# Patient Record
Sex: Female | Born: 1970 | Race: White | Hispanic: No | Marital: Single
Health system: Southern US, Community
[De-identification: ages and names within clinical notes are randomized; demographics above are authoritative.]

## PROBLEM LIST (undated history)

## (undated) ENCOUNTER — Emergency Department: Discharge status: Absent without leave | Payer: Medicare Other

## (undated) DIAGNOSIS — F32A Depression, unspecified: Secondary | ICD-10-CM

## (undated) DIAGNOSIS — Z789 Other specified health status: Secondary | ICD-10-CM

## (undated) DIAGNOSIS — I639 Cerebral infarction, unspecified: Secondary | ICD-10-CM

## (undated) DIAGNOSIS — J45909 Unspecified asthma, uncomplicated: Secondary | ICD-10-CM

## (undated) DIAGNOSIS — J449 Chronic obstructive pulmonary disease, unspecified: Secondary | ICD-10-CM

## (undated) DIAGNOSIS — F17200 Nicotine dependence, unspecified, uncomplicated: Secondary | ICD-10-CM

## (undated) DIAGNOSIS — K219 Gastro-esophageal reflux disease without esophagitis: Secondary | ICD-10-CM

## (undated) DIAGNOSIS — F419 Anxiety disorder, unspecified: Secondary | ICD-10-CM

## (undated) DIAGNOSIS — Z8619 Personal history of other infectious and parasitic diseases: Secondary | ICD-10-CM

## (undated) DIAGNOSIS — R569 Unspecified convulsions: Secondary | ICD-10-CM

## (undated) HISTORY — DX: Personal history of other infectious and parasitic diseases: Z86.19

## (undated) HISTORY — PX: SHOULDER SURGERY: SHX246

## (undated) HISTORY — PX: FOOT SURGERY: SHX648

## (undated) HISTORY — DX: Gastro-esophageal reflux disease without esophagitis: K21.9

## (undated) HISTORY — PX: CHOLECYSTECTOMY: SHX55

---

## 2008-08-18 ENCOUNTER — Emergency Department: Payer: Self-pay | Admitting: Emergency Medicine

## 2008-08-21 ENCOUNTER — Emergency Department: Payer: Self-pay | Admitting: Emergency Medicine

## 2012-03-09 DIAGNOSIS — I6381 Other cerebral infarction due to occlusion or stenosis of small artery: Secondary | ICD-10-CM | POA: Insufficient documentation

## 2012-03-09 DIAGNOSIS — M62469 Contracture of muscle, unspecified lower leg: Secondary | ICD-10-CM | POA: Insufficient documentation

## 2012-04-22 DIAGNOSIS — M216X9 Other acquired deformities of unspecified foot: Secondary | ICD-10-CM | POA: Insufficient documentation

## 2013-03-29 DIAGNOSIS — R9431 Abnormal electrocardiogram [ECG] [EKG]: Secondary | ICD-10-CM | POA: Insufficient documentation

## 2013-09-13 DIAGNOSIS — I7771 Dissection of carotid artery: Secondary | ICD-10-CM | POA: Insufficient documentation

## 2013-09-13 DIAGNOSIS — G40409 Other generalized epilepsy and epileptic syndromes, not intractable, without status epilepticus: Secondary | ICD-10-CM | POA: Insufficient documentation

## 2013-12-26 DIAGNOSIS — M8580 Other specified disorders of bone density and structure, unspecified site: Secondary | ICD-10-CM | POA: Insufficient documentation

## 2014-05-06 DIAGNOSIS — F3162 Bipolar disorder, current episode mixed, moderate: Secondary | ICD-10-CM | POA: Insufficient documentation

## 2014-06-17 DIAGNOSIS — T50902A Poisoning by unspecified drugs, medicaments and biological substances, intentional self-harm, initial encounter: Secondary | ICD-10-CM | POA: Insufficient documentation

## 2015-07-24 DIAGNOSIS — M21372 Foot drop, left foot: Secondary | ICD-10-CM | POA: Insufficient documentation

## 2015-08-19 DIAGNOSIS — F17200 Nicotine dependence, unspecified, uncomplicated: Secondary | ICD-10-CM | POA: Insufficient documentation

## 2015-12-13 DIAGNOSIS — F603 Borderline personality disorder: Secondary | ICD-10-CM | POA: Insufficient documentation

## 2015-12-21 DIAGNOSIS — D509 Iron deficiency anemia, unspecified: Secondary | ICD-10-CM | POA: Insufficient documentation

## 2016-01-14 DIAGNOSIS — I82621 Acute embolism and thrombosis of deep veins of right upper extremity: Secondary | ICD-10-CM | POA: Insufficient documentation

## 2016-01-14 DIAGNOSIS — R911 Solitary pulmonary nodule: Secondary | ICD-10-CM | POA: Insufficient documentation

## 2016-01-14 DIAGNOSIS — E041 Nontoxic single thyroid nodule: Secondary | ICD-10-CM | POA: Insufficient documentation

## 2016-04-12 DIAGNOSIS — I6930 Unspecified sequelae of cerebral infarction: Secondary | ICD-10-CM | POA: Insufficient documentation

## 2016-06-16 DIAGNOSIS — R7303 Prediabetes: Secondary | ICD-10-CM | POA: Insufficient documentation

## 2016-11-13 DIAGNOSIS — I2699 Other pulmonary embolism without acute cor pulmonale: Secondary | ICD-10-CM | POA: Insufficient documentation

## 2016-11-20 DIAGNOSIS — F1420 Cocaine dependence, uncomplicated: Secondary | ICD-10-CM | POA: Insufficient documentation

## 2019-04-21 ENCOUNTER — Other Ambulatory Visit: Payer: Self-pay

## 2019-04-21 ENCOUNTER — Emergency Department
Admission: EM | Admit: 2019-04-21 | Discharge: 2019-04-21 | Disposition: A | Discharge status: Home / Self Care | Payer: Medicare Other | Attending: Emergency Medicine | Admitting: Emergency Medicine

## 2019-04-21 ENCOUNTER — Encounter: Payer: Self-pay | Admitting: Emergency Medicine

## 2019-04-21 ENCOUNTER — Emergency Department: Payer: Medicare Other

## 2019-04-21 DIAGNOSIS — F1290 Cannabis use, unspecified, uncomplicated: Secondary | ICD-10-CM

## 2019-04-21 DIAGNOSIS — Z20828 Contact with and (suspected) exposure to other viral communicable diseases: Secondary | ICD-10-CM | POA: Insufficient documentation

## 2019-04-21 DIAGNOSIS — R569 Unspecified convulsions: Secondary | ICD-10-CM

## 2019-04-21 DIAGNOSIS — F1210 Cannabis abuse, uncomplicated: Secondary | ICD-10-CM | POA: Diagnosis not present

## 2019-04-21 HISTORY — DX: Unspecified convulsions: R56.9

## 2019-04-21 HISTORY — DX: Chronic obstructive pulmonary disease, unspecified: J44.9

## 2019-04-21 LAB — URINE DRUG SCREEN, QUALITATIVE (ARMC ONLY)
Amphetamines, Ur Screen: NOT DETECTED
Barbiturates, Ur Screen: NOT DETECTED
Benzodiazepine, Ur Scrn: NOT DETECTED
Cannabinoid 50 Ng, Ur ~~LOC~~: POSITIVE — AB
Cocaine Metabolite,Ur ~~LOC~~: NOT DETECTED
MDMA (Ecstasy)Ur Screen: NOT DETECTED
Methadone Scn, Ur: NOT DETECTED
Opiate, Ur Screen: NOT DETECTED
Phencyclidine (PCP) Ur S: NOT DETECTED
Tricyclic, Ur Screen: POSITIVE — AB

## 2019-04-21 LAB — CBC WITH DIFFERENTIAL/PLATELET
Abs Immature Granulocytes: 0.04 10*3/uL (ref 0.00–0.07)
Basophils Absolute: 0.1 10*3/uL (ref 0.0–0.1)
Basophils Relative: 1 %
Eosinophils Absolute: 0.2 10*3/uL (ref 0.0–0.5)
Eosinophils Relative: 3 %
HCT: 42 % (ref 36.0–46.0)
Hemoglobin: 14 g/dL (ref 12.0–15.0)
Immature Granulocytes: 0 %
Lymphocytes Relative: 42 %
Lymphs Abs: 3.9 10*3/uL (ref 0.7–4.0)
MCH: 31.3 pg (ref 26.0–34.0)
MCHC: 33.3 g/dL (ref 30.0–36.0)
MCV: 93.8 fL (ref 80.0–100.0)
Monocytes Absolute: 0.8 10*3/uL (ref 0.1–1.0)
Monocytes Relative: 8 %
Neutro Abs: 4.2 10*3/uL (ref 1.7–7.7)
Neutrophils Relative %: 46 %
Platelets: 310 10*3/uL (ref 150–400)
RBC: 4.48 MIL/uL (ref 3.87–5.11)
RDW: 15.7 % — ABNORMAL HIGH (ref 11.5–15.5)
WBC: 9.2 10*3/uL (ref 4.0–10.5)
nRBC: 0 % (ref 0.0–0.2)

## 2019-04-21 LAB — URINALYSIS, COMPLETE (UACMP) WITH MICROSCOPIC
Bacteria, UA: NONE SEEN
Bilirubin Urine: NEGATIVE
Glucose, UA: NEGATIVE mg/dL
Hgb urine dipstick: NEGATIVE
Ketones, ur: NEGATIVE mg/dL
Leukocytes,Ua: NEGATIVE
Nitrite: NEGATIVE
Protein, ur: 30 mg/dL — AB
Specific Gravity, Urine: 1.02 (ref 1.005–1.030)
pH: 6 (ref 5.0–8.0)

## 2019-04-21 LAB — COMPREHENSIVE METABOLIC PANEL
ALT: 10 U/L (ref 0–44)
AST: 22 U/L (ref 15–41)
Albumin: 3.4 g/dL — ABNORMAL LOW (ref 3.5–5.0)
Alkaline Phosphatase: 88 U/L (ref 38–126)
Anion gap: 8 (ref 5–15)
BUN: 12 mg/dL (ref 6–20)
CO2: 27 mmol/L (ref 22–32)
Calcium: 8.8 mg/dL — ABNORMAL LOW (ref 8.9–10.3)
Chloride: 105 mmol/L (ref 98–111)
Creatinine, Ser: 0.9 mg/dL (ref 0.44–1.00)
GFR calc Af Amer: >60 mL/min (ref >60)
GFR calc non Af Amer: >60 mL/min (ref >60)
Glucose, Bld: 117 mg/dL — ABNORMAL HIGH (ref 70–99)
Potassium: 4 mmol/L (ref 3.5–5.1)
Sodium: 140 mmol/L (ref 135–145)
Total Bilirubin: 0.4 mg/dL (ref 0.3–1.2)
Total Protein: 6.9 g/dL (ref 6.5–8.1)

## 2019-04-21 LAB — SARS CORONAVIRUS 2 (TAT 6-24 HRS): SARS Coronavirus 2: NEGATIVE

## 2019-04-21 LAB — SALICYLATE LEVEL: Salicylate Lvl: <7 mg/dL (ref 2.8–30.0)

## 2019-04-21 LAB — VALPROIC ACID LEVEL: Valproic Acid Lvl: <10 ug/mL — ABNORMAL LOW (ref 50.0–100.0)

## 2019-04-21 LAB — ACETAMINOPHEN LEVEL: Acetaminophen (Tylenol), Serum: <10 ug/mL — ABNORMAL LOW (ref 10–30)

## 2019-04-21 LAB — CK: Total CK: 75 U/L (ref 38–234)

## 2019-04-21 LAB — ETHANOL: Alcohol, Ethyl (B): <10 mg/dL (ref <10)

## 2019-04-21 MED ORDER — DIVALPROEX SODIUM 500 MG PO DR TAB: 1000.0000 mg | DELAYED_RELEASE_TABLET | Freq: Two times a day (BID) | ORAL | 0 refills | Status: DC

## 2019-04-21 MED ORDER — SODIUM CHLORIDE 0.9 % IV BOLUS
1000.0000 mL | Freq: Once | INTRAVENOUS | Status: AC
  Administered 2019-04-21: 1000 mL via INTRAVENOUS

## 2019-04-21 MED ORDER — DIVALPROEX SODIUM 500 MG PO DR TAB
1000.0000 mg | DELAYED_RELEASE_TABLET | Freq: Once | ORAL | Status: AC
  Administered 2019-04-21: 12:00:00 1000 mg via ORAL
  Filled 2019-04-21: qty 2

## 2019-04-21 MED ORDER — LORAZEPAM 2 MG/ML IJ SOLN
1.0000 mg | Freq: Once | INTRAMUSCULAR | Status: AC
  Administered 2019-04-21: 01:00:00 1 mg via INTRAVENOUS

## 2019-04-21 MED ORDER — VALPROATE SODIUM 500 MG/5ML IV SOLN
250.0000 mg | Freq: Once | INTRAVENOUS | Status: AC
  Administered 2019-04-21: 250 mg via INTRAVENOUS
  Filled 2019-04-21: qty 2.5

## 2019-04-21 NOTE — ED Notes (Signed)
Pt back from CT, still in a postictal state but does respond to pain.

## 2019-04-21 NOTE — ED Notes (Signed)
This RN and Gregor Hams helped pt to bathroom. Pt also dressed in own clothes. Pt states her mother will be here in an hour to get her. Pt IV left in place at this time while monitoring.

## 2019-04-21 NOTE — ED Notes (Signed)
Pt to CT

## 2019-04-21 NOTE — ED Notes (Signed)
Soon after arrival to the ED pt had tonic clonic seizure lasting a few seconds. PT placed on side with suction and oxygen applied. MD at bedside, meds given.

## 2019-04-21 NOTE — ED Provider Notes (Signed)
[facility] Emergency Department Provider Note   ____________________________________________   First MD Initiated Contact with Patient 04/21/19 0103     (approximate)  I have reviewed the triage vital signs and the nursing notes.   HISTORY  Chief Complaint Fall  Level V caveat: Limited by postictal state  HPI Molly Dennis is a 48 y.o. female brought to the ED via EMS from boardinghouse for possible seizure.  Patient does have seizure disorder and reportedly takes Depakote.  Roommates heard a noise and found patient on the floor.  Patient was awake but confused upon EMS arrival.  On ED arrival patient is more alert and answering questions slowly but appropriately.  Interview was interrupted by patient having tonic-clonic seizure.       Past Medical History Seizures  There are no active problems to display for this patient.    Prior to Admission medications   Medication Sig Start Date End Date Taking? Authorizing Provider  divalproex (DEPAKOTE) 500 MG DR tablet Take 2 tablets (1,000 mg total) by mouth 2 (two) times daily. 04/21/19   Carrie Mew, MD    Allergies Patient has no known allergies.  No family history on file.  Social History Social History   Tobacco Use  . Smoking status: Not on file  Substance Use Topics  . Alcohol use: Not on file  . Drug use: Not on file  Possible EtOH  Review of Systems  Constitutional: No fever/chills Eyes: No visual changes. ENT: No sore throat. Cardiovascular: Denies chest pain. Respiratory: Denies shortness of breath. Gastrointestinal: No abdominal pain.  No nausea, no vomiting.  No diarrhea.  No constipation. Genitourinary: Negative for dysuria. Musculoskeletal: Negative for back pain. Skin: Negative for rash. Neurological: Positive for seizure. Negative for headaches, focal weakness or numbness.   ____________________________________________   PHYSICAL EXAM:  VITAL SIGNS: ED  Triage Vitals  Enc Vitals Group     BP      Pulse      Resp      Temp      Temp src      SpO2      Weight      Height      Head Circumference      Peak Flow      Pain Score      Pain Loc      Pain Edu?      Excl. in [place]?     Constitutional: Alert.  Disheveled appearing and in mild acute distress. Eyes: Conjunctivae are normal. PERRL. EOMI. Head: Atraumatic. Nose: Atraumatic. Mouth/Throat: Mucous membranes are moist.  No dental malocclusion.  Did not bite tongue. Neck: No stridor.  No cervical spine step-offs or deformities. Cardiovascular: Normal rate, regular rhythm. Grossly normal heart sounds.  Good peripheral circulation. Respiratory: Normal respiratory effort.  No retractions. Lungs with scattered rhonchi. Gastrointestinal: Soft and nontender. No distention. No abdominal bruits. No CVA tenderness. Musculoskeletal: No lower extremity tenderness nor edema.  No joint effusions. Neurologic: Alert and oriented to person.  Slurred speech and language. No gross focal neurologic deficits are appreciated. MAEx4. Skin:  Skin is warm, dry and intact. No rash noted. Psychiatric: Mood and affect are normal. Speech and behavior are normal.  ____________________________________________   LABS (all labs ordered are listed, but only abnormal results are displayed)  Labs Reviewed  CBC WITH DIFFERENTIAL/PLATELET - Abnormal; Notable for the following components:      Result Value   RDW 15.7 (*)    All other components  within normal limits  COMPREHENSIVE METABOLIC PANEL - Abnormal; Notable for the following components:   Glucose, Bld 117 (*)    Calcium 8.8 (*)    Albumin 3.4 (*)    All other components within normal limits  ACETAMINOPHEN LEVEL - Abnormal; Notable for the following components:   Acetaminophen (Tylenol), Serum <10 (*)    All other components within normal limits  URINALYSIS, COMPLETE (UACMP) WITH MICROSCOPIC - Abnormal; Notable for the following components:   Color,  Urine YELLOW (*)    APPearance CLEAR (*)    Protein, ur 30 (*)    All other components within normal limits  URINE DRUG SCREEN, QUALITATIVE ([facility] ONLY) - Abnormal; Notable for the following components:   Tricyclic, Ur Screen POSITIVE (*)    Cannabinoid 50 Ng, Ur [place] POSITIVE (*)    All other components within normal limits  VALPROIC ACID LEVEL - Abnormal; Notable for the following components:   Valproic Acid Lvl <10 (*)    All other components within normal limits  SARS CORONAVIRUS 2 (TAT 6-24 HRS)  ETHANOL  SALICYLATE LEVEL  CK   ____________________________________________  EKG  ED ECG REPORT I, SUNG,JADE J, the attending physician, personally viewed and interpreted this ECG.   Date: 04/21/2019  EKG Time: 0117  Rate: 106  Rhythm: sinus tachycardia  Axis: Normal  Intervals: QTC 509  ST&T Change: Nonspecific  ____________________________________________  RADIOLOGY  ED MD interpretation: No ICH or cervical spine injury; no acute cardiopulmonary process  Official radiology report(s): No results found.  ____________________________________________   PROCEDURES  Procedure(s) performed (including Critical Care):  Procedures  CRITICAL CARE Performed by: Irean HongSUNG,JADE J   Total critical care time: 30 minutes  Critical care time was exclusive of separately billable procedures and treating other patients.  Critical care was necessary to treat or prevent imminent or life-threatening deterioration.  Critical care was time spent personally by me on the following activities: development of treatment plan with patient and/or surrogate as well as nursing, discussions with consultants, evaluation of patient's response to treatment, examination of patient, obtaining history from patient or surrogate, ordering and performing treatments and interventions, ordering and review of laboratory studies, ordering and review of radiographic studies, pulse oximetry and re-evaluation of  patient's condition. ____________________________________________   INITIAL IMPRESSION / ASSESSMENT AND PLAN / ED COURSE  As part of my medical decision making, I reviewed the following data within the electronic MEDICAL RECORD NUMBER Nursing notes reviewed and incorporated, Labs reviewed, EKG interpreted, Old chart reviewed, Radiograph reviewed, Notes from prior ED visits and [place] Controlled Substance Database     Rosezetta Schlatterina [name] was evaluated in Emergency Department on 04/22/2019 for the symptoms described in the history of present illness. She was evaluated in the context of the global COVID-19 pandemic, which necessitated consideration that the patient might be at risk for infection with the SARS-CoV-2 virus that causes COVID-19. Institutional protocols and algorithms that pertain to the evaluation of patients at risk for COVID-19 are in a state of rapid change based on information released by regulatory bodies including the CDC and federal and state organizations. These policies and algorithms were followed during the patient's care in the ED.    48 year old female brought from a boardinghouse for seizure versus fall.  Appears postictal on arrival to the emergency department.  Reportedly possible EtOH/substance on board.  Differential diagnosis includes but is not limited to seizure, subtherapeutic Depakote level, ICH, substance-induced, etc.  Will obtain basic toxicological lab work and urine, CT head, chest  x-ray and reassess.  Seizure pads placed.   Clinical Course as of Apr 21 502  Fri Apr 21, 2019  0105 Witnessed tonic-clonic seizure at bedside lasting less than 1 minute.  1 mg IV Ativan administered.   [JS]  0151 Patient asleep, going to CT scan.   [JS]  [id number] Patient resting in no acute distress.  Vital signs stable.   [JS]  [id number] Patient sleeping in no acute distress.  Awakens to voice but falls back asleep.  Anticipate discharge home once patient is awake.   [JS]    Clinical Course  User Index [JS] Irean Hong, MD     ____________________________________________   FINAL CLINICAL IMPRESSION(S) / ED DIAGNOSES  Final diagnoses:  Seizure (HCC)  Marijuana use     ED Discharge Orders         Ordered    divalproex (DEPAKOTE) 500 MG DR tablet  2 times daily     04/21/19 1257           Note:  This document was prepared using Dragon voice recognition software and may include unintentional dictation errors.   Irean Hong, MD 04/22/19 [phone]

## 2019-04-21 NOTE — ED Notes (Signed)
Pt noted to have a tonic clonic seizure immediately after arrival. Lasted less then a min, EDP at bedside, meds given per MD order.

## 2019-04-21 NOTE — ED Provider Notes (Signed)
Procedures  Clinical Course as of Apr 20 1256  Fri Apr 21, 2019  0105 Witnessed tonic-clonic seizure at bedside lasting less than 1 minute.  1 mg IV Ativan administered.   [JS]  2751 Patient asleep, going to CT scan.   [JS]  [id number] Patient resting in no acute distress.  Vital signs stable.   [JS]  [id number] Patient sleeping in no acute distress.  Awakens to voice but falls back asleep.  Anticipate discharge home once patient is awake.   [JS]    Clinical Course User Index [JS] Paulette Blanch, MD    ----------------------------------------- 12:57 PM on 04/21/2019 -----------------------------------------  Patient is awake, oriented, interactive and answering questions appropriately.  She confirms that she had run out of her Depakote quite a while ago.  She normally takes 1000 mg 2 times a day.  Denies any acute symptoms currently, comfortable with discharge home.  I will give her an additional oral dose of her Depakote for now and write a new prescription.  Referral information for primary care.  Medically stable at this time   Carrie Mew, MD 04/21/19 1258

## 2019-04-21 NOTE — ED Notes (Signed)
Report given to Dee RN.

## 2019-04-21 NOTE — ED Notes (Signed)
This RN and Gregor Hams introduced self to pt. Pt given phone to call family member. Pt denies any needs at this time. Will continue to monitor.

## 2019-04-21 NOTE — ED Notes (Signed)
Pt given phone for personal use at this time

## 2019-04-21 NOTE — Discharge Instructions (Signed)
Take your medicine daily as directed by your doctor.  Return to the ER for worsening symptoms, persistent vomiting, difficulty breathing or other concerns.

## 2019-04-21 NOTE — ED Triage Notes (Addendum)
PT brought in by EMS from boarding house. Roomates heard a noise and found her on the floor. PT was awake but confused. She denies any pain or recollection of incident. Per EMS pt was confused and slow to answer questions upon their arrival and is more alert upon arrival to ED. 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 Unable to due full neuro assessment due to slurred speech and sluggish responses. Unsure of baseline, pt does have hx of seizures that she takes depakote for.

## 2019-06-12 ENCOUNTER — Emergency Department: Payer: Medicare Other

## 2019-06-12 ENCOUNTER — Encounter: Payer: Self-pay | Admitting: Emergency Medicine

## 2019-06-12 ENCOUNTER — Other Ambulatory Visit: Payer: Self-pay

## 2019-06-12 ENCOUNTER — Emergency Department
Admission: EM | Admit: 2019-06-12 | Discharge: 2019-06-13 | Disposition: A | Discharge status: Home / Self Care | Payer: Medicare Other | Attending: Student in an Organized Health Care Education/Training Program | Admitting: Student in an Organized Health Care Education/Training Program

## 2019-06-12 DIAGNOSIS — F10129 Alcohol abuse with intoxication, unspecified: Secondary | ICD-10-CM | POA: Diagnosis not present

## 2019-06-12 DIAGNOSIS — J449 Chronic obstructive pulmonary disease, unspecified: Secondary | ICD-10-CM | POA: Insufficient documentation

## 2019-06-12 DIAGNOSIS — Z79899 Other long term (current) drug therapy: Secondary | ICD-10-CM | POA: Insufficient documentation

## 2019-06-12 DIAGNOSIS — S0990XA Unspecified injury of head, initial encounter: Secondary | ICD-10-CM | POA: Diagnosis not present

## 2019-06-12 DIAGNOSIS — E876 Hypokalemia: Secondary | ICD-10-CM | POA: Insufficient documentation

## 2019-06-12 DIAGNOSIS — F329 Major depressive disorder, single episode, unspecified: Secondary | ICD-10-CM | POA: Diagnosis not present

## 2019-06-12 DIAGNOSIS — F1010 Alcohol abuse, uncomplicated: Secondary | ICD-10-CM

## 2019-06-12 DIAGNOSIS — Y929 Unspecified place or not applicable: Secondary | ICD-10-CM | POA: Diagnosis not present

## 2019-06-12 DIAGNOSIS — R45851 Suicidal ideations: Secondary | ICD-10-CM | POA: Diagnosis not present

## 2019-06-12 DIAGNOSIS — Y939 Activity, unspecified: Secondary | ICD-10-CM | POA: Insufficient documentation

## 2019-06-12 DIAGNOSIS — Y999 Unspecified external cause status: Secondary | ICD-10-CM | POA: Insufficient documentation

## 2019-06-12 DIAGNOSIS — W010XXA Fall on same level from slipping, tripping and stumbling without subsequent striking against object, initial encounter: Secondary | ICD-10-CM | POA: Insufficient documentation

## 2019-06-12 LAB — CBC
HCT: 49.3 % — ABNORMAL HIGH (ref 36.0–46.0)
Hemoglobin: 16.7 g/dL — ABNORMAL HIGH (ref 12.0–15.0)
MCH: 30.9 pg (ref 26.0–34.0)
MCHC: 33.9 g/dL (ref 30.0–36.0)
MCV: 91.1 fL (ref 80.0–100.0)
Platelets: 589 10*3/uL — ABNORMAL HIGH (ref 150–400)
RBC: 5.41 MIL/uL — ABNORMAL HIGH (ref 3.87–5.11)
RDW: 12.8 % (ref 11.5–15.5)
WBC: 10.2 10*3/uL (ref 4.0–10.5)
nRBC: 0 % (ref 0.0–0.2)

## 2019-06-12 LAB — COMPREHENSIVE METABOLIC PANEL
ALT: 12 U/L (ref 0–44)
AST: 17 U/L (ref 15–41)
Albumin: 3.5 g/dL (ref 3.5–5.0)
Alkaline Phosphatase: 91 U/L (ref 38–126)
Anion gap: 16 — ABNORMAL HIGH (ref 5–15)
BUN: 9 mg/dL (ref 6–20)
CO2: 26 mmol/L (ref 22–32)
Calcium: 8.7 mg/dL — ABNORMAL LOW (ref 8.9–10.3)
Chloride: 94 mmol/L — ABNORMAL LOW (ref 98–111)
Creatinine, Ser: 1.01 mg/dL — ABNORMAL HIGH (ref 0.44–1.00)
GFR calc Af Amer: >60 mL/min (ref >60)
GFR calc non Af Amer: >60 mL/min (ref >60)
Glucose, Bld: 89 mg/dL (ref 70–99)
Potassium: 2.6 mmol/L — CL (ref 3.5–5.1)
Sodium: 136 mmol/L (ref 135–145)
Total Bilirubin: 0.5 mg/dL (ref 0.3–1.2)
Total Protein: 7.9 g/dL (ref 6.5–8.1)

## 2019-06-12 LAB — ETHANOL: Alcohol, Ethyl (B): 147 mg/dL — ABNORMAL HIGH (ref <10)

## 2019-06-12 MED ORDER — VITAMIN B-1 100 MG PO TABS
100.0000 mg | ORAL_TABLET | Freq: Every day | ORAL | Status: DC
  Administered 2019-06-13: 100 mg via ORAL

## 2019-06-12 MED ORDER — MAGNESIUM SULFATE 2 GM/50ML IV SOLN
2.0000 g | Freq: Once | INTRAVENOUS | Status: AC
  Administered 2019-06-12: 2 g via INTRAVENOUS
  Filled 2019-06-12: qty 50

## 2019-06-12 MED ORDER — LORAZEPAM 2 MG/ML IJ SOLN
0.0000 mg | Freq: Four times a day (QID) | INTRAMUSCULAR | Status: DC
  Administered 2019-06-13: 1 mg via INTRAVENOUS

## 2019-06-12 MED ORDER — LORAZEPAM 2 MG/ML IJ SOLN: 0.0000 mg | Freq: Two times a day (BID) | INTRAMUSCULAR | Status: DC

## 2019-06-12 MED ORDER — THIAMINE HCL 100 MG/ML IJ SOLN: 100.0000 mg | Freq: Every day | INTRAMUSCULAR | Status: DC

## 2019-06-12 MED ORDER — SODIUM CHLORIDE 0.9 % IV SOLN
INTRAVENOUS | Status: DC | PRN
  Administered 2019-06-12: 1000 mL via INTRAVENOUS

## 2019-06-12 MED ORDER — POTASSIUM CHLORIDE 10 MEQ/100ML IV SOLN
10.0000 meq | Freq: Once | INTRAVENOUS | Status: AC
  Administered 2019-06-13: 10 meq via INTRAVENOUS
  Filled 2019-06-12: qty 100

## 2019-06-12 MED ORDER — LORAZEPAM 2 MG PO TABS
0.0000 mg | ORAL_TABLET | Freq: Four times a day (QID) | ORAL | Status: DC
  Administered 2019-06-12: 4 mg via ORAL
  Filled 2019-06-12: qty 2

## 2019-06-12 MED ORDER — POTASSIUM CHLORIDE CRYS ER 20 MEQ PO TBCR
40.0000 meq | EXTENDED_RELEASE_TABLET | Freq: Once | ORAL | Status: AC
  Administered 2019-06-12: 40 meq via ORAL
  Filled 2019-06-12: qty 2

## 2019-06-12 MED ORDER — ONDANSETRON 4 MG PO TBDP
4.0000 mg | ORAL_TABLET | Freq: Once | ORAL | Status: AC
  Administered 2019-06-12: 4 mg via ORAL
  Filled 2019-06-12: qty 1

## 2019-06-12 MED ORDER — POTASSIUM CHLORIDE CRYS ER 20 MEQ PO TBCR
40.0000 meq | EXTENDED_RELEASE_TABLET | Freq: Two times a day (BID) | ORAL | Status: DC
  Administered 2019-06-12 – 2019-06-13 (×2): 40 meq via ORAL
  Filled 2019-06-12: qty 2

## 2019-06-12 MED ORDER — LORAZEPAM 2 MG PO TABS: 0.0000 mg | ORAL_TABLET | Freq: Two times a day (BID) | ORAL | Status: DC

## 2019-06-12 NOTE — ED Triage Notes (Signed)
History of alcohol and crack abuse. States has been having suicidal ideation, states would overdose on pills.

## 2019-06-12 NOTE — ED Notes (Signed)
;  pt to batrhroom to vomit for 3rd time

## 2019-06-12 NOTE — ED Notes (Signed)
To bathroom to vomit

## 2019-06-12 NOTE — ED Provider Notes (Signed)
[facility] Provider Note    First MD Initiated Contact with Patient 06/12/19 1722     (approximate)  I have reviewed the triage vital signs and the nursing notes.   HISTORY  Chief Complaint Suicidal    HPI Molly Dennis is a 48 y.o. female below listed past medical history presents to the ER with concern for suicidal ideation.  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  States that she did have a fall several days ago resulting in head injury.  Is not been evaluated for that.  States she plans to overdose of pills.  States she no longer wants to leave.    Past Medical History:  Diagnosis Date  . COPD (chronic obstructive pulmonary disease) ([place])   . Seizures ([place])    No family history on file. History reviewed. No pertinent surgical history. Patient Active Problem List   Diagnosis Date Noted  . ETOH abuse 06/12/2019      Prior to Admission medications   Medication Sig Start Date End Date Taking? Authorizing Provider  albuterol (VENTOLIN HFA) 108 (90 Base) MCG/ACT inhaler Inhale 2 puffs into the lungs every 6 (six) hours as needed for wheezing. 02/09/19  Yes [provider]  divalproex (DEPAKOTE ER) 250 MG 24 hr tablet Take 1,250 mg by mouth 2 (two) times daily. 04/21/19  Yes [provider]  fluticasone furoate-vilanterol (BREO ELLIPTA) 100-25 MCG/INH AEPB Inhale 1 puff into the lungs daily. 02/09/19  Yes [provider]  gabapentin (NEURONTIN) 300 MG capsule Take 600 mg by mouth 2 (two) times daily. 02/09/19 02/09/20 Yes [provider]  hydrOXYzine (VISTARIL) 25 MG capsule Take 25 mg by mouth 2 (two) times daily. 06/06/19  Yes [provider]  naloxone (NARCAN) 4 MG/0.1ML LIQD nasal spray kit Place 1 spray into the nose once. 11/16/17 11/16/19 Yes [provider]  omeprazole (PRILOSEC) 20 MG capsule Take 20 mg by mouth 2 (two) times daily  before a meal.  05/22/19  Yes [provider]  QUEtiapine (SEROQUEL) 300 MG tablet Take 600 mg by mouth at bedtime. 02/21/19  Yes [provider]    Allergies Patient has no known allergies.    Social History Social History   Tobacco Use  . Smoking status: Not on file  Substance Use Topics  . Alcohol use: Not on file  . Drug use: Not on file    Review of Systems Patient denies headaches, rhinorrhea, blurry vision, numbness, shortness of breath, chest pain, edema, cough, abdominal pain, nausea, vomiting, diarrhea, dysuria, fevers, rashes or hallucinations unless otherwise stated above in HPI. ____________________________________________   PHYSICAL EXAM:  VITAL SIGNS: Vitals:   06/12/19 1713 06/12/19 2059  BP: (!) 114/95 (!) 123/97  Pulse: (!) 105 92  Resp: 20 (!) 22  Temp: 98.2 F (36.8 C)   SpO2: 96% 96%    Constitutional: Alert, disheveled and intoxicated appearings.  Eyes: Conjunctivae are normal.  EOMI Head: left periorbital contusion and ecchymosis, no proptosis Nose: No congestion/rhinnorhea. Mouth/Throat: Mucous membranes are moist.   Neck: No stridor. Painless ROM.  Cardiovascular: Normal rate, regular rhythm. Grossly normal heart sounds.  Good peripheral circulation. Respiratory: Normal respiratory effort.  No retractions. Lungs CTAB. Gastrointestinal: Soft and nontender. No distention. No abdominal bruits. No CVA tenderness. Genitourinary: deferred Musculoskeletal: No lower extremity tenderness nor edema.  No joint effusions. Neurologic:  Normal speech and language.  Chronic LUE weakness. No facial  droop Skin:  Skin is warm, dry and intact. No rash noted. Psychiatric:calm and cooperative, + SI____________________________________________   LABS (all labs ordered are listed, but only abnormal results are displayed)  Results for orders placed or performed during the hospital encounter of 06/12/19 (from the past 24 hour(s))  Comprehensive  metabolic panel     Status: Abnormal   Collection Time: 06/12/19  5:18 PM  Result Value Ref Range   Sodium 136 135 - 145 mmol/L   Potassium 2.6 (LL) 3.5 - 5.1 mmol/L   Chloride 94 (L) 98 - 111 mmol/L   CO2 26 22 - 32 mmol/L   Glucose, Bld 89 70 - 99 mg/dL   BUN 9 6 - 20 mg/dL   Creatinine, Ser 1.01 (H) 0.44 - 1.00 mg/dL   Calcium 8.7 (L) 8.9 - 10.3 mg/dL   Total Protein 7.9 6.5 - 8.1 g/dL   Albumin 3.5 3.5 - 5.0 g/dL   AST 17 15 - 41 U/L   ALT 12 0 - 44 U/L   Alkaline Phosphatase 91 38 - 126 U/L   Total Bilirubin 0.5 0.3 - 1.2 mg/dL   GFR calc non Af Amer >60 >60 mL/min   GFR calc Af Amer >60 >60 mL/min   Anion gap 16 (H) 5 - 15  Ethanol     Status: Abnormal   Collection Time: 06/12/19  5:18 PM  Result Value Ref Range   Alcohol, Ethyl (B) 147 (H) <10 mg/dL  cbc     Status: Abnormal   Collection Time: 06/12/19  5:18 PM  Result Value Ref Range   WBC 10.2 4.0 - 10.5 K/uL   RBC 5.41 (H) 3.87 - 5.11 MIL/uL   Hemoglobin 16.7 (H) 12.0 - 15.0 g/dL   HCT 49.3 (H) 36.0 - 46.0 %   MCV 91.1 80.0 - 100.0 fL   MCH 30.9 26.0 - 34.0 pg   MCHC 33.9 30.0 - 36.0 g/dL   RDW 12.8 11.5 - 15.5 %   Platelets 589 (H) 150 - 400 K/uL   nRBC 0.0 0.0 - 0.2 %   ____________________________________________ ____________________________________________  RADIOLOGY  I personally reviewed all radiographic images ordered to evaluate for the above acute complaints and reviewed radiology reports and findings.  These findings were personally discussed with the patient.  Please see medical record for radiology report.  ____________________________________________   PROCEDURES  Procedure(s) performed:  Procedures    Critical Care performed: no ____________________________________________   INITIAL IMPRESSION / ASSESSMENT AND PLAN / ED COURSE  Pertinent labs & imaging results that were available during my care of the patient were reviewed by me and considered in my medical decision making (see  chart for details).   DDX: sdh, edh, sah, fracture, Psychosis, delirium, medication effect, noncompliance, polysubstance abuse, Si, Hi, depression   Molly Dennis is a 48 y.o. who presents to the ED with for evaluation of SI.  Patient has psych history of .  Laboratory testing was ordered to evaluation for underlying electrolyte derangement or signs of underlying organic pathology to explain today's presentation.  Based on history and physical and laboratory evaluation, it appears that the patient's presentation is 2/2 underlying psychiatric disorder and will require further evaluation and management by inpatient psychiatry.  Patient was  made an IVC due to etoh abuse and SI.  Will replete K  Will place on CIWA.  Disposition pending psychiatric evaluation.       The patient was evaluated in Emergency Department today for the symptoms described in  the history of present illness. He/she was evaluated in the context of the global COVID-19 pandemic, which necessitated consideration that the patient might be at risk for infection with the SARS-CoV-2 virus that causes COVID-19. Institutional protocols and algorithms that pertain to the evaluation of patients at risk for COVID-19 are in a state of rapid change based on information released by regulatory bodies including the CDC and federal and state organizations. These policies and algorithms were followed during the patient's care in the ED.  As part of my medical decision making, I reviewed the following data within the [place] notes reviewed and incorporated, Labs reviewed, notes from prior ED visits and [place] Controlled Substance Database   ____________________________________________   FINAL CLINICAL IMPRESSION(S) / ED DIAGNOSES  Final diagnoses:  Suicidal ideation  Alcohol abuse  Low serum potassium      NEW MEDICATIONS STARTED DURING THIS VISIT:  New Prescriptions   No medications on file     Note:  This  document was prepared using Dragon voice recognition software and may include unintentional dictation errors.    Merlyn Lot, MD 06/12/19 2237

## 2019-06-12 NOTE — Consult Note (Signed)
[facility] Consult   Reason for Consult: Patient having suicidal ideations Referring Physician: Dr. Quentin Cornwall  Patient Identification: Molly Dennis MRN:  [id number] Principal Diagnosis: <principal problem not specified> Diagnosis:  Active Problems:   ETOH abuse   Total Time spent with patient: 45 minutes  Subjective:   Molly Dennis is a 48 y.o. female patient admitted with thought of suicide.  HPI:  During evaluation Molly Dennis is alert/oriented x 4; calm/cooperative; and mood is congruent with affect.  He/She does not appear to be responding to internal/external stimuli or delusional thoughts.  Patient denies suicidal/self-harm/homicidal ideation, psychosis, and paranoia.  Patient answered question appropriately.    The patient is lying in bed upon approach.  Appearance is disheveled and speech is pressured.  Patient mood is depressed and affect is congruent to mood.  Patient becomes tearful when asked what brings her in tonight.  Although patient is engaging in interview process, pt is obviously inebriated. Patient stated that she came into the hospital because her "drinking is too much".  She states that she has lost everything as a result of her drinking.  She states that in addition to drinking a pint of brown liquor a day she also engages is drug use, crack once a week when she gets paid.  The type of payment she receives is undetermined by Probation officer at this time.     Patient stated that she sees a psychiatrist and a therapist regularly.  She states that she is med compliant.  She takes seroquel and depakote but  are unsure of the doses at this time.    She currently lives in a boarding house, which she states is the reason she has reverted back to using drugs and drinking alcohol.  She admits to past SA.  She says she had several through out the years but the last one was 2 years ago by was of overdose.  She denies suicidal and homicidal ideation at this time.  She just really  wants to stop drinking "drinking has ruined my life, I had everything before, a two bedroom apartment, a good job, everything".    Patient is followed by an act team and says they are helpful for the most part.  Patient has a court date in two weeks due to an altercation with a woman that resides at the boarding house.     Past Psychiatric History: Patient has a dx of Bipolar disorder  Risk to Self:   Risk to Others:   Prior Inpatient Therapy:   Prior Outpatient Therapy:    Past Medical History:  Past Medical History:  Diagnosis Date  . COPD (chronic obstructive pulmonary disease) ([place])   . Seizures ([place])    History reviewed. No pertinent surgical history. Family History: No family history on file. Family Psychiatric  History: yes. Patient states that her sister has mental illness but unsure of which one. Social History:  Social History   Substance and Sexual Activity  Alcohol Use None     Social History   Substance and Sexual Activity  Drug Use Not on file    Social History   Socioeconomic History  . Marital status: Single    Spouse name: Not on file  . Number of children: Not on file  . Years of education: Not on file  . Highest education level: Not on file  Occupational History  . Not on file  Social Needs  . Financial resource strain: Not on file  . Food insecurity  Worry: Not on file    Inability: Not on file  . Transportation needs    Medical: Not on file    Non-medical: Not on file  Tobacco Use  . Smoking status: Not on file  Substance and Sexual Activity  . Alcohol use: Not on file  . Drug use: Not on file  . Sexual activity: Not on file  Lifestyle  . Physical activity    Days per week: Not on file    Minutes per session: Not on file  . Stress: Not on file  Relationships  . Social Herbalist on phone: Not on file    Gets together: Not on file    Attends religious service: Not on file    Active member of club or organization: Not on  file    Attends meetings of clubs or organizations: Not on file    Relationship status: Not on file  Other Topics Concern  . Not on file  Social History Narrative  . Not on file   Additional Social History:    Allergies:  No Known Allergies  Labs:  Results for orders placed or performed during the hospital encounter of 06/12/19 (from the past 48 hour(s))  Comprehensive metabolic panel     Status: Abnormal   Collection Time: 06/12/19  5:18 PM  Result Value Ref Range   Sodium 136 135 - 145 mmol/L   Potassium 2.6 (LL) 3.5 - 5.1 mmol/L    Comment: CRITICAL RESULT CALLED TO, READ BACK BY AND VERIFIED WITH AMY BOISVERT AT 1827 06/12/2019  TFK    Chloride 94 (L) 98 - 111 mmol/L   CO2 26 22 - 32 mmol/L   Glucose, Bld 89 70 - 99 mg/dL   BUN 9 6 - 20 mg/dL   Creatinine, Ser 1.01 (H) 0.44 - 1.00 mg/dL   Calcium 8.7 (L) 8.9 - 10.3 mg/dL   Total Protein 7.9 6.5 - 8.1 g/dL   Albumin 3.5 3.5 - 5.0 g/dL   AST 17 15 - 41 U/L   ALT 12 0 - 44 U/L   Alkaline Phosphatase 91 38 - 126 U/L   Total Bilirubin 0.5 0.3 - 1.2 mg/dL   GFR calc non Af Amer >60 >60 mL/min   GFR calc Af Amer >60 >60 mL/min   Anion gap 16 (H) 5 - 15    Comment: Performed at [facility], [address] [place], [place] [address]  Ethanol     Status: Abnormal   Collection Time: 06/12/19  5:18 PM  Result Value Ref Range   Alcohol, Ethyl (B) 147 (H) <10 mg/dL    Comment: (NOTE) Lowest detectable limit for serum alcohol is 10 mg/dL. For medical purposes only. Performed at [facility], [address] [place], [place] [address]   cbc     Status: Abnormal   Collection Time: 06/12/19  5:18 PM  Result Value Ref Range   WBC 10.2 4.0 - 10.5 K/uL   RBC 5.41 (H) 3.87 - 5.11 MIL/uL   Hemoglobin 16.7 (H) 12.0 - 15.0 g/dL   HCT 49.3 (H) 36.0 - 46.0 %   MCV 91.1 80.0 - 100.0 fL   MCH 30.9 26.0 - 34.0 pg   MCHC 33.9 30.0 - 36.0 g/dL   RDW 12.8 11.5 - 15.5 %   Platelets 589 (H) 150 - 400 K/uL   nRBC  0.0 0.0 - 0.2 %    Comment: Performed at [facility], [address] [place], [place] [address]    Current Facility-Administered Medications  Medication Dose Route Frequency Provider Last Rate Last Dose  . LORazepam (ATIVAN) injection 0-4 mg  0-4 mg Intravenous Q6H Merlyn Lot, MD       Or  . LORazepam (ATIVAN) tablet 0-4 mg  0-4 mg Oral Q6H Merlyn Lot, MD   4 mg at 06/12/19 2143  . [START ON 06/15/2019] LORazepam (ATIVAN) injection 0-4 mg  0-4 mg Intravenous Q12H Merlyn Lot, MD       Or  . Derrill Memo ON 06/15/2019] LORazepam (ATIVAN) tablet 0-4 mg  0-4 mg Oral Q12H Merlyn Lot, MD      . potassium chloride SA (KLOR-CON) CR tablet 40 mEq  40 mEq Oral BID Merlyn Lot, MD   40 mEq at 06/12/19 2143  . thiamine (VITAMIN B-1) tablet 100 mg  100 mg Oral Daily Merlyn Lot, MD       Or  . thiamine (B-1) injection 100 mg  100 mg Intravenous Daily Merlyn Lot, MD       Current Outpatient Medications  Medication Sig Dispense Refill  . albuterol (VENTOLIN HFA) 108 (90 Base) MCG/ACT inhaler Inhale 2 puffs into the lungs every 6 (six) hours as needed for wheezing.    . divalproex (DEPAKOTE ER) 250 MG 24 hr tablet Take 1,250 mg by mouth 2 (two) times daily.    . fluticasone furoate-vilanterol (BREO ELLIPTA) 100-25 MCG/INH AEPB Inhale 1 puff into the lungs daily.    Marland Kitchen gabapentin (NEURONTIN) 300 MG capsule Take 600 mg by mouth 2 (two) times daily.    . hydrOXYzine (VISTARIL) 25 MG capsule Take 25 mg by mouth 2 (two) times daily.    . naloxone (NARCAN) 4 MG/0.1ML LIQD nasal spray kit Place 1 spray into the nose once.    Marland Kitchen omeprazole (PRILOSEC) 20 MG capsule Take 20 mg by mouth 2 (two) times daily before a meal.     . QUEtiapine (SEROQUEL) 300 MG tablet Take 600 mg by mouth at bedtime.      Musculoskeletal: Strength & Muscle Tone: within normal limits Gait & Station: unknown, patient was laying down during the assessment. Patient leans:  N/A  Psychiatric Specialty Exam: Physical Exam  Nursing note and vitals reviewed. Constitutional: She is oriented to person, place, and time. She appears well-developed.  HENT:  Head: Normocephalic.  Eyes: Pupils are equal, round, and reactive to light.  Neck: Normal range of motion.  Respiratory: Effort normal.  Musculoskeletal: Normal range of motion.  Neurological: She is alert and oriented to person, place, and time.  Skin: Skin is warm and dry.  Psychiatric: Thought content normal. Her mood appears anxious. Her speech is rapid and/or pressured. She is hyperactive. Cognition and memory are normal. She expresses impulsivity and inappropriate judgment. She exhibits a depressed mood.    Review of Systems  Psychiatric/Behavioral: Positive for depression and substance abuse. Negative for hallucinations. The patient is nervous/anxious and has insomnia.   All other systems reviewed and are negative.   Blood pressure (!) 123/97, pulse 92, temperature 98.2 F (36.8 C), temperature source Oral, resp. rate (!) 22, height 5' 5.5" (1.664 m), weight 77.1 kg, SpO2 96 %.Body mass index is 27.86 kg/m.  General Appearance: Disheveled  Eye Contact:  Good  Speech:  Pressured  Volume:  Normal  Mood:  Anxious, Depressed and Hopeless  Affect:  Congruent  Thought Process:  Coherent and Descriptions of Associations: Intact  Orientation:  Full (Time, Place, and Person)  Thought Content:  Logical  Suicidal Thoughts:  No  Homicidal Thoughts:  No  Memory:  Immediate;   Good  Judgement:  Fair  Insight:  Fair  Psychomotor Activity:  Normal  Concentration:  Concentration: Fair  Recall:  Good  Fund of Knowledge:  Good  Language:  Good  Akathisia:  NA  Handed:  Right  AIMS (if indicated):     Assets:  Communication Skills Desire for Improvement  ADL's:  Intact  Cognition:  WNL  Sleep: Patient is not sleeping. Sleeps less than 3 hours a day.       Treatment Plan Summary: Daily contact with  patient to assess and evaluate symptoms and progress in treatment, Medication management and Plan Patient needs to be reassesd in the morning, due to intoxication  Disposition: No evidence of imminent risk to self or others at present.    Deloria Lair, NP 06/12/2019 10:14 PM

## 2019-06-12 NOTE — ED Notes (Signed)
Pt ambulated to bathroom.  Gave ua sample

## 2019-06-12 NOTE — ED Notes (Signed)
Pink pants,gray shoes,white socks,white t-shirt,and hair band.

## 2019-06-12 NOTE — ED Notes (Signed)
[facility] PENDING  CONSULT ?

## 2019-06-12 NOTE — ED Notes (Signed)
Pt requesting pain meds for stomach pain which is chronic. Pt states frequent vomiting whether ingesting ETOH or not.

## 2019-06-13 DIAGNOSIS — F10129 Alcohol abuse with intoxication, unspecified: Secondary | ICD-10-CM | POA: Diagnosis not present

## 2019-06-13 DIAGNOSIS — F1010 Alcohol abuse, uncomplicated: Secondary | ICD-10-CM

## 2019-06-13 LAB — URINE DRUG SCREEN, QUALITATIVE (ARMC ONLY)
Amphetamines, Ur Screen: NOT DETECTED
Barbiturates, Ur Screen: NOT DETECTED
Benzodiazepine, Ur Scrn: NOT DETECTED
Cannabinoid 50 Ng, Ur ~~LOC~~: POSITIVE — AB
Cocaine Metabolite,Ur ~~LOC~~: POSITIVE — AB
MDMA (Ecstasy)Ur Screen: NOT DETECTED
Methadone Scn, Ur: NOT DETECTED
Opiate, Ur Screen: NOT DETECTED
Phencyclidine (PCP) Ur S: NOT DETECTED
Tricyclic, Ur Screen: NOT DETECTED

## 2019-06-13 LAB — BASIC METABOLIC PANEL
Anion gap: 12 (ref 5–15)
BUN: 10 mg/dL (ref 6–20)
CO2: 30 mmol/L (ref 22–32)
Calcium: 8.3 mg/dL — ABNORMAL LOW (ref 8.9–10.3)
Chloride: 98 mmol/L (ref 98–111)
Creatinine, Ser: 1.06 mg/dL — ABNORMAL HIGH (ref 0.44–1.00)
GFR calc Af Amer: >60 mL/min (ref >60)
GFR calc non Af Amer: >60 mL/min (ref >60)
Glucose, Bld: 91 mg/dL (ref 70–99)
Potassium: 3.3 mmol/L — ABNORMAL LOW (ref 3.5–5.1)
Sodium: 140 mmol/L (ref 135–145)

## 2019-06-13 LAB — PREGNANCY, URINE: Preg Test, Ur: NEGATIVE

## 2019-06-13 NOTE — ED Provider Notes (Signed)
-----------------------------------------   9:01 AM on 06/13/2019 -----------------------------------------  Blood pressure (!) 123/97, pulse 92, temperature 98.2 F (36.8 C), temperature source Oral, resp. rate (!) 22, height 5' 5.5" (1.664 m), weight 77.1 kg, SpO2 96 %.  The patient is calm and cooperative at this time.  There have been no acute events since the last update.  Awaiting disposition plan from Behavioral Medicine team.  Patient's repeat BMP shows significant improvement in her hypokalemia.  She is now medically cleared and pending reevaluation by psychiatry.  Patient reevaluated by psychiatry and deemed appropriate for outpatient mission.  Now that she is clinically sober, she no longer represents a threat to herself or others.  Patient reevaluated and denies any SI or HI.  Counseled to return to the ED for new or worsening symptoms, patient agrees with plan.   Blake Divine, MD 06/13/19 [phone]

## 2019-06-13 NOTE — ED Notes (Signed)
Hourly rounding reveals patient sleeping in room. No complaints, stable, in no acute distress. Q15 minute rounds and monitoring via Security to continue. 

## 2019-06-13 NOTE — ED Notes (Signed)
Patient discharged home, patient received discharge papers. Patient received belongings and verbalized he has received all of his belongings. Patient appropriate and cooperative, Denies SI/HI AVH. Vital signs taken. NAD noted. 

## 2019-06-13 NOTE — ED Provider Notes (Signed)
-----------------------------------------   12:06 AM on 06/13/2019 -----------------------------------------   Blood pressure (!) 123/97, pulse 92, temperature 98.2 F (36.8 C), temperature source Oral, resp. rate (!) 22, height 5' 5.5" (1.664 m), weight 77.1 kg, SpO2 96 %.  The patient is calm and cooperative at this time.  There have been no acute events since the last update.  Awaiting disposition plan from Behavioral Medicine and/or Social Work team(s).    Nena Polio, MD 06/13/19 [phone]

## 2019-06-13 NOTE — Consult Note (Signed)
[facility] Consult   Reason for Consult: Patient having suicidal ideations Referring Physician: Dr. Quentin Cornwall  Patient Identification: Nohealani Medinger MRN:  [id number] Principal Diagnosis: <principal problem not specified> Diagnosis:  Active Problems:   Alcohol abuse        Patient seen overnight by NP.  Original note as below". Patient reevaluated on 06/13/2019 at approximately 11:30 AM.  At this time patient denies any suicidal homicidal or manic symptoms.  Patient denies any psychosis.  Patient states that she was drunk last night and no longer means the things that she said.  Patient is willing to follow-up with psychiatric care on an outpatient basis.  Patient is followed by the act team with whom she met with last week.     At this point patient does not meet criteria for inpatient hospitalization.  IVC rescinded.  Patient will be discharged to community.      " Total Time spent with patient: 45 minutes  Subjective:   Jovon Streetman is a 48 y.o. female patient admitted with thought of suicide.  HPI:  During evaluation Claudell Rhody is alert/oriented x 4; calm/cooperative; and mood is congruent with affect.  He/She does not appear to be responding to internal/external stimuli or delusional thoughts.  Patient denies suicidal/self-harm/homicidal ideation, psychosis, and paranoia.  Patient answered question appropriately.    The patient is lying in bed upon approach.  Appearance is disheveled and speech is pressured.  Patient mood is depressed and affect is congruent to mood.  Patient becomes tearful when asked what brings her in tonight.  Although patient is engaging in interview process, pt is obviously inebriated. Patient stated that she came into the hospital because her "drinking is too much".  She states that she has lost everything as a result of her drinking.  She states that in addition to drinking a pint of brown liquor a day she also engages is drug use, crack once a  week when she gets paid.  The type of payment she receives is undetermined by Probation officer at this time.     Patient stated that she sees a psychiatrist and a therapist regularly.  She states that she is med compliant.  She takes seroquel and depakote but  are unsure of the doses at this time.    She currently lives in a boarding house, which she states is the reason she has reverted back to using drugs and drinking alcohol.  She admits to past SA.  She says she had several through out the years but the last one was 2 years ago by was of overdose.  She denies suicidal and homicidal ideation at this time.  She just really wants to stop drinking "drinking has ruined my life, I had everything before, a two bedroom apartment, a good job, everything".    Patient is followed by an act team and says they are helpful for the most part.  Patient has a court date in two weeks due to an altercation with a woman that resides at the boarding house.     Past Psychiatric History: Patient has a dx of Bipolar disorder  Risk to Self:   Risk to Others:   Prior Inpatient Therapy:   Prior Outpatient Therapy:    Past Medical History:  Past Medical History:  Diagnosis Date  . COPD (chronic obstructive pulmonary disease) ([place])   . Seizures ([place])    History reviewed. No pertinent surgical history. Family History: No family history on file. Family Psychiatric  History:  yes. Patient states that her sister has mental illness but unsure of which one. Social History:  Social History   Substance and Sexual Activity  Alcohol Use None     Social History   Substance and Sexual Activity  Drug Use Not on file    Social History   Socioeconomic History  . Marital status: Single    Spouse name: Not on file  . Number of children: Not on file  . Years of education: Not on file  . Highest education level: Not on file  Occupational History  . Not on file  Social Needs  . Financial resource strain: Not on file  . Food  insecurity    Worry: Not on file    Inability: Not on file  . Transportation needs    Medical: Not on file    Non-medical: Not on file  Tobacco Use  . Smoking status: Not on file  Substance and Sexual Activity  . Alcohol use: Not on file  . Drug use: Not on file  . Sexual activity: Not on file  Lifestyle  . Physical activity    Days per week: Not on file    Minutes per session: Not on file  . Stress: Not on file  Relationships  . Social Herbalist on phone: Not on file    Gets together: Not on file    Attends religious service: Not on file    Active member of club or organization: Not on file    Attends meetings of clubs or organizations: Not on file    Relationship status: Not on file  Other Topics Concern  . Not on file  Social History Narrative  . Not on file   Additional Social History:    Allergies:  No Known Allergies  Labs:  Results for orders placed or performed during the hospital encounter of 06/12/19 (from the past 48 hour(s))  Comprehensive metabolic panel     Status: Abnormal   Collection Time: 06/12/19  5:18 PM  Result Value Ref Range   Sodium 136 135 - 145 mmol/L   Potassium 2.6 (LL) 3.5 - 5.1 mmol/L    Comment: CRITICAL RESULT CALLED TO, READ BACK BY AND VERIFIED WITH AMY BOISVERT AT 1827 06/12/2019  TFK    Chloride 94 (L) 98 - 111 mmol/L   CO2 26 22 - 32 mmol/L   Glucose, Bld 89 70 - 99 mg/dL   BUN 9 6 - 20 mg/dL   Creatinine, Ser 1.01 (H) 0.44 - 1.00 mg/dL   Calcium 8.7 (L) 8.9 - 10.3 mg/dL   Total Protein 7.9 6.5 - 8.1 g/dL   Albumin 3.5 3.5 - 5.0 g/dL   AST 17 15 - 41 U/L   ALT 12 0 - 44 U/L   Alkaline Phosphatase 91 38 - 126 U/L   Total Bilirubin 0.5 0.3 - 1.2 mg/dL   GFR calc non Af Amer >60 >60 mL/min   GFR calc Af Amer >60 >60 mL/min   Anion gap 16 (H) 5 - 15    Comment: Performed at [facility], [address] [place], [place] [address]  Ethanol     Status: Abnormal   Collection Time: 06/12/19  5:18 PM   Result Value Ref Range   Alcohol, Ethyl (B) 147 (H) <10 mg/dL    Comment: (NOTE) Lowest detectable limit for serum alcohol is 10 mg/dL. For medical purposes only. Performed at [facility], [place], [place]  [address]   cbc     Status: Abnormal   Collection Time: 06/12/19  5:18 PM  Result Value Ref Range   WBC 10.2 4.0 - 10.5 K/uL   RBC 5.41 (H) 3.87 - 5.11 MIL/uL   Hemoglobin 16.7 (H) 12.0 - 15.0 g/dL   HCT 49.3 (H) 36.0 - 46.0 %   MCV 91.1 80.0 - 100.0 fL   MCH 30.9 26.0 - 34.0 pg   MCHC 33.9 30.0 - 36.0 g/dL   RDW 12.8 11.5 - 15.5 %   Platelets 589 (H) 150 - 400 K/uL   nRBC 0.0 0.0 - 0.2 %    Comment: Performed at [facility], [address], [place], [place] [address]  Urine Drug Screen, Qualitative     Status: Abnormal   Collection Time: 06/12/19  5:18 PM  Result Value Ref Range   Tricyclic, Ur Screen NONE DETECTED NONE DETECTED   Amphetamines, Ur Screen NONE DETECTED NONE DETECTED   MDMA (Ecstasy)Ur Screen NONE DETECTED NONE DETECTED   Cocaine Metabolite,Ur [place] POSITIVE (A) NONE DETECTED   Opiate, Ur Screen NONE DETECTED NONE DETECTED   Phencyclidine (PCP) Ur S NONE DETECTED NONE DETECTED   Cannabinoid 50 Ng, Ur [place] POSITIVE (A) NONE DETECTED   Barbiturates, Ur Screen NONE DETECTED NONE DETECTED   Benzodiazepine, Ur Scrn NONE DETECTED NONE DETECTED   Methadone Scn, Ur NONE DETECTED NONE DETECTED    Comment: (NOTE) Tricyclics + metabolites, urine    Cutoff 1000 ng/mL Amphetamines + metabolites, urine  Cutoff 1000 ng/mL MDMA (Ecstasy), urine              Cutoff 500 ng/mL Cocaine Metabolite, urine          Cutoff 300 ng/mL Opiate + metabolites, urine        Cutoff 300 ng/mL Phencyclidine (PCP), urine         Cutoff 25 ng/mL Cannabinoid, urine                 Cutoff 50 ng/mL Barbiturates + metabolites, urine  Cutoff 200 ng/mL Benzodiazepine, urine              Cutoff 200 ng/mL Methadone, urine                   Cutoff 300 ng/mL The  urine drug screen provides only a preliminary, unconfirmed analytical test result and should not be used for non-medical purposes. Clinical consideration and professional judgment should be applied to any positive drug screen result due to possible interfering substances. A more specific alternate chemical method must be used in order to obtain a confirmed analytical result. Gas chromatography / mass spectrometry (GC/MS) is the preferred confirmat ory method. Performed at [facility], [address] [place], [place] [address]   Pregnancy, urine     Status: None   Collection Time: 06/12/19  5:18 PM  Result Value Ref Range   Preg Test, Ur NEGATIVE NEGATIVE    Comment: Performed at [facility], [address] [place], [place] [address]  Basic metabolic panel     Status: Abnormal   Collection Time: 06/13/19  8:03 AM  Result Value Ref Range   Sodium 140 135 - 145 mmol/L   Potassium 3.3 (L) 3.5 - 5.1 mmol/L   Chloride 98 98 - 111 mmol/L   CO2 30 22 - 32 mmol/L   Glucose, Bld 91 70 - 99 mg/dL   BUN 10 6 - 20 mg/dL   Creatinine, Ser 1.06 (  H) 0.44 - 1.00 mg/dL   Calcium 8.3 (L) 8.9 - 10.3 mg/dL   GFR calc non Af Amer >60 >60 mL/min   GFR calc Af Amer >60 >60 mL/min   Anion gap 12 5 - 15    Comment: Performed at [facility], [address] [place], [place] [address]    Current Facility-Administered Medications  Medication Dose Route Frequency Provider Last Rate Last Dose  . 0.9 %  sodium chloride infusion   Intravenous PRN Merlyn Lot, MD   Stopped at 06/13/19 0104  . LORazepam (ATIVAN) injection 0-4 mg  0-4 mg Intravenous Q6H Merlyn Lot, MD   Stopped at 06/13/19 0305   Or  . LORazepam (ATIVAN) tablet 0-4 mg  0-4 mg Oral Q6H Merlyn Lot, MD   4 mg at 06/12/19 2143  . [START ON 06/15/2019] LORazepam (ATIVAN) injection 0-4 mg  0-4 mg Intravenous Q12H Merlyn Lot, MD       Or  . Derrill Memo ON 06/15/2019] LORazepam (ATIVAN) tablet  0-4 mg  0-4 mg Oral Q12H Merlyn Lot, MD      . potassium chloride SA (KLOR-CON) CR tablet 40 mEq  40 mEq Oral BID Merlyn Lot, MD   40 mEq at 06/12/19 2143  . thiamine (VITAMIN B-1) tablet 100 mg  100 mg Oral Daily Merlyn Lot, MD       Or  . thiamine (B-1) injection 100 mg  100 mg Intravenous Daily Merlyn Lot, MD       Current Outpatient Medications  Medication Sig Dispense Refill  . albuterol (VENTOLIN HFA) 108 (90 Base) MCG/ACT inhaler Inhale 2 puffs into the lungs every 6 (six) hours as needed for wheezing.    . divalproex (DEPAKOTE ER) 250 MG 24 hr tablet Take 1,250 mg by mouth 2 (two) times daily.    . fluticasone furoate-vilanterol (BREO ELLIPTA) 100-25 MCG/INH AEPB Inhale 1 puff into the lungs daily.    Marland Kitchen gabapentin (NEURONTIN) 300 MG capsule Take 600 mg by mouth 2 (two) times daily.    . hydrOXYzine (VISTARIL) 25 MG capsule Take 25 mg by mouth 2 (two) times daily.    . naloxone (NARCAN) 4 MG/0.1ML LIQD nasal spray kit Place 1 spray into the nose once.    Marland Kitchen omeprazole (PRILOSEC) 20 MG capsule Take 20 mg by mouth 2 (two) times daily before a meal.     . QUEtiapine (SEROQUEL) 300 MG tablet Take 600 mg by mouth at bedtime.      Musculoskeletal: Strength & Muscle Tone: within normal limits Gait & Station: unknown, patient was laying down during the assessment. Patient leans: N/A  Psychiatric Specialty Exam: Physical Exam  Nursing note and vitals reviewed. Constitutional: She is oriented to person, place, and time. She appears well-developed.  HENT:  Head: Normocephalic.  Eyes: Pupils are equal, round, and reactive to light.  Neck: Normal range of motion.  Respiratory: Effort normal.  Musculoskeletal: Normal range of motion.  Neurological: She is alert and oriented to person, place, and time.  Skin: Skin is warm and dry.  Psychiatric: Thought content normal. Her mood appears anxious. Her speech is rapid and/or pressured. She is hyperactive. Cognition  and memory are normal. She expresses impulsivity and inappropriate judgment. She exhibits a depressed mood.    Review of Systems  Psychiatric/Behavioral: Positive for depression and substance abuse. Negative for hallucinations. The patient is nervous/anxious and has insomnia.   All other systems reviewed and are negative.   Blood pressure (!) 123/97, pulse 92, temperature 98.2  F (36.8 C), temperature source Oral, resp. rate (!) 22, height 5' 5.5" (1.664 m), weight 77.1 kg, SpO2 96 %.Body mass index is 27.86 kg/m.  General Appearance: Disheveled  Eye Contact:  Good  Speech:  Pressured  Volume:  Normal  Mood:  Anxious, Depressed and Hopeless  Affect:  Congruent  Thought Process:  Coherent and Descriptions of Associations: Intact  Orientation:  Full (Time, Place, and Person)  Thought Content:  Logical  Suicidal Thoughts:  No  Homicidal Thoughts:  No  Memory:  Immediate;   Good  Judgement:  Fair  Insight:  Fair  Psychomotor Activity:  Normal  Concentration:  Concentration: Fair  Recall:  Good  Fund of Knowledge:  Good  Language:  Good  Akathisia:  NA  Handed:  Right  AIMS (if indicated):     Assets:  Communication Skills Desire for Improvement  ADL's:  Intact  Cognition:  WNL  Sleep: Patient is not sleeping. Sleeps less than 3 hours a day.       Treatment Plan Summary: Daily contact with patient to assess and evaluate symptoms and progress in treatment, Medication management and Plan Patient needs to be reassesd in the morning, due to intoxication  Disposition: No evidence of imminent risk to self or others at present.  "  Dixie Dials, MD 06/13/2019 12:10 PM

## 2019-10-04 ENCOUNTER — Emergency Department
Admission: EM | Admit: 2019-10-04 | Discharge: 2019-10-04 | Disposition: A | Discharge status: Home / Self Care | Payer: Medicare Other | Attending: Emergency Medicine | Admitting: Emergency Medicine

## 2019-10-04 ENCOUNTER — Emergency Department: Payer: Medicare Other

## 2019-10-04 ENCOUNTER — Other Ambulatory Visit: Payer: Self-pay

## 2019-10-04 DIAGNOSIS — R531 Weakness: Secondary | ICD-10-CM | POA: Insufficient documentation

## 2019-10-04 DIAGNOSIS — J449 Chronic obstructive pulmonary disease, unspecified: Secondary | ICD-10-CM | POA: Insufficient documentation

## 2019-10-04 DIAGNOSIS — F419 Anxiety disorder, unspecified: Secondary | ICD-10-CM | POA: Insufficient documentation

## 2019-10-04 DIAGNOSIS — R4781 Slurred speech: Secondary | ICD-10-CM | POA: Diagnosis not present

## 2019-10-04 DIAGNOSIS — Z9114 Patient's other noncompliance with medication regimen: Secondary | ICD-10-CM | POA: Insufficient documentation

## 2019-10-04 DIAGNOSIS — R29810 Facial weakness: Secondary | ICD-10-CM | POA: Insufficient documentation

## 2019-10-04 DIAGNOSIS — R251 Tremor, unspecified: Secondary | ICD-10-CM | POA: Diagnosis not present

## 2019-10-04 DIAGNOSIS — K117 Disturbances of salivary secretion: Secondary | ICD-10-CM | POA: Diagnosis not present

## 2019-10-04 DIAGNOSIS — Z79899 Other long term (current) drug therapy: Secondary | ICD-10-CM | POA: Diagnosis not present

## 2019-10-04 LAB — URINE DRUG SCREEN, QUALITATIVE (ARMC ONLY)
Amphetamines, Ur Screen: NOT DETECTED
Barbiturates, Ur Screen: NOT DETECTED
Benzodiazepine, Ur Scrn: NOT DETECTED
Cannabinoid 50 Ng, Ur ~~LOC~~: NOT DETECTED
Cocaine Metabolite,Ur ~~LOC~~: POSITIVE — AB
MDMA (Ecstasy)Ur Screen: NOT DETECTED
Methadone Scn, Ur: NOT DETECTED
Opiate, Ur Screen: NOT DETECTED
Phencyclidine (PCP) Ur S: NOT DETECTED
Tricyclic, Ur Screen: NOT DETECTED

## 2019-10-04 LAB — URINALYSIS, ROUTINE W REFLEX MICROSCOPIC
Bacteria, UA: NONE SEEN
Bilirubin Urine: NEGATIVE
Glucose, UA: NEGATIVE mg/dL
Ketones, ur: 20 mg/dL — AB
Nitrite: NEGATIVE
Protein, ur: NEGATIVE mg/dL
Specific Gravity, Urine: 1.024 (ref 1.005–1.030)
pH: 5 (ref 5.0–8.0)

## 2019-10-04 LAB — ETHANOL: Alcohol, Ethyl (B): <10 mg/dL (ref <10)

## 2019-10-04 LAB — COMPREHENSIVE METABOLIC PANEL
ALT: 9 U/L (ref 0–44)
AST: 11 U/L — ABNORMAL LOW (ref 15–41)
Albumin: 2.9 g/dL — ABNORMAL LOW (ref 3.5–5.0)
Alkaline Phosphatase: 45 U/L (ref 38–126)
Anion gap: 5 (ref 5–15)
BUN: 14 mg/dL (ref 6–20)
CO2: 22 mmol/L (ref 22–32)
Calcium: 7.2 mg/dL — ABNORMAL LOW (ref 8.9–10.3)
Chloride: 114 mmol/L — ABNORMAL HIGH (ref 98–111)
Creatinine, Ser: 0.65 mg/dL (ref 0.44–1.00)
GFR calc Af Amer: >60 mL/min (ref >60)
GFR calc non Af Amer: >60 mL/min (ref >60)
Glucose, Bld: 67 mg/dL — ABNORMAL LOW (ref 70–99)
Potassium: 2.7 mmol/L — CL (ref 3.5–5.1)
Sodium: 141 mmol/L (ref 135–145)
Total Bilirubin: 0.7 mg/dL (ref 0.3–1.2)
Total Protein: 5.7 g/dL — ABNORMAL LOW (ref 6.5–8.1)

## 2019-10-04 LAB — CBC WITH DIFFERENTIAL/PLATELET
Abs Immature Granulocytes: 0.05 10*3/uL (ref 0.00–0.07)
Basophils Absolute: 0.1 10*3/uL (ref 0.0–0.1)
Basophils Relative: 1 %
Eosinophils Absolute: 0.1 10*3/uL (ref 0.0–0.5)
Eosinophils Relative: 1 %
HCT: 39.1 % (ref 36.0–46.0)
Hemoglobin: 13.6 g/dL (ref 12.0–15.0)
Immature Granulocytes: 1 %
Lymphocytes Relative: 33 %
Lymphs Abs: 3.5 10*3/uL (ref 0.7–4.0)
MCH: 32.9 pg (ref 26.0–34.0)
MCHC: 34.8 g/dL (ref 30.0–36.0)
MCV: 94.7 fL (ref 80.0–100.0)
Monocytes Absolute: 0.7 10*3/uL (ref 0.1–1.0)
Monocytes Relative: 6 %
Neutro Abs: 6.4 10*3/uL (ref 1.7–7.7)
Neutrophils Relative %: 58 %
Platelets: 469 10*3/uL — ABNORMAL HIGH (ref 150–400)
RBC: 4.13 MIL/uL (ref 3.87–5.11)
RDW: 12.8 % (ref 11.5–15.5)
WBC: 10.8 10*3/uL — ABNORMAL HIGH (ref 4.0–10.5)
nRBC: 0 % (ref 0.0–0.2)

## 2019-10-04 LAB — VALPROIC ACID LEVEL: Valproic Acid Lvl: <10 ug/mL — ABNORMAL LOW (ref 50.0–100.0)

## 2019-10-04 LAB — POCT PREGNANCY, URINE: Preg Test, Ur: NEGATIVE

## 2019-10-04 MED ORDER — POTASSIUM CHLORIDE CRYS ER 20 MEQ PO TBCR
40.0000 meq | EXTENDED_RELEASE_TABLET | Freq: Once | ORAL | Status: AC
  Administered 2019-10-04: 17:00:00 40 meq via ORAL
  Filled 2019-10-04: qty 2

## 2019-10-04 MED ORDER — SODIUM CHLORIDE 0.9% FLUSH
10.0000 mL | Freq: Two times a day (BID) | INTRAVENOUS | Status: DC
  Administered 2019-10-04: 16:00:00 10 mL

## 2019-10-04 MED ORDER — SODIUM CHLORIDE 0.9% FLUSH: 10.0000 mL | INTRAVENOUS | Status: DC | PRN

## 2019-10-04 MED ORDER — CALCIUM CARBONATE ANTACID 500 MG PO CHEW: 1.0000 | CHEWABLE_TABLET | Freq: Two times a day (BID) | ORAL | 0 refills | Status: AC

## 2019-10-04 MED ORDER — POTASSIUM CHLORIDE CRYS ER 20 MEQ PO TBCR: 20.0000 meq | EXTENDED_RELEASE_TABLET | Freq: Two times a day (BID) | ORAL | 0 refills | Status: DC

## 2019-10-04 MED ORDER — QUETIAPINE FUMARATE 300 MG PO TABS: 300.0000 mg | ORAL_TABLET | Freq: Every day | ORAL | 0 refills | Status: DC

## 2019-10-04 NOTE — ED Triage Notes (Signed)
Pt arrives via ems from home. Hx of stroke with left sided deficits. Pt reports increase in drooling, talk and speech are pt baseline per friend. Pt a&o x 4, NAD noted at this time. Pt denies any trouble swallowing and reports eating normal diet. No drooling noted at this time  vitals WNL 139/91 HR 85 T 97.4  98% room air  CBG 117

## 2019-10-04 NOTE — ED Notes (Signed)
Pt departed for CT scan at this time.

## 2019-10-04 NOTE — ED Notes (Addendum)
Currently waiting for IV to be placed to obtain labs. IV team currently at bedside

## 2019-10-04 NOTE — ED Provider Notes (Signed)
-----------------------------------------   4:58 PM on 10/04/2019 -----------------------------------------  Patient is requesting discharge home at this time.  Patient's work-up has been largely nonrevealing.  Patient potassium is low, states it is always low.  Has been low in the past but given her deficit and tremor we will replete with oral potassium twice daily for the next 14 days.  Patient states she is confirm that her prescriptions for Depakote and gabapentin are currently at her mother's house and she will start those tonight.  Patient states she is out of her Seroquel which she has been using for anxiety, taken each night.  We will refill a 30-day prescription of the patient's home Seroquel.  Patient will follow up with her neurologist.  Patient is currently eating, no distress.   Minna Antis, MD 10/04/19 1701

## 2019-10-04 NOTE — ED Notes (Signed)
Pt cleared by MD to have something to eat. Provided the patient with a sandwich tray, crackers, and water. Pt eating at this moment.

## 2019-10-04 NOTE — ED Notes (Signed)
This RN attempted to obtain IV access to complete labs, unable to obtain access at this time. Pt reports feeling anxious and shifting frequently in bed

## 2019-10-04 NOTE — ED Provider Notes (Signed)
[facility] Emergency Department Provider Note  ____________________________________________   First MD Initiated Contact with Patient 10/04/19 1339     (approximate)  I have reviewed the triage vital signs and the nursing notes.   HISTORY  Chief Complaint Tremor  HPI Molly Dennis is a 49 y.o. female with COPD and seizures who comes in right arm tremor.  Patient states for the past few weeks she has had some tremoring sensation in her right arm.  Is been constant, worse with movement, better at rest.  Denies having this previously.  She states that her last seizure was over a year ago.  She has not been compliant with her medications.  She has been off of her Depakote for about 1 week..  Patient states that she also has had worsening saliva come out of her mouth.  She had a prior stroke with baseline left-sided weakness and some facial droop.  She denies her speech being any different than prior.  She states that all of her symptoms been going on for at least 3-4 weeks and the only reason that she is here is because her mother made her come          Past Medical History:  Diagnosis Date  . COPD (chronic obstructive pulmonary disease) ([place])   . Seizures [facility])     Patient Active Problem List   Diagnosis Date Noted  . Alcohol abuse 06/12/2019    No past surgical history on file.  Prior to Admission medications   Medication Sig Start Date End Date Taking? Authorizing Provider  albuterol (VENTOLIN HFA) 108 (90 Base) MCG/ACT inhaler Inhale 2 puffs into the lungs every 6 (six) hours as needed for wheezing. 02/09/19   [provider]  divalproex (DEPAKOTE ER) 250 MG 24 hr tablet Take 1,250 mg by mouth 2 (two) times daily. 04/21/19   [provider]  fluticasone furoate-vilanterol (BREO ELLIPTA) 100-25 MCG/INH AEPB Inhale 1 puff into the lungs daily. 02/09/19   [provider]  gabapentin (NEURONTIN) 300 MG capsule Take 600 mg by mouth 2  (two) times daily. 02/09/19 02/09/20  [provider]  hydrOXYzine (VISTARIL) 25 MG capsule Take 25 mg by mouth 2 (two) times daily. 06/06/19   [provider]  naloxone (NARCAN) 4 MG/0.1ML LIQD nasal spray kit Place 1 spray into the nose once. 11/16/17 11/16/19  [provider]  omeprazole (PRILOSEC) 20 MG capsule Take 20 mg by mouth 2 (two) times daily before a meal.  05/22/19   [provider]  QUEtiapine (SEROQUEL) 300 MG tablet Take 600 mg by mouth at bedtime. 02/21/19   [provider]    Allergies Patient has no known allergies.  No family history on file.  Social History Social History   Tobacco Use  . Smoking status: Not on file  Substance Use Topics  . Alcohol use: Not on file  . Drug use: Not on file      Review of Systems Constitutional: No fever/chills Eyes: No visual changes. ENT: No sore throat. Cardiovascular: Denies chest pain. Respiratory: Denies shortness of breath. Gastrointestinal: No abdominal pain.  No nausea, no vomiting.  No diarrhea.  No constipation. Genitourinary: Negative for dysuria. Musculoskeletal: Negative for back pain. Skin: Negative for rash. Neurological: Positive right arm tremor and concern for increased drooling All other ROS negative ____________________________________________   PHYSICAL EXAM:  VITAL SIGNS: ED Triage Vitals  Enc Vitals Group     BP 10/04/19 1332 (!) 149/96  Pulse Rate 10/04/19 1332 88     Resp 10/04/19 1332 16     Temp 10/04/19 1332 98.6 F (37 C)     Temp Source 10/04/19 1332 Oral     SpO2 --      Weight --      Height --      Head Circumference --      Peak Flow --      Pain Score 10/04/19 1333 10     Pain Loc --      Pain Edu? --      Excl. in [place]? --     Constitutional: Alert and oriented. Well appearing and in no acute distress. Eyes: Conjunctivae are normal. EOMI. Head: Atraumatic. Nose: No congestion/rhinnorhea. Mouth/Throat: Mucous membranes are  moist.   Neck: No stridor. Trachea Midline. FROM Cardiovascular: Normal rate, regular rhythm. Grossly normal heart sounds.  Good peripheral circulation. Respiratory: Normal respiratory effort.  No retractions. Lungs CTAB. Gastrointestinal: Soft and nontender. No distention. No abdominal bruits.  Musculoskeletal: No lower extremity tenderness nor edema.  No joint effusions. Neurologic: Facial droop on the left and weakness on the left arm and leg.  According to patient this is baseline for her.  Somewhat slurred speech but patient states that this is also baseline.  Patient while resting does not seem to have any tremor noted but will move her arm on the right side will have a slight fine tremor.  No obvious drooling.  Patient handling secretions at this time. Skin:  Skin is warm, dry and intact. No rash noted. Psychiatric: Mood and affect are normal. Speech and behavior are normal. GU: Deferred   ____________________________________________   LABS (all labs ordered are listed, but only abnormal results are displayed)  Labs Reviewed  CBC WITH DIFFERENTIAL/PLATELET - Abnormal; Notable for the following components:      Result Value   WBC 10.8 (*)    Platelets 469 (*)    All other components within normal limits  COMPREHENSIVE METABOLIC PANEL - Abnormal; Notable for the following components:   Potassium 2.7 (*)    Chloride 114 (*)    Glucose, Bld 67 (*)    Calcium 7.2 (*)    Total Protein 5.7 (*)    Albumin 2.9 (*)    AST 11 (*)    All other components within normal limits  URINALYSIS, ROUTINE W REFLEX MICROSCOPIC - Abnormal; Notable for the following components:   Color, Urine YELLOW (*)    APPearance CLEAR (*)    Hgb urine dipstick MODERATE (*)    Ketones, ur 20 (*)    Leukocytes,Ua TRACE (*)    All other components within normal limits  URINE DRUG SCREEN, QUALITATIVE ([facility] ONLY) - Abnormal; Notable for the following components:   Cocaine Metabolite,Ur [place] POSITIVE (*)    All  other components within normal limits  VALPROIC ACID LEVEL - Abnormal; Notable for the following components:   Valproic Acid Lvl <10 (*)    All other components within normal limits  ETHANOL  POC URINE PREG, ED  POCT PREGNANCY, URINE   ____________________________________________   ED ECG REPORT I, Vanessa Swisher, the attending physician, personally viewed and interpreted this ECG.  EKG is normal sinus rhythm 92, no ST elevation, no T wave inversions, left anterior fascicular block ____________________________________________  RADIOLOGY   Official radiology report(s): CT Head Wo Contrast  Result Date: 10/04/2019 CLINICAL DATA:  Headache EXAM: CT HEAD WITHOUT CONTRAST TECHNIQUE: Contiguous axial images were obtained from the base  of the skull through the vertex without intravenous contrast. COMPARISON:  06/12/2019 FINDINGS: Brain: No evidence of acute infarction, hemorrhage, hydrocephalus, extra-axial collection or mass lesion/mass effect. Redemonstrated encephalomalacia of the right insula and adjacent anterior right frontal lobe. Vascular: No hyperdense vessel or unexpected calcification. Skull: Normal. Negative for fracture or focal lesion. Sinuses/Orbits: No acute finding. Other: None. IMPRESSION: 1. No acute intracranial pathology. 2. Redemonstrated encephalomalacia of the right insula and adjacent anterior right frontal lobe, in keeping with prior infarction. Electronically Signed   By: Eddie Candle M.D.   On: 10/04/2019 14:42    ____________________________________________   PROCEDURES  Procedure(s) performed (including Critical Care):  Procedures   ____________________________________________   INITIAL IMPRESSION / ASSESSMENT AND PLAN / ED COURSE  Molly Dennis was evaluated in Emergency Department on 10/04/2019 for the symptoms described in the history of present illness. She was evaluated in the context of the global COVID-19 pandemic, which necessitated consideration that  the patient might be at risk for infection with the SARS-CoV-2 virus that causes COVID-19. Institutional protocols and algorithms that pertain to the evaluation of patients at risk for COVID-19 are in a state of rapid change based on information released by regulatory bodies including the CDC and federal and state organizations. These policies and algorithms were followed during the patient's care in the ED.     Patient is a 49 year old with significant history of a stroke with left-sided deficits, slurred speech and facial droop who comes in with a slight right arm tremor as well as increased drooling.  Patient states that she would not of been here because her mom had told her to come.  She states from a neuro perspective she feels at her baseline other than the slight tremor that seems to only come on when she tries to move her arm.  However patient is been noncompliant with her Depakote and gabapentin for her over a week which could be contributing to her symptoms.  Will get repeat CT head to evaluate for worsening progression of stroke, intracranial hemorrhage, mass.  Will get labs to evaluate for electrolyte abnormalities, AKI but could also be contributing to shaking.  Will get LFTs evaluate for liver dysfunction that could be causing this.  CT head was stable from prior with known prior old stroke.  Given patient states that from a neuro perspective other than the slight arm tremor on the right arm she is at her baseline do not think MRI will be necessary at this time.  Patient handed off to oncoming team pending her labs and reevaluation.      ____________________________________________   FINAL CLINICAL IMPRESSION(S) / ED DIAGNOSES   Final diagnoses:  Tremor      MEDICATIONS GIVEN DURING THIS VISIT:  Medications  potassium chloride SA (KLOR-CON) CR tablet 40 mEq (40 mEq Oral Given 10/04/19 1633)     ED Discharge Orders         Ordered    potassium chloride SA (KLOR-CON) 20 MEQ  tablet  2 times daily     10/04/19 1704    calcium carbonate (TUMS) 500 MG chewable tablet  2 times daily     10/04/19 1704    QUEtiapine (SEROQUEL) 300 MG tablet  Daily at bedtime     10/04/19 1704           Note:  This document was prepared using Dragon voice recognition software and may include unintentional dictation errors.   Vanessa [place], MD 10/05/19 [phone]

## 2019-10-06 ENCOUNTER — Encounter: Payer: Self-pay | Admitting: Emergency Medicine

## 2019-10-06 ENCOUNTER — Other Ambulatory Visit: Payer: Self-pay

## 2019-10-06 ENCOUNTER — Emergency Department
Admission: EM | Admit: 2019-10-06 | Discharge: 2019-10-06 | Disposition: A | Discharge status: Home / Self Care | Payer: Medicare Other | Attending: Student in an Organized Health Care Education/Training Program | Admitting: Student in an Organized Health Care Education/Training Program

## 2019-10-06 DIAGNOSIS — F1010 Alcohol abuse, uncomplicated: Secondary | ICD-10-CM | POA: Diagnosis not present

## 2019-10-06 DIAGNOSIS — Z79899 Other long term (current) drug therapy: Secondary | ICD-10-CM | POA: Diagnosis not present

## 2019-10-06 DIAGNOSIS — F17210 Nicotine dependence, cigarettes, uncomplicated: Secondary | ICD-10-CM | POA: Insufficient documentation

## 2019-10-06 DIAGNOSIS — J449 Chronic obstructive pulmonary disease, unspecified: Secondary | ICD-10-CM | POA: Insufficient documentation

## 2019-10-06 DIAGNOSIS — R531 Weakness: Secondary | ICD-10-CM | POA: Diagnosis present

## 2019-10-06 MED ORDER — SODIUM CHLORIDE 0.9 % IV BOLUS: 500.0000 mL | Freq: Once | INTRAVENOUS | Status: DC

## 2019-10-06 MED ORDER — SODIUM CHLORIDE 0.9% FLUSH: 3.0000 mL | Freq: Once | INTRAVENOUS | Status: DC

## 2019-10-06 MED ORDER — THIAMINE HCL 100 MG PO TABS: 100.0000 mg | ORAL_TABLET | Freq: Once | ORAL | Status: DC

## 2019-10-06 NOTE — ED Triage Notes (Signed)
PT WANTING TO LEAVE. STILL WALKING AROUND LOBBY.  DISCUSSED WITH DR Cyril Loosen, WILL PUT PT IN HALLWAY BED TO BE SEEN BY PROVIDER

## 2019-10-06 NOTE — ED Triage Notes (Signed)
pt repeatedly getting up walking in lobby. Slightly unsteady of feet.  Pt has 3 blankets.  Charge RN aware of pt.

## 2019-10-06 NOTE — ED Notes (Signed)
Pt requesting phone. Pt given phone. Pt stating she was leaving and had called a taxi. Helmut Muster, RN at bedside giving pt graham crackers and water. MD aware.

## 2019-10-06 NOTE — ED Triage Notes (Signed)
FIRST NURSE NOTE- seen couple days ago and dx with tremors.  Has not taken meds given per EMS.  Pt is a drinker, last drink this morning.  CBG 102.  HR 118 with EMS.

## 2019-10-06 NOTE — ED Notes (Signed)
See triage note. When asked why she is here, pt states, "I don't know." Asked pt what we can help her with today, pt states, "everything." Pt repeatedly asking staff to adjust her blankets for her. [place] encouraged w/r/t ADLs to best of pt's ability.

## 2019-10-06 NOTE — ED Notes (Addendum)
Pt stating she wants to leave and has already called herself a cab. MD Derrill Kay notified, per EDP no indication to place pt under IVC at this time and she may leave. Pt given graham crackers and water, pt able to walk from lobby outside to driveway. Pt directed to wheelchair in lobby to wait for cab to arrive, blanket given per request. First nurse Vikki Ports aware. Pt refused VS recheck or e-signature prior to departure.

## 2019-10-06 NOTE — ED Triage Notes (Addendum)
Pt presents to ED via [facility]. When asked why patient was here pt states "same shit different day". Pt arrives with noted slurred speech, pt endorses ETOH use PTA, when asked how much pt states "about that much". Pt states "I'm not in a good mood today".  Pt states "I cannot get no blood drawn, it has to be a midline, no matter you do you're not sticking me, it has to be a midline". Pt is alert, oriented to time, person, disoriented to place, and unable to assess orientation to situation.   Pt noted to be lethargic in triage, falling asleep. When awake pt noted to be demanding her legs to be up. This RN repeatedly explained to patient needing to finish triage process. Pt states "I'm drooling", pt given tissues, noted to drool when falling asleep in triage. UTA symptoms, onset of symptoms or anything further due to patient refusing to answer questions or giving the answer "same shit different day" when asked. Pt also states "I'm not trying to be shitty with y'all". This RN spoke with patient regarding appropriate behavior with staff.

## 2019-10-06 NOTE — ED Provider Notes (Signed)
[facility] Emergency Department Provider Note    First MD Initiated Contact with Patient 10/06/19 1402     (approximate)  I have reviewed the triage vital signs and the nursing notes.   HISTORY  Chief Complaint Weakness and Altered Mental Status    HPI Molly Dennis is a 49 y.o. female close past medical history presents to ER stating that she feels "weak all over and sick ".  Does admit to drinking "several "drinks of alcohol.  States her last drink of alcohol was earlier this morning.  Does appear clinically intoxicated.  Denies any focal pain or weakness.  Does not feel like she is suicidal.  Denies any SI or HI.    Past Medical History:  Diagnosis Date  . COPD (chronic obstructive pulmonary disease) ([place])   . Seizures ([place])    No family history on file. History reviewed. No pertinent surgical history. Patient Active Problem List   Diagnosis Date Noted  . Alcohol abuse 06/12/2019      Prior to Admission medications   Medication Sig Start Date End Date Taking? Authorizing Provider  albuterol (VENTOLIN HFA) 108 (90 Base) MCG/ACT inhaler Inhale 2 puffs into the lungs every 6 (six) hours as needed for wheezing. 02/09/19   [provider]  calcium carbonate (TUMS) 500 MG chewable tablet Chew 1 tablet (200 mg of elemental calcium total) by mouth 2 (two) times daily. 10/04/19 10/03/20  Harvest Dark, MD  divalproex (DEPAKOTE ER) 250 MG 24 hr tablet Take 1,250 mg by mouth 2 (two) times daily. 04/21/19   [provider]  fluticasone furoate-vilanterol (BREO ELLIPTA) 100-25 MCG/INH AEPB Inhale 1 puff into the lungs daily. 02/09/19   [provider]  gabapentin (NEURONTIN) 300 MG capsule Take 600 mg by mouth 2 (two) times daily. 02/09/19 02/09/20  [provider]  hydrOXYzine (VISTARIL) 25 MG capsule Take 25 mg by mouth 2 (two) times daily. 06/06/19   [provider]  naloxone (NARCAN) 4 MG/0.1ML LIQD nasal spray kit Place 1  spray into the nose once. 11/16/17 11/16/19  [provider]  omeprazole (PRILOSEC) 20 MG capsule Take 20 mg by mouth 2 (two) times daily before a meal.  05/22/19   [provider]  potassium chloride SA (KLOR-CON) 20 MEQ tablet Take 1 tablet (20 mEq total) by mouth 2 (two) times daily. 10/04/19   Harvest Dark, MD  QUEtiapine (SEROQUEL) 300 MG tablet Take 1 tablet (300 mg total) by mouth at bedtime. 10/04/19   Harvest Dark, MD    Allergies Patient has no known allergies.    Social History Social History   Tobacco Use  . Smoking status: Current Every Day Smoker  . Smokeless tobacco: Never Used  Substance Use Topics  . Alcohol use: Yes  . Drug use: Not on file    Review of Systems Patient denies headaches, rhinorrhea, blurry vision, numbness, shortness of breath, chest pain, edema, cough, abdominal pain, nausea, vomiting, diarrhea, dysuria, fevers, rashes or hallucinations unless otherwise stated above in HPI. ____________________________________________   PHYSICAL EXAM:  VITAL SIGNS: Vitals:   10/06/19 1306  BP: 111/85  Pulse: (!) 117  Resp: 20  Temp: 98.1 F (36.7 C)  SpO2: 94%    Constitutional: Alert, intoxicated and disheveled Eyes: Conjunctivae are normal.  Head: Atraumatic. Nose: No congestion/rhinnorhea. Mouth/Throat: Mucous membranes are moist.   Neck: No stridor. Painless ROM.  Cardiovascular: mildly tachycardic, regular rhythm. Grossly normal heart sounds.  Good peripheral circulation. Respiratory: Normal respiratory effort.  No retractions. Lungs CTAB. Gastrointestinal: Soft and nontender. No distention. No abdominal bruits. No CVA tenderness. Genitourinary:  Musculoskeletal: No lower extremity tenderness nor edema.  No joint effusions. Neurologic:  No gross focal neurologic deficits are appreciated. No facial droop Skin:  Skin is warm, dry and intact. No rash noted. Psychiatric: calm and cooperative,  intoxicated  ____________________________________________   LABS (all labs ordered are listed, but only abnormal results are displayed)  No results found for this or any previous visit (from the past 24 hour(s)). ____________________________________________  EKG My review and personal interpretation at Time: 13:04   Indication: intoxication  Rate: 120  Rhythm: sinus Axis: left Other: normal intervals, no stemi ____________________________________________  RADIOLOGY   ____________________________________________   PROCEDURES  Procedure(s) performed:  Procedures    Critical Care performed: no ____________________________________________   INITIAL IMPRESSION / ASSESSMENT AND PLAN / ED COURSE  Pertinent labs & imaging results that were available during my care of the patient were reviewed by me and considered in my medical decision making (see chart for details).   DDX: intoxication, substance abuse, withdrawal  Molly Dennis is a 49 y.o. who presents to the ED with symptoms as described above.  Patient admits to drinking alcohol.  Does arrive appearing acutely intoxicated.  She denied suicidal.  Refusing to have anyone with ultrasound team obtain blood work or give IV fluids.  Place consult.  Presentation does not seem consistent with stroke.  Suspect this is more metabolic in the setting of substance abuse.  Will give IV fluids.  Patient will be signed out to oncoming physician pending reassessment.     The patient was evaluated in Emergency Department today for the symptoms described in the history of present illness. He/she was evaluated in the context of the global COVID-19 pandemic, which necessitated consideration that the patient might be at risk for infection with the SARS-CoV-2 virus that causes COVID-19. Institutional protocols and algorithms that pertain to the evaluation of patients at risk for COVID-19 are in a state of rapid change based on information released by  regulatory bodies including the CDC and federal and state organizations. These policies and algorithms were followed during the patient's care in the ED.  As part of my medical decision making, I reviewed the following data within the [facility] notes reviewed and incorporated, Labs reviewed, notes from prior ED visits and [place] Controlled Substance Database   ____________________________________________   FINAL CLINICAL IMPRESSION(S) / ED DIAGNOSES  Final diagnoses:  Alcohol abuse      NEW MEDICATIONS STARTED DURING THIS VISIT:  New Prescriptions   No medications on file     Note:  This document was prepared using Dragon voice recognition software and may include unintentional dictation errors.    Merlyn Lot, MD 10/06/19 1520

## 2019-11-27 ENCOUNTER — Other Ambulatory Visit: Payer: Self-pay

## 2019-11-27 ENCOUNTER — Emergency Department: Payer: Medicare Other

## 2019-11-27 ENCOUNTER — Emergency Department
Admission: EM | Admit: 2019-11-27 | Discharge: 2019-11-27 | Disposition: A | Discharge status: Home / Self Care | Payer: Medicare Other | Attending: Emergency Medicine | Admitting: Emergency Medicine

## 2019-11-27 ENCOUNTER — Encounter: Payer: Self-pay | Admitting: Emergency Medicine

## 2019-11-27 DIAGNOSIS — Z79899 Other long term (current) drug therapy: Secondary | ICD-10-CM | POA: Insufficient documentation

## 2019-11-27 DIAGNOSIS — J449 Chronic obstructive pulmonary disease, unspecified: Secondary | ICD-10-CM | POA: Insufficient documentation

## 2019-11-27 DIAGNOSIS — F1210 Cannabis abuse, uncomplicated: Secondary | ICD-10-CM | POA: Diagnosis not present

## 2019-11-27 DIAGNOSIS — R404 Transient alteration of awareness: Secondary | ICD-10-CM | POA: Diagnosis not present

## 2019-11-27 DIAGNOSIS — F1410 Cocaine abuse, uncomplicated: Secondary | ICD-10-CM | POA: Insufficient documentation

## 2019-11-27 DIAGNOSIS — F17210 Nicotine dependence, cigarettes, uncomplicated: Secondary | ICD-10-CM | POA: Diagnosis not present

## 2019-11-27 DIAGNOSIS — R4182 Altered mental status, unspecified: Secondary | ICD-10-CM | POA: Diagnosis present

## 2019-11-27 LAB — MAGNESIUM: Magnesium: 1.8 mg/dL (ref 1.7–2.4)

## 2019-11-27 LAB — COMPREHENSIVE METABOLIC PANEL
ALT: 11 U/L (ref 0–44)
AST: 20 U/L (ref 15–41)
Albumin: 3.6 g/dL (ref 3.5–5.0)
Alkaline Phosphatase: 58 U/L (ref 38–126)
Anion gap: 8 (ref 5–15)
BUN: 19 mg/dL (ref 6–20)
CO2: 25 mmol/L (ref 22–32)
Calcium: 9.2 mg/dL (ref 8.9–10.3)
Chloride: 105 mmol/L (ref 98–111)
Creatinine, Ser: 1.11 mg/dL — ABNORMAL HIGH (ref 0.44–1.00)
GFR calc Af Amer: >60 mL/min (ref >60)
GFR calc non Af Amer: 58 mL/min — ABNORMAL LOW (ref >60)
Glucose, Bld: 132 mg/dL — ABNORMAL HIGH (ref 70–99)
Potassium: 3.9 mmol/L (ref 3.5–5.1)
Sodium: 138 mmol/L (ref 135–145)
Total Bilirubin: 0.5 mg/dL (ref 0.3–1.2)
Total Protein: 7.5 g/dL (ref 6.5–8.1)

## 2019-11-27 LAB — CBC WITH DIFFERENTIAL/PLATELET
Abs Immature Granulocytes: 0.06 10*3/uL (ref 0.00–0.07)
Basophils Absolute: 0.1 10*3/uL (ref 0.0–0.1)
Basophils Relative: 1 %
Eosinophils Absolute: 0.2 10*3/uL (ref 0.0–0.5)
Eosinophils Relative: 3 %
HCT: 38.9 % (ref 36.0–46.0)
Hemoglobin: 13.4 g/dL (ref 12.0–15.0)
Immature Granulocytes: 1 %
Lymphocytes Relative: 44 %
Lymphs Abs: 3.6 10*3/uL (ref 0.7–4.0)
MCH: 32.4 pg (ref 26.0–34.0)
MCHC: 34.4 g/dL (ref 30.0–36.0)
MCV: 94 fL (ref 80.0–100.0)
Monocytes Absolute: 0.4 10*3/uL (ref 0.1–1.0)
Monocytes Relative: 5 %
Neutro Abs: 3.9 10*3/uL (ref 1.7–7.7)
Neutrophils Relative %: 46 %
Platelets: 289 10*3/uL (ref 150–400)
RBC: 4.14 MIL/uL (ref 3.87–5.11)
RDW: 11.8 % (ref 11.5–15.5)
WBC: 8.3 10*3/uL (ref 4.0–10.5)
nRBC: 0 % (ref 0.0–0.2)

## 2019-11-27 LAB — URINALYSIS, ROUTINE W REFLEX MICROSCOPIC
Bilirubin Urine: NEGATIVE
Glucose, UA: NEGATIVE mg/dL
Hgb urine dipstick: NEGATIVE
Ketones, ur: NEGATIVE mg/dL
Nitrite: NEGATIVE
Protein, ur: NEGATIVE mg/dL
Specific Gravity, Urine: 1.02 (ref 1.005–1.030)
pH: 7 (ref 5.0–8.0)

## 2019-11-27 LAB — URINE DRUG SCREEN, QUALITATIVE (ARMC ONLY)
Amphetamines, Ur Screen: NOT DETECTED
Barbiturates, Ur Screen: NOT DETECTED
Benzodiazepine, Ur Scrn: NOT DETECTED
Cannabinoid 50 Ng, Ur ~~LOC~~: NOT DETECTED
Cocaine Metabolite,Ur ~~LOC~~: POSITIVE — AB
MDMA (Ecstasy)Ur Screen: NOT DETECTED
Methadone Scn, Ur: NOT DETECTED
Opiate, Ur Screen: NOT DETECTED
Phencyclidine (PCP) Ur S: NOT DETECTED
Tricyclic, Ur Screen: POSITIVE — AB

## 2019-11-27 LAB — URINALYSIS, MICROSCOPIC (REFLEX): Bacteria, UA: NONE SEEN

## 2019-11-27 LAB — LACTIC ACID, PLASMA: Lactic Acid, Venous: 1.5 mmol/L (ref 0.5–1.9)

## 2019-11-27 LAB — ETHANOL: Alcohol, Ethyl (B): <10 mg/dL (ref <10)

## 2019-11-27 MED ORDER — LEVETIRACETAM IN NACL 1000 MG/100ML IV SOLN
1000.0000 mg | INTRAVENOUS | Status: AC
  Administered 2019-11-27: 07:00:00 1000 mg via INTRAVENOUS
  Filled 2019-11-27: qty 100

## 2019-11-27 MED ORDER — LEVETIRACETAM 500 MG PO TABS: 500.0000 mg | ORAL_TABLET | Freq: Two times a day (BID) | ORAL | 1 refills | Status: DC

## 2019-11-27 NOTE — ED Notes (Signed)
Attempt iv insertion x1 without difficulty.

## 2019-11-27 NOTE — ED Notes (Signed)
E-signature not working at this time. Pt verbalized understanding of D/C instructions, prescriptions and follow up care with no further questions at this time. Pt in NAD and wheeled to lobby at time of D/C. Pt mother called and will be coming to pick pt up. Pt advocate in lobby aware of plan.

## 2019-11-27 NOTE — ED Triage Notes (Signed)
Patient brought in by ems from a boarding house. Patient's friend reported finding her in the floor unresponsive. For ems patient was alert but confused and uncooperative. Patient alert and confused at this time. Patient with some redness to left cheek.

## 2019-11-27 NOTE — ED Notes (Signed)
Patient returned from CT

## 2019-11-27 NOTE — Discharge Instructions (Addendum)
As we discussed, your work-up was generally reassuring today.  It is possible that you had a seizure since you have stopped taking your seizure medication over the last week, but your lab work is generally reassuring and your head CT was normal.  As we discussed, I started you on a new seizure medicine called Keppra.  Please follow-up with your doctor at the next available opportunity, avoid drugs and alcohol, and if you need help with substance abuse, please contact one of the resources listed in this paperwork.  Insert return statement.

## 2019-11-27 NOTE — ED Provider Notes (Signed)
[facility] Emergency Department Provider Note  ____________________________________________   First MD Initiated Contact with Patient 11/27/19 [phone]     (approximate)  I have reviewed the triage vital signs and the nursing notes.   HISTORY  Chief Complaint Altered Mental Status    HPI Molly Dennis is a 49 y.o. female with a history of alcohol abuse, substance abuse in general including cocaine, and seizures for which she used to take Depakote but reports that she stopped taking it within the last week because she is afraid it will make her hair fall out.  She says she has not had any alcohol for weeks.  She presents by EMS for altered mental status.  Reportedly she was in a boardinghouse and a friend found her unresponsive in the floor.  EMS reports that she was awake and alert but confused and initially uncooperative.  Upon arrival to the ED she was confused but by the time I saw her she seems to be back to her baseline except for not knowing exactly what happened.  She said that nothing hurts.  She has no headache, visual changes, neck pain, chest pain, shortness of breath, nausea, vomiting, or abdominal pain.  She told me information listed above about stopping her Depakote and why.  The onset of the episode was reportedly acute and severe and nothing in particular made the symptoms better or worse but gradually improved over time.  She has had no recent fever, sore throat, cough, or shortness of breath.         Past Medical History:  Diagnosis Date  . COPD (chronic obstructive pulmonary disease) (HCC)   . Seizures [facility])     Patient Active Problem List   Diagnosis Date Noted  . Alcohol abuse 06/12/2019    Past Surgical History:  Procedure Laterality Date  . SHOULDER SURGERY      Prior to Admission medications   Medication Sig Start Date End Date Taking? Authorizing Provider  albuterol (VENTOLIN HFA) 108 (90 Base) MCG/ACT inhaler Inhale 2 puffs into  the lungs every 6 (six) hours as needed for wheezing. 02/09/19   [provider]  calcium carbonate (TUMS) 500 MG chewable tablet Chew 1 tablet (200 mg of elemental calcium total) by mouth 2 (two) times daily. 10/04/19 10/03/20  Minna Antis, MD  divalproex (DEPAKOTE ER) 250 MG 24 hr tablet Take 1,250 mg by mouth 2 (two) times daily. 04/21/19   [provider]  fluticasone furoate-vilanterol (BREO ELLIPTA) 100-25 MCG/INH AEPB Inhale 1 puff into the lungs daily. 02/09/19   [provider]  gabapentin (NEURONTIN) 300 MG capsule Take 600 mg by mouth 2 (two) times daily. 02/09/19 02/09/20  [provider]  hydrOXYzine (VISTARIL) 25 MG capsule Take 25 mg by mouth 2 (two) times daily. 06/06/19   [provider]  levETIRAcetam (KEPPRA) 500 MG tablet Take 1 tablet (500 mg total) by mouth 2 (two) times daily. 11/27/19   [name], MD  omeprazole (PRILOSEC) 20 MG capsule Take 20 mg by mouth 2 (two) times daily before a meal.  05/22/19   [provider]  potassium chloride SA (KLOR-CON) 20 MEQ tablet Take 1 tablet (20 mEq total) by mouth 2 (two) times daily. 10/04/19   Minna Antis, MD  QUEtiapine (SEROQUEL) 300 MG tablet Take 1 tablet (300 mg total) by mouth at bedtime. 10/04/19   Minna Antis, MD    Allergies Patient has no known allergies.  No family history on file.  Social History  Social History   Tobacco Use  . Smoking status: Current Every Day Smoker  . Smokeless tobacco: Never Used  Substance Use Topics  . Alcohol use: Yes  . Drug use: Not Currently    Types: Marijuana    Review of Systems Constitutional: No fever/chills Eyes: No visual changes. ENT: No sore throat. Cardiovascular: Denies chest pain. Respiratory: Denies shortness of breath. Gastrointestinal: No abdominal pain.  No nausea, no vomiting.  No diarrhea.  No constipation. Genitourinary: Negative for dysuria. Musculoskeletal: Negative for neck pain.  Negative for  back pain. Integumentary: Negative for rash. Neurological: Transient altered mental status, now improved.  Negative for headaches, focal weakness or numbness.   ____________________________________________   PHYSICAL EXAM:  VITAL SIGNS: ED Triage Vitals  Enc Vitals Group     BP 11/27/19 0423 131/90     Pulse Rate 11/27/19 0423 97     Resp 11/27/19 0423 18     Temp 11/27/19 0423 97.9 F (36.6 C)     Temp Source 11/27/19 0423 Oral     SpO2 11/27/19 0423 98 %     Weight 11/27/19 0420 68 kg (150 lb)     Height 11/27/19 0420 1.702 m (5\' 7" )     Head Circumference --      Peak Flow --      Pain Score 11/27/19 0420 0     Pain Loc --      Pain Edu? --      Excl. in GC? --     Constitutional: Alert and oriented.  Appears older than chronological age but in no distress. Eyes: Conjunctivae are normal.  Pupils are equal and reactive, extraocular movement is intact. Head: Patient seems to have a mild contusion on the left side of her face but is only minimally tender to palpation and she has no headache. Nose: No congestion/rhinnorhea. Mouth/Throat: Patient is wearing a mask. Neck: No stridor.  No meningeal signs.   Cardiovascular: Normal rate, regular rhythm. Good peripheral circulation. Grossly normal heart sounds. Respiratory: Normal respiratory effort.  No retractions. Gastrointestinal: Soft and nontender. No distention.  Musculoskeletal: No cervical spine tenderness to palpation including no pain with flexion, extension, and rotation of her head and neck side to side.  No lower extremity tenderness nor edema. No gross deformities of extremities. Neurologic:  Normal speech and language. No gross focal neurologic deficits are appreciated.  Skin:  Skin is warm, dry and intact. Psychiatric: Mood and affect are normal. Speech and behavior are normal.  ____________________________________________   LABS (all labs ordered are listed, but only abnormal results are displayed)  Labs  Reviewed  COMPREHENSIVE METABOLIC PANEL - Abnormal; Notable for the following components:      Result Value   Glucose, Bld 132 (*)    Creatinine, Ser 1.11 (*)    GFR calc non Af Amer 58 (*)    All other components within normal limits  URINALYSIS, ROUTINE W REFLEX MICROSCOPIC - Abnormal; Notable for the following components:   Leukocytes,Ua TRACE (*)    All other components within normal limits  URINE DRUG SCREEN, QUALITATIVE ([facility] ONLY) - Abnormal; Notable for the following components:   Tricyclic, Ur Screen POSITIVE (*)    Cocaine Metabolite,Ur [place] POSITIVE (*)    All other components within normal limits  CBC WITH DIFFERENTIAL/PLATELET  ETHANOL  MAGNESIUM  LACTIC ACID, PLASMA  URINALYSIS, MICROSCOPIC (REFLEX)   ____________________________________________  EKG  No indication for EKG ____________________________________________  RADIOLOGY 11/29/19, personally viewed and evaluated these  images (plain radiographs) as part of my medical decision making, as well as reviewing the written report by the radiologist.  ED MD interpretation: Nonacute head CT.  Official radiology report(s): CT Head Wo Contrast  Result Date: 11/27/2019 CLINICAL DATA:  Altered mental status EXAM: CT HEAD WITHOUT CONTRAST TECHNIQUE: Contiguous axial images were obtained from the base of the skull through the vertex without intravenous contrast. COMPARISON:  October 04, 2019 FINDINGS: Brain: No evidence of acute territorial infarction, hemorrhage, hydrocephalus,extra-axial collection or mass lesion/mass effect. Again noted is area of encephalomalacia involving the right frontotemporal lobe. There is dilatation the ventricles and sulci consistent with age-related atrophy. Low-attenuation changes in the deep white matter consistent with small vessel ischemia. Vascular: No hyperdense vessel or unexpected calcification. Skull: The skull is intact. No fracture or focal lesion identified. Sinuses/Orbits: The  visualized paranasal sinuses and mastoid air cells are clear. The orbits and globes intact. Other: None IMPRESSION: No acute intracranial abnormality. Area of encephalomalacia involving the right frontotemporal lobe. Findings consistent with mild age related atrophy and chronic small vessel ischemia Electronically Signed   By: Prudencio Pair M.D.   On: 11/27/2019 06:09    ____________________________________________   PROCEDURES   Procedure(s) performed (including Critical Care):  Procedures   ____________________________________________   INITIAL IMPRESSION / MDM / [place] / ED COURSE  As part of my medical decision making, I reviewed the following data within the [place] notes reviewed and incorporated, Labs reviewed , Old chart reviewed, Notes from prior ED visits and [place] Controlled Substance Database   Differential diagnosis includes, but is not limited to, drug abuse, alcohol abuse, seizure with postictal state, acute intracranial bleeding, electrolyte or metabolic abnormality, medication side effect.  Acute infection is also possible but the patient has no signs or symptoms of acute infection.  Vital signs are stable and within normal limits and she is afebrile.  Urine drug screen is pending.  Urinalysis is reassuring with no sign of infection, lactic acid is within normal limits, ethanol is negative.  Comprehensive metabolic panel is also within normal limits.  The patient says she does not want to go back on the Depakote because she thinks it will make her hair fall out.  She agreed to a dose of Keppra 1000 mg IV for the possibility that she could have had a seizure but I do not think that this is an alcohol withdrawal seizure because she has never had complicated withdrawal and because she has not been drinking for weeks if not months.  She is comfortable with the plan for discharge and outpatient follow-up with her doctor with a prescription  for Keppra once she is done with the 1 g IV bolus.  She has been observed in the emergency department for nearly 4 hours and has had no other concerning signs or symptoms.  I think she is appropriate for discharge and follow-up and I gave my usual and customary return precautions.       Clinical Course as of Nov 27 799  Mon Nov 27, 2019  0533 Cocaine Metabolite,Ur [place](!): POSITIVE [CF]    Clinical Course User Index [CF] Hinda Kehr, MD     ____________________________________________  FINAL CLINICAL IMPRESSION(S) / ED DIAGNOSES  Final diagnoses:  Transient alteration of awareness  Cocaine abuse [facility])     MEDICATIONS GIVEN DURING THIS VISIT:  Medications  levETIRAcetam (KEPPRA) IVPB 1000 mg/100 mL premix (1,000 mg Intravenous New Bag/Given 11/27/19 0729)     ED  Discharge Orders         Ordered    levETIRAcetam (KEPPRA) 500 MG tablet  2 times daily     11/27/19 0754          *Please note:  Chrishawna Farina was evaluated in Emergency Department on 11/27/2019 for the symptoms described in the history of present illness. She was evaluated in the context of the global COVID-19 pandemic, which necessitated consideration that the patient might be at risk for infection with the SARS-CoV-2 virus that causes COVID-19. Institutional protocols and algorithms that pertain to the evaluation of patients at risk for COVID-19 are in a state of rapid change based on information released by regulatory bodies including the CDC and federal and state organizations. These policies and algorithms were followed during the patient's care in the ED.  Some ED evaluations and interventions may be delayed as a result of limited staffing during the pandemic.*  Note:  This document was prepared using Dragon voice recognition software and may include unintentional dictation errors.   [name], MD 11/27/19 [phone]

## 2019-12-27 ENCOUNTER — Other Ambulatory Visit: Payer: Self-pay

## 2019-12-27 ENCOUNTER — Encounter: Payer: Self-pay | Admitting: Emergency Medicine

## 2019-12-27 DIAGNOSIS — K0889 Other specified disorders of teeth and supporting structures: Secondary | ICD-10-CM | POA: Diagnosis not present

## 2019-12-27 DIAGNOSIS — Z5321 Procedure and treatment not carried out due to patient leaving prior to being seen by health care provider: Secondary | ICD-10-CM | POA: Diagnosis not present

## 2019-12-27 DIAGNOSIS — R569 Unspecified convulsions: Secondary | ICD-10-CM | POA: Insufficient documentation

## 2019-12-27 LAB — GLUCOSE, CAPILLARY: Glucose-Capillary: 122 mg/dL — ABNORMAL HIGH (ref 70–99)

## 2019-12-27 NOTE — ED Triage Notes (Signed)
Pt presents to ED via EMS from urgent care with possible seizure like activity. Pt sates she went to be seen for an abscessed tooth and her right hand started to shake and she was drooling. Urgent care staff called EMS to have pt evaluated. Pt states she typically takes her prescribed medication for her seizures but did miss a dose today. Pt has slurred speech and is very difficult to understand during triage.

## 2019-12-27 NOTE — ED Notes (Signed)
Pt states she refuses to have her blood drawn until a doctor can use the ultrasound machine. Pt states she is a difficult stick. One failed attempt by ED tech due to pt moving arm repeatedly.

## 2019-12-28 ENCOUNTER — Emergency Department
Admission: EM | Admit: 2019-12-28 | Discharge: 2019-12-28 | Disposition: A | Discharge status: Home / Self Care | Payer: Medicare Other | Attending: Emergency Medicine | Admitting: Emergency Medicine

## 2019-12-28 HISTORY — DX: Unspecified asthma, uncomplicated: J45.909

## 2019-12-28 NOTE — ED Notes (Signed)
No answer when called several times from lobby 

## 2019-12-28 NOTE — ED Notes (Signed)
Pt noted leaving ED lobby 

## 2019-12-28 NOTE — ED Notes (Signed)
Pt has not returned

## 2020-08-04 ENCOUNTER — Emergency Department
Admission: EM | Admit: 2020-08-04 | Discharge: 2020-08-04 | Discharge status: Against Medical Advice | Payer: Medicare Other

## 2020-08-04 ENCOUNTER — Other Ambulatory Visit: Payer: Self-pay

## 2020-08-04 NOTE — ED Notes (Signed)
Pt left to get outpatient COVID testing.

## 2020-09-18 IMAGING — CT CT HEAD W/O CM
5 of 10 series · 16 of 47 positions shown, 17 images · non-contrast
Comparison: None.

CLINICAL DATA: Recent fall, recent seizure activity

EXAM:
CT HEAD WITHOUT CONTRAST
CT CERVICAL SPINE WITHOUT CONTRAST
TECHNIQUE: Multidetector CT imaging of the head and cervical spine was
performed following the standard protocol without intravenous
contrast. Multiplanar CT image reconstructions of the cervical spine
were also generated.

[Series 2: head bone · axial · 0.40mm/px · z∈[-133,-61]mm · 3 of 72 slices shown]
[im 18/72  bone]
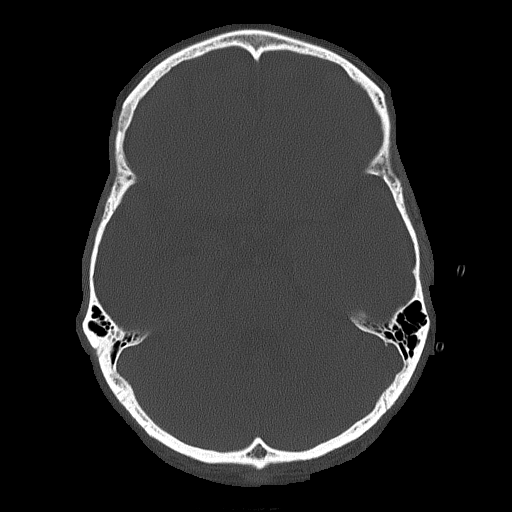
[im 36/72  bone]
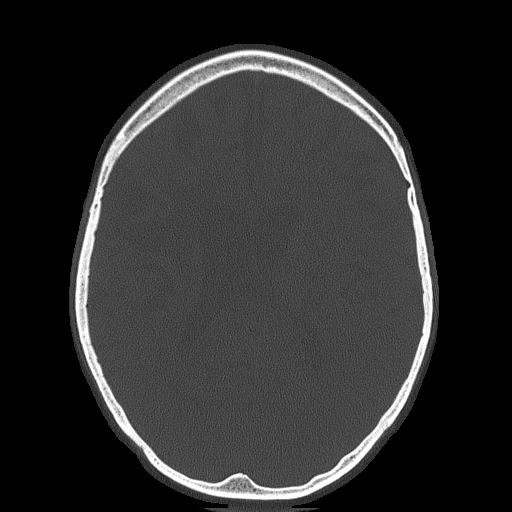
[im 54/72  bone]
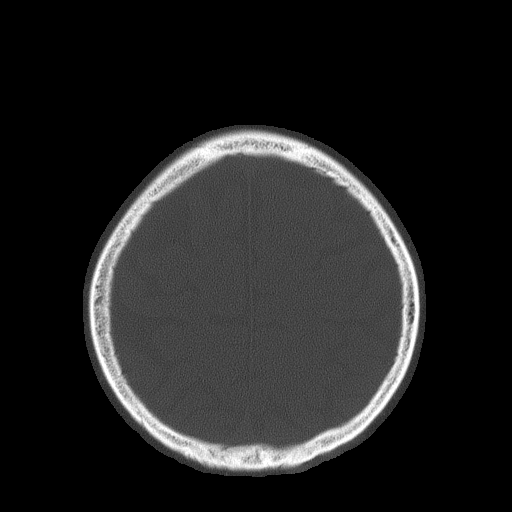

[Series 3: head wo · axial · 0.40mm/px · z∈[-167,-27]mm · 3 of 29 slices shown, 4 images]
[im 1/29  brain]
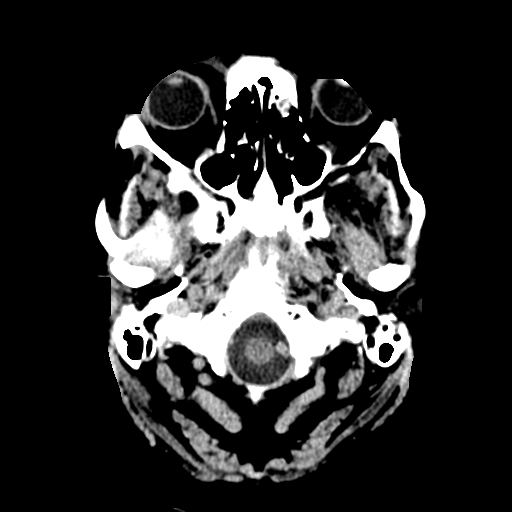
[im 1/29  bone]
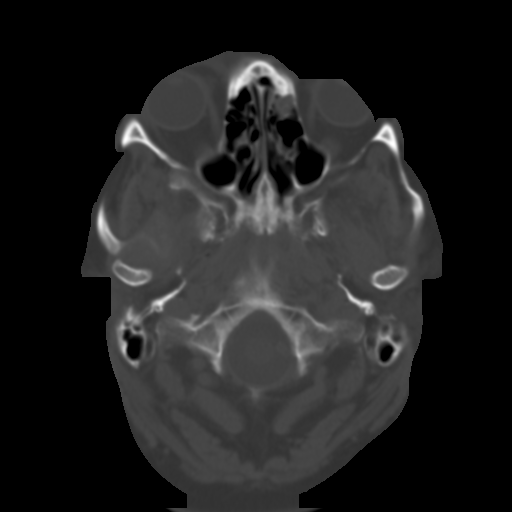
[im 15/29  brain]
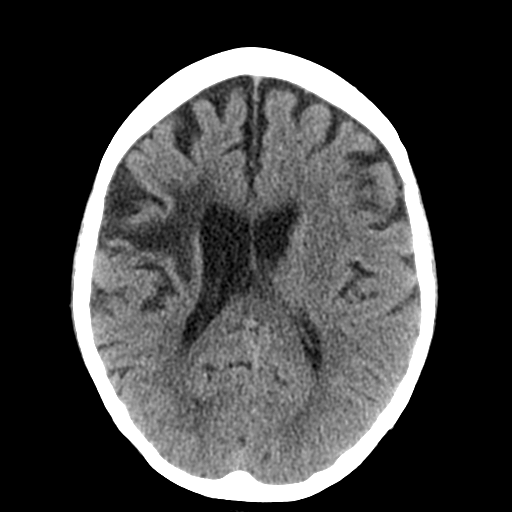
[im 29/29  brain]
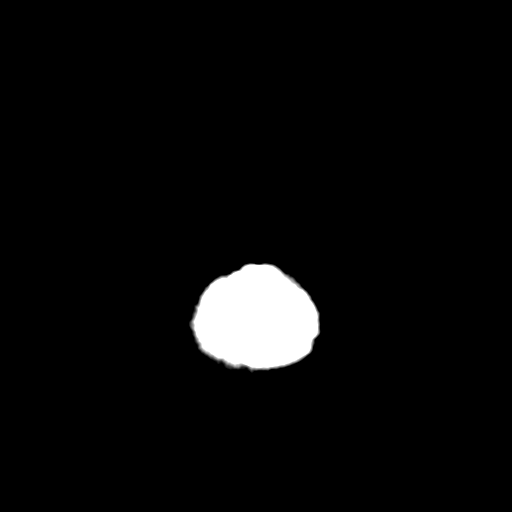

[Series 6: coronal soft tissue · coronal · 0.31mm/px · 3 of 62 slices shown]
[im 18/62  brain]
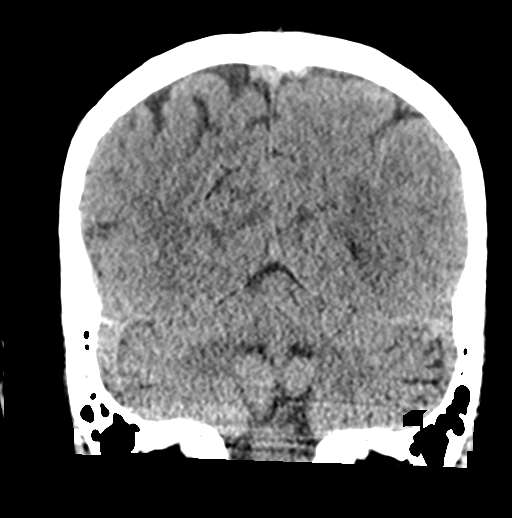
[im 27/62  brain]
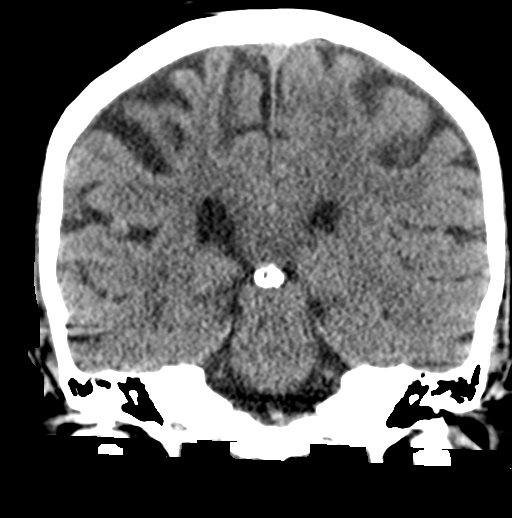
[im 35/62  brain]
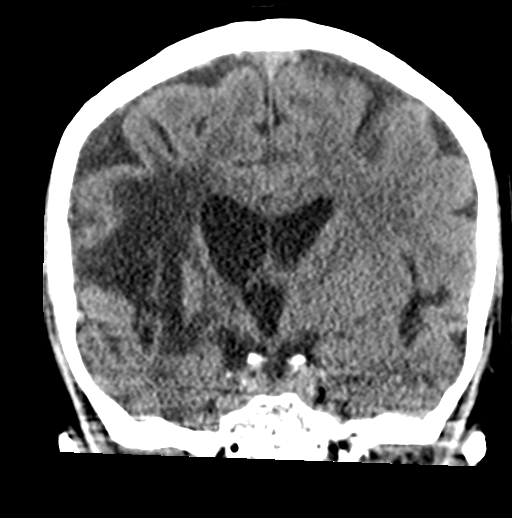

[Series 7: sagittal soft tissue · sagittal · 0.31mm/px · 1 of 53 slices shown]
[im 27/53  brain]
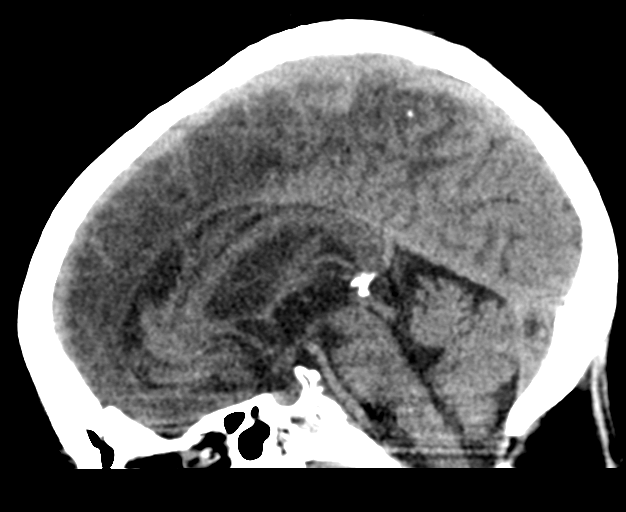

[Series 17: orthogonal bone · axial · 0.20mm/px · z∈[-312,-214]mm · 6 of 85 slices shown]
[im 13/85  bone]
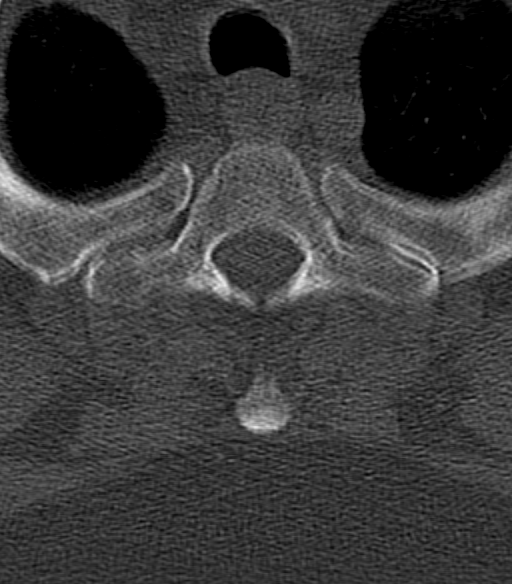
[im 25/85  bone]
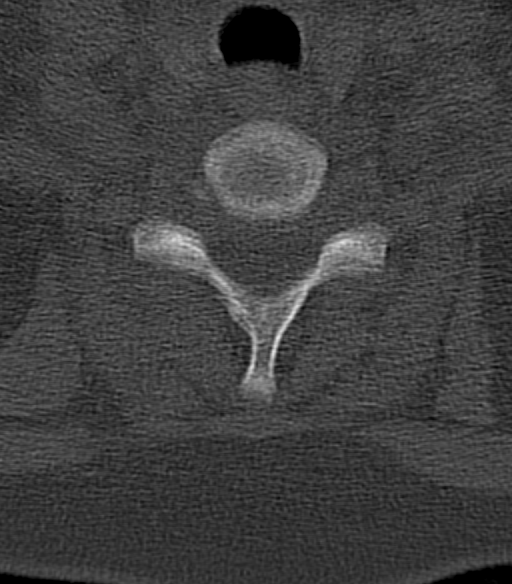
[im 37/85  bone]
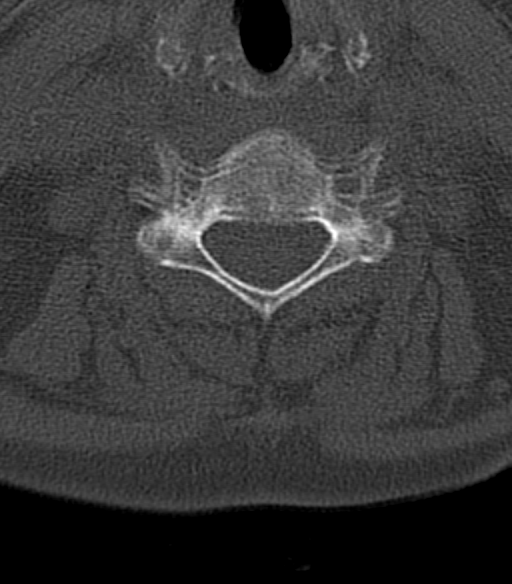
[im 49/85  bone]
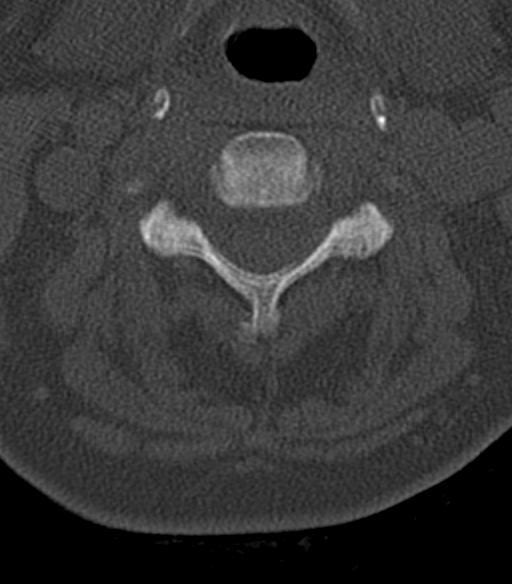
[im 61/85  bone]
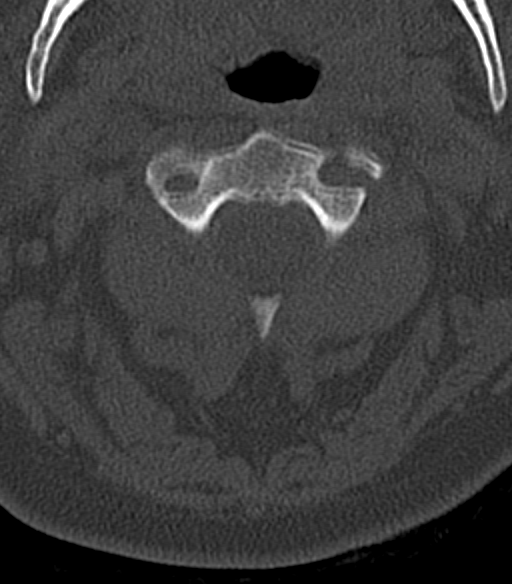
[im 73/85  bone]
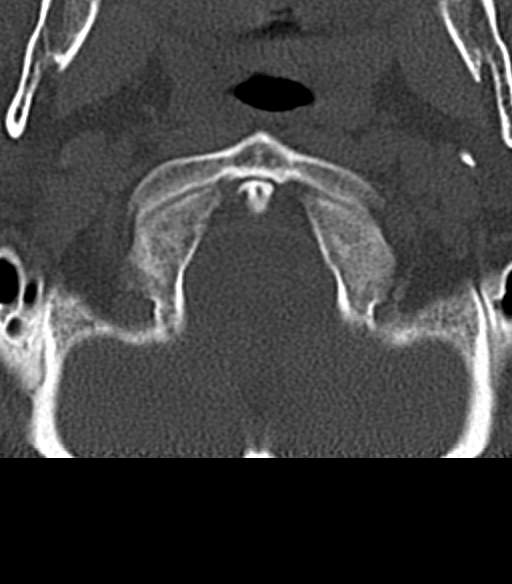

[16 of 47 positions shown; findings below may reference images not displayed]

FINDINGS: CT HEAD FINDINGS

Brain: Mild atrophic changes are noted. Findings consistent with
prior right MCA infarct are noted with encephalomalacia change. No
findings to suggest acute hemorrhage, acute infarction or
space-occupying mass lesion are noted.

Vascular: No hyperdense vessel or unexpected calcification.

Skull: Normal. Negative for fracture or focal lesion.

Sinuses/Orbits: No acute finding.

Other: None.

CT CERVICAL SPINE FINDINGS

Alignment: Within normal limits.

Skull base and vertebrae: Exam is somewhat limited by patient motion
artifact. No acute fracture or acute facet abnormality is noted.
Seven cervical segments are well visualized. The odontoid is within
normal limits.

Soft tissues and spinal canal: Surrounding soft tissue structures
are within normal limits

Upper chest: Visualized lung apices are within normal limits.

Other: None
IMPRESSION: CT of the head: Mild atrophic changes and changes consistent with
prior right MCA infarct. No acute abnormality is noted.

CT of the cervical spine: Mild motion artifact. No acute abnormality
is noted.

## 2020-09-18 IMAGING — DX DG CHEST 1V PORT
1 series · 1 of 1 positions shown · non-contrast
Comparison: None.

CLINICAL DATA: 47-year-old female status post seizure.

EXAM:
PORTABLE CHEST 1 VIEW

[chest ap]
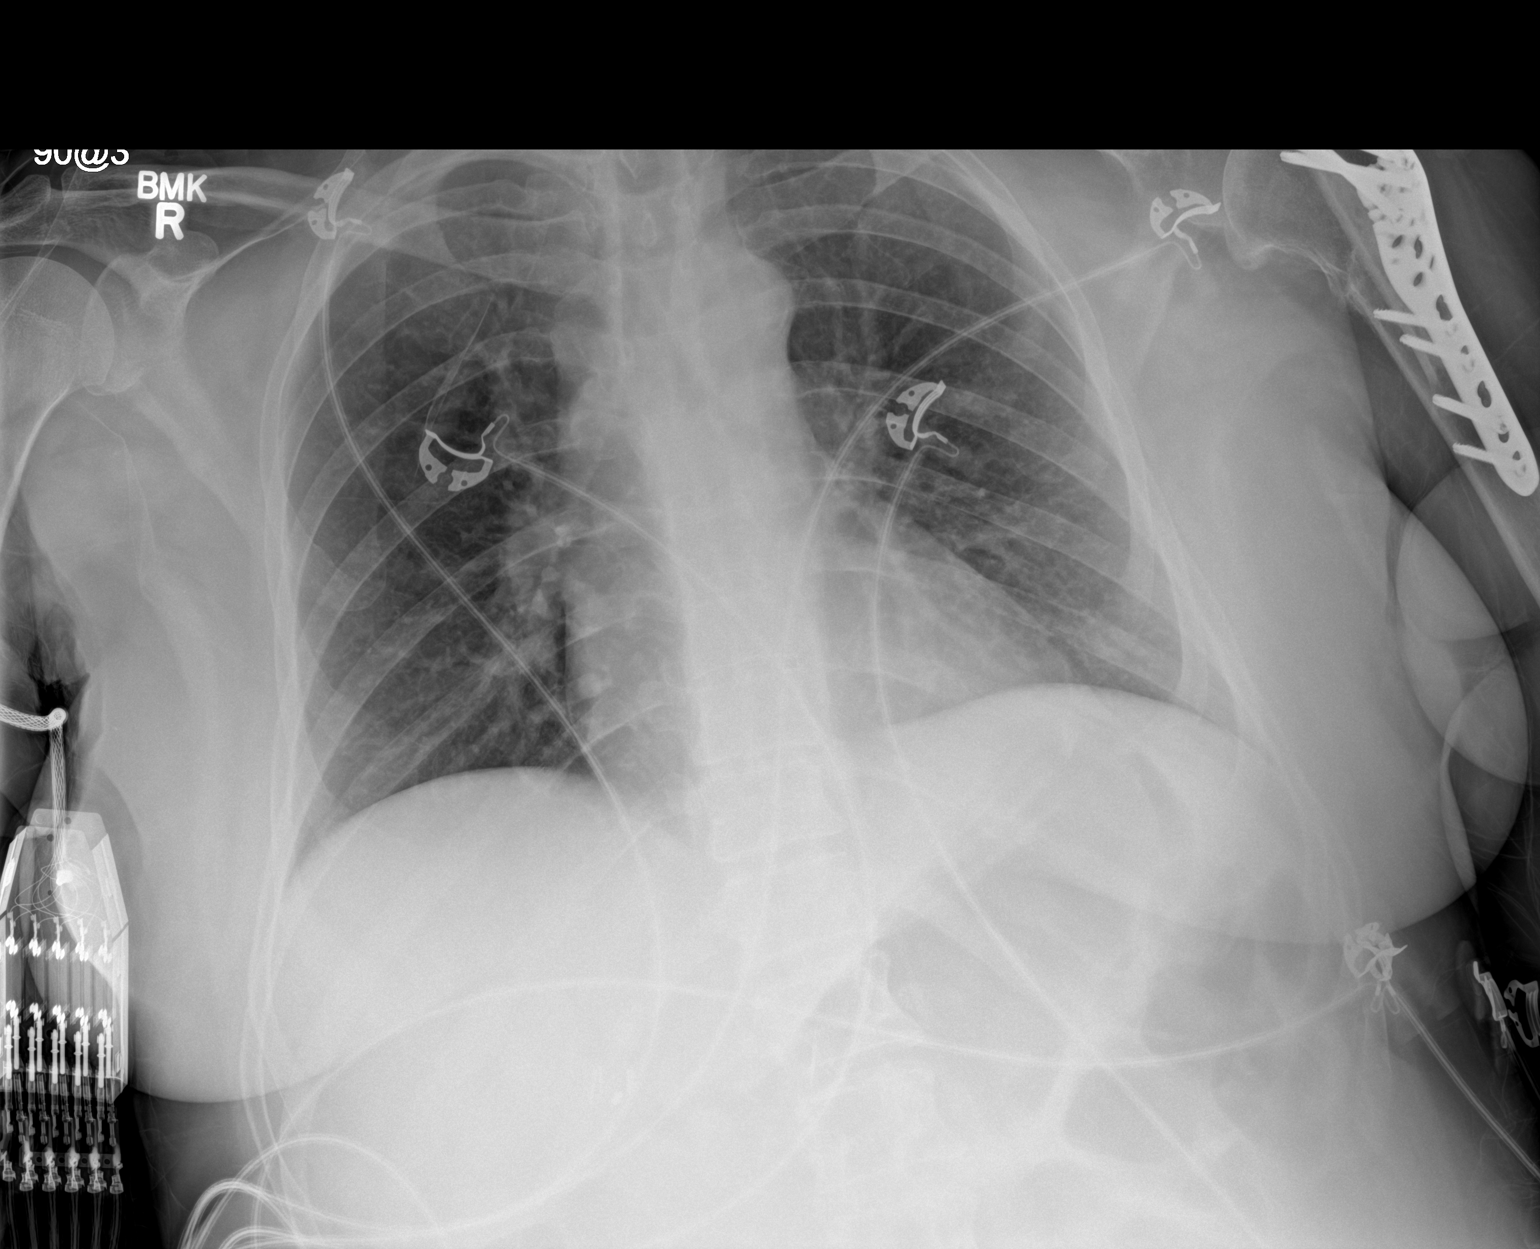

[1 of 1 positions shown; findings below may reference images not displayed]

FINDINGS: Portable AP upright view at 7777 hours. Lung volumes and mediastinal
contours are within normal limits. Visualized tracheal air column is
within normal limits. Allowing for portable technique the lungs are
clear. Proximal left humerus ORIF. Partially healed distal left
clavicle shaft fracture.

No superimposed No acute osseous abnormality identified. Negative
visible bowel gas pattern. Cholecystectomy clips.
IMPRESSION: 1. No acute cardiopulmonary abnormality.
2. Partially healed distal left clavicle fracture.

## 2021-01-25 ENCOUNTER — Emergency Department (HOSPITAL_COMMUNITY): Payer: Medicare Other

## 2021-01-25 ENCOUNTER — Inpatient Hospital Stay (HOSPITAL_COMMUNITY): Payer: Medicare Other

## 2021-01-25 ENCOUNTER — Inpatient Hospital Stay (HOSPITAL_COMMUNITY)
Admission: EM | Admit: 2021-01-25 | Discharge: 2021-02-11 | DRG: 957 | Disposition: A | Discharge status: Skilled Nursing Facility | Payer: Medicare Other | Attending: Surgery | Admitting: Surgery

## 2021-01-25 ENCOUNTER — Inpatient Hospital Stay (HOSPITAL_COMMUNITY): Payer: Medicare Other | Admitting: Registered Nurse

## 2021-01-25 ENCOUNTER — Encounter (HOSPITAL_COMMUNITY): Admission: EM | Disposition: A | Discharge status: Skilled Nursing Facility | Payer: Self-pay | Source: Home / Self Care

## 2021-01-25 ENCOUNTER — Other Ambulatory Visit: Payer: Self-pay

## 2021-01-25 ENCOUNTER — Encounter (HOSPITAL_COMMUNITY): Payer: Self-pay | Admitting: Emergency Medicine

## 2021-01-25 DIAGNOSIS — R1310 Dysphagia, unspecified: Secondary | ICD-10-CM | POA: Diagnosis present

## 2021-01-25 DIAGNOSIS — S92001B Unspecified fracture of right calcaneus, initial encounter for open fracture: Secondary | ICD-10-CM | POA: Diagnosis present

## 2021-01-25 DIAGNOSIS — S42109A Fracture of unspecified part of scapula, unspecified shoulder, initial encounter for closed fracture: Secondary | ICD-10-CM

## 2021-01-25 DIAGNOSIS — S42002A Fracture of unspecified part of left clavicle, initial encounter for closed fracture: Secondary | ICD-10-CM

## 2021-01-25 DIAGNOSIS — E876 Hypokalemia: Secondary | ICD-10-CM | POA: Diagnosis not present

## 2021-01-25 DIAGNOSIS — S42111A Displaced fracture of body of scapula, right shoulder, initial encounter for closed fracture: Secondary | ICD-10-CM | POA: Diagnosis present

## 2021-01-25 DIAGNOSIS — Z4659 Encounter for fitting and adjustment of other gastrointestinal appliance and device: Secondary | ICD-10-CM

## 2021-01-25 DIAGNOSIS — S92411A Displaced fracture of proximal phalanx of right great toe, initial encounter for closed fracture: Secondary | ICD-10-CM | POA: Diagnosis present

## 2021-01-25 DIAGNOSIS — S32401A Unspecified fracture of right acetabulum, initial encounter for closed fracture: Secondary | ICD-10-CM | POA: Diagnosis present

## 2021-01-25 DIAGNOSIS — Z22322 Carrier or suspected carrier of Methicillin resistant Staphylococcus aureus: Secondary | ICD-10-CM | POA: Diagnosis not present

## 2021-01-25 DIAGNOSIS — Z8673 Personal history of transient ischemic attack (TIA), and cerebral infarction without residual deficits: Secondary | ICD-10-CM

## 2021-01-25 DIAGNOSIS — T148XXA Other injury of unspecified body region, initial encounter: Secondary | ICD-10-CM

## 2021-01-25 DIAGNOSIS — S32591A Other specified fracture of right pubis, initial encounter for closed fracture: Secondary | ICD-10-CM | POA: Diagnosis present

## 2021-01-25 DIAGNOSIS — S82391B Other fracture of lower end of right tibia, initial encounter for open fracture type I or II: Principal | ICD-10-CM | POA: Diagnosis present

## 2021-01-25 DIAGNOSIS — R339 Retention of urine, unspecified: Secondary | ICD-10-CM | POA: Diagnosis not present

## 2021-01-25 DIAGNOSIS — S82201B Unspecified fracture of shaft of right tibia, initial encounter for open fracture type I or II: Secondary | ICD-10-CM

## 2021-01-25 DIAGNOSIS — Z79899 Other long term (current) drug therapy: Secondary | ICD-10-CM

## 2021-01-25 DIAGNOSIS — S32409A Unspecified fracture of unspecified acetabulum, initial encounter for closed fracture: Secondary | ICD-10-CM | POA: Diagnosis present

## 2021-01-25 DIAGNOSIS — M549 Dorsalgia, unspecified: Secondary | ICD-10-CM

## 2021-01-25 DIAGNOSIS — Z7951 Long term (current) use of inhaled steroids: Secondary | ICD-10-CM | POA: Diagnosis not present

## 2021-01-25 DIAGNOSIS — S82141A Displaced bicondylar fracture of right tibia, initial encounter for closed fracture: Secondary | ICD-10-CM

## 2021-01-25 DIAGNOSIS — S42112A Displaced fracture of body of scapula, left shoulder, initial encounter for closed fracture: Secondary | ICD-10-CM | POA: Diagnosis present

## 2021-01-25 DIAGNOSIS — Z20822 Contact with and (suspected) exposure to covid-19: Secondary | ICD-10-CM | POA: Diagnosis present

## 2021-01-25 DIAGNOSIS — D62 Acute posthemorrhagic anemia: Secondary | ICD-10-CM | POA: Diagnosis not present

## 2021-01-25 DIAGNOSIS — K661 Hemoperitoneum: Secondary | ICD-10-CM | POA: Diagnosis present

## 2021-01-25 DIAGNOSIS — J449 Chronic obstructive pulmonary disease, unspecified: Secondary | ICD-10-CM | POA: Diagnosis present

## 2021-01-25 DIAGNOSIS — Z23 Encounter for immunization: Secondary | ICD-10-CM

## 2021-01-25 DIAGNOSIS — S82401B Unspecified fracture of shaft of right fibula, initial encounter for open fracture type I or II: Secondary | ICD-10-CM | POA: Diagnosis present

## 2021-01-25 DIAGNOSIS — S42009A Fracture of unspecified part of unspecified clavicle, initial encounter for closed fracture: Secondary | ICD-10-CM

## 2021-01-25 DIAGNOSIS — Z419 Encounter for procedure for purposes other than remedying health state, unspecified: Secondary | ICD-10-CM

## 2021-01-25 DIAGNOSIS — S82201C Unspecified fracture of shaft of right tibia, initial encounter for open fracture type IIIA, IIIB, or IIIC: Secondary | ICD-10-CM

## 2021-01-25 DIAGNOSIS — T1490XA Injury, unspecified, initial encounter: Secondary | ICD-10-CM

## 2021-01-25 DIAGNOSIS — F17210 Nicotine dependence, cigarettes, uncomplicated: Secondary | ICD-10-CM | POA: Diagnosis present

## 2021-01-25 DIAGNOSIS — S91309A Unspecified open wound, unspecified foot, initial encounter: Secondary | ICD-10-CM

## 2021-01-25 DIAGNOSIS — S92041B Displaced other fracture of tuberosity of right calcaneus, initial encounter for open fracture: Secondary | ICD-10-CM

## 2021-01-25 HISTORY — PX: I & D EXTREMITY: SHX5045

## 2021-01-25 HISTORY — DX: Chronic obstructive pulmonary disease, unspecified: J44.9

## 2021-01-25 HISTORY — DX: Unspecified asthma, uncomplicated: J45.909

## 2021-01-25 HISTORY — DX: Cerebral infarction, unspecified: I63.9

## 2021-01-25 LAB — ABO/RH: ABO/RH(D): AB NEG

## 2021-01-25 LAB — COMPREHENSIVE METABOLIC PANEL
ALT: 35 U/L (ref 0–44)
AST: 49 U/L — ABNORMAL HIGH (ref 15–41)
Albumin: 3.2 g/dL — ABNORMAL LOW (ref 3.5–5.0)
Alkaline Phosphatase: 80 U/L (ref 38–126)
Anion gap: 11 (ref 5–15)
BUN: 13 mg/dL (ref 6–20)
CO2: 20 mmol/L — ABNORMAL LOW (ref 22–32)
Calcium: 8.7 mg/dL — ABNORMAL LOW (ref 8.9–10.3)
Chloride: 103 mmol/L (ref 98–111)
Creatinine, Ser: 1.16 mg/dL — ABNORMAL HIGH (ref 0.44–1.00)
GFR, Estimated: 57 mL/min — ABNORMAL LOW (ref >60)
Glucose, Bld: 124 mg/dL — ABNORMAL HIGH (ref 70–99)
Potassium: 4.8 mmol/L (ref 3.5–5.1)
Sodium: 134 mmol/L — ABNORMAL LOW (ref 135–145)
Total Bilirubin: 0.7 mg/dL (ref 0.3–1.2)
Total Protein: 6.9 g/dL (ref 6.5–8.1)

## 2021-01-25 LAB — SAMPLE TO BLOOD BANK

## 2021-01-25 LAB — RESP PANEL BY RT-PCR (FLU A&B, COVID) ARPGX2
Influenza A by PCR: NEGATIVE
Influenza B by PCR: NEGATIVE
SARS Coronavirus 2 by RT PCR: NEGATIVE

## 2021-01-25 LAB — CBC
HCT: 36.4 % (ref 36.0–46.0)
Hemoglobin: 12 g/dL (ref 12.0–15.0)
MCH: 30.2 pg (ref 26.0–34.0)
MCHC: 33 g/dL (ref 30.0–36.0)
MCV: 91.7 fL (ref 80.0–100.0)
Platelets: 532 10*3/uL — ABNORMAL HIGH (ref 150–400)
RBC: 3.97 MIL/uL (ref 3.87–5.11)
RDW: 14.6 % (ref 11.5–15.5)
WBC: 24.6 10*3/uL — ABNORMAL HIGH (ref 4.0–10.5)
nRBC: 0 % (ref 0.0–0.2)

## 2021-01-25 LAB — I-STAT CHEM 8, ED
BUN: 13 mg/dL (ref 6–20)
Calcium, Ion: 1.05 mmol/L — ABNORMAL LOW (ref 1.15–1.40)
Chloride: 105 mmol/L (ref 98–111)
Creatinine, Ser: 1.1 mg/dL — ABNORMAL HIGH (ref 0.44–1.00)
Glucose, Bld: 119 mg/dL — ABNORMAL HIGH (ref 70–99)
HCT: 39 % (ref 36.0–46.0)
Hemoglobin: 13.3 g/dL (ref 12.0–15.0)
Potassium: 4.8 mmol/L (ref 3.5–5.1)
Sodium: 134 mmol/L — ABNORMAL LOW (ref 135–145)
TCO2: 23 mmol/L (ref 22–32)

## 2021-01-25 LAB — PROTIME-INR
INR: 1 (ref 0.8–1.2)
Prothrombin Time: 13.1 seconds (ref 11.4–15.2)

## 2021-01-25 LAB — PREPARE RBC (CROSSMATCH)

## 2021-01-25 LAB — LACTIC ACID, PLASMA: Lactic Acid, Venous: 1.2 mmol/L (ref 0.5–1.9)

## 2021-01-25 SURGERY — IRRIGATION AND DEBRIDEMENT EXTREMITY
Anesthesia: General | Laterality: Right

## 2021-01-25 MED ORDER — FENTANYL CITRATE (PF) 250 MCG/5ML IJ SOLN
INTRAMUSCULAR | Status: AC
  Filled 2021-01-25: qty 5

## 2021-01-25 MED ORDER — CEFAZOLIN SODIUM-DEXTROSE 2-4 GM/100ML-% IV SOLN
2.0000 g | Freq: Once | INTRAVENOUS | Status: AC
  Administered 2021-01-25: 2 g via INTRAVENOUS

## 2021-01-25 MED ORDER — FENTANYL CITRATE (PF) 100 MCG/2ML IJ SOLN
50.0000 ug | Freq: Once | INTRAMUSCULAR | Status: AC
  Administered 2021-01-25: 50 ug via INTRAVENOUS

## 2021-01-25 MED ORDER — SODIUM CHLORIDE 0.9% IV SOLUTION: Freq: Once | INTRAVENOUS | Status: DC

## 2021-01-25 MED ORDER — TETANUS-DIPHTH-ACELL PERTUSSIS 5-2.5-18.5 LF-MCG/0.5 IM SUSY
0.5000 mL | PREFILLED_SYRINGE | Freq: Once | INTRAMUSCULAR | Status: AC
  Administered 2021-01-25: 0.5 mL via INTRAMUSCULAR

## 2021-01-25 MED ORDER — FENTANYL CITRATE (PF) 100 MCG/2ML IJ SOLN
INTRAMUSCULAR | Status: AC
  Filled 2021-01-25: qty 2

## 2021-01-25 MED ORDER — SODIUM CHLORIDE 0.9 % IV SOLN
INTRAVENOUS | Status: AC | PRN
  Administered 2021-01-25: 1000 mL via INTRAVENOUS

## 2021-01-25 MED ORDER — IOHEXOL 300 MG/ML  SOLN
75.0000 mL | Freq: Once | INTRAMUSCULAR | Status: AC | PRN
  Administered 2021-01-25: 75 mL via INTRAVENOUS

## 2021-01-25 MED ORDER — MIDAZOLAM HCL 2 MG/2ML IJ SOLN
INTRAMUSCULAR | Status: AC
  Filled 2021-01-25: qty 2

## 2021-01-25 SURGICAL SUPPLY — 53 items
BANDAGE ESMARK 6X9 LF (GAUZE/BANDAGES/DRESSINGS) ×1 IMPLANT
BLADE SURG 10 STRL SS (BLADE) ×2 IMPLANT
BNDG ELASTIC 4X5.8 VLCR STR LF (GAUZE/BANDAGES/DRESSINGS) ×2 IMPLANT
BNDG ELASTIC 6X10 VLCR STRL LF (GAUZE/BANDAGES/DRESSINGS) ×2 IMPLANT
BNDG ESMARK 6X9 LF (GAUZE/BANDAGES/DRESSINGS) ×2
BNDG GAUZE ELAST 4 BULKY (GAUZE/BANDAGES/DRESSINGS) ×4 IMPLANT
CANISTER WOUNDNEG PRESSURE 500 (CANNISTER) ×2 IMPLANT
CONNECTOR Y ATS VAC SYSTEM (MISCELLANEOUS) ×2 IMPLANT
COVER SURGICAL LIGHT HANDLE (MISCELLANEOUS) ×2 IMPLANT
DRAPE DERMATAC (DRAPES) ×2 IMPLANT
DRESSING MEPILEX FLEX 4X4 (GAUZE/BANDAGES/DRESSINGS) ×1 IMPLANT
DRSG MEPILEX FLEX 4X4 (GAUZE/BANDAGES/DRESSINGS) ×2
DRSG MEPILEX SACRM 8.7X9.8 (GAUZE/BANDAGES/DRESSINGS) ×2 IMPLANT
DRSG PAD ABDOMINAL 8X10 ST (GAUZE/BANDAGES/DRESSINGS) ×2 IMPLANT
DRSG VAC ATS MED SENSATRAC (GAUZE/BANDAGES/DRESSINGS) ×2 IMPLANT
FACESHIELD WRAPAROUND (MASK) ×4 IMPLANT
GAUZE SPONGE 4X4 12PLY STRL (GAUZE/BANDAGES/DRESSINGS) ×2 IMPLANT
GAUZE XEROFORM 5X9 LF (GAUZE/BANDAGES/DRESSINGS) ×2 IMPLANT
GLOVE OPTIFIT SS 7.5 STRL LX (GLOVE) ×2 IMPLANT
GLOVE OPTIFIT SS 8.0 STRL (GLOVE) ×4 IMPLANT
GLOVE SURG ENC MOIS LTX SZ6.5 (GLOVE) ×4 IMPLANT
GLOVE SURG ENC TEXT LTX SZ6 (GLOVE) ×2 IMPLANT
GLOVE SURG ENC TEXT LTX SZ7 (GLOVE) ×4 IMPLANT
GLOVE SURG ORTHO LTX SZ7.5 (GLOVE) ×4 IMPLANT
GLOVE SURG ORTHO LTX SZ8.5 (GLOVE) ×6 IMPLANT
GLOVE SURG UNDER POLY LF SZ7.5 (GLOVE) ×4 IMPLANT
GLOVE SURG UNDER POLY LF SZ8.5 (GLOVE) ×2 IMPLANT
GOWN STRL REUS W/ TWL LRG LVL3 (GOWN DISPOSABLE) ×1 IMPLANT
GOWN STRL REUS W/ TWL XL LVL3 (GOWN DISPOSABLE) ×3 IMPLANT
GOWN STRL REUS W/TWL LRG LVL3 (GOWN DISPOSABLE) ×1
GOWN STRL REUS W/TWL XL LVL3 (GOWN DISPOSABLE) ×3
HANDPIECE INTERPULSE COAX TIP (DISPOSABLE) ×1
IV NS IRRIG 3000ML ARTHROMATIC (IV SOLUTION) ×6 IMPLANT
KIT BASIN OR (CUSTOM PROCEDURE TRAY) ×2 IMPLANT
MANIFOLD NEPTUNE II (INSTRUMENTS) ×2 IMPLANT
PACK ORTHO EXTREMITY (CUSTOM PROCEDURE TRAY) ×2 IMPLANT
PAD ABD 8X10 STRL (GAUZE/BANDAGES/DRESSINGS) ×2 IMPLANT
PAD CAST 4YDX4 CTTN HI CHSV (CAST SUPPLIES) ×1 IMPLANT
PADDING CAST ABS 6INX4YD NS (CAST SUPPLIES) ×1
PADDING CAST ABS COTTON 6X4 NS (CAST SUPPLIES) ×1 IMPLANT
PADDING CAST COTTON 4X4 STRL (CAST SUPPLIES) ×1
PADDING CAST SYN 6 (CAST SUPPLIES) ×1
PADDING CAST SYNTHETIC 6X4 NS (CAST SUPPLIES) ×1 IMPLANT
SET HNDPC FAN SPRY TIP SCT (DISPOSABLE) ×1 IMPLANT
SOL PREP PROV IODINE SCRUB 4OZ (MISCELLANEOUS) ×2 IMPLANT
SPLINT PLASTER CAST XFAST 5X30 (CAST SUPPLIES) ×1 IMPLANT
SPLINT PLASTER XFAST SET 5X30 (CAST SUPPLIES) ×1
SPONGE LAP 18X18 X RAY DECT (DISPOSABLE) ×12 IMPLANT
SUT ETHILON 2 0 PSLX (SUTURE) ×16 IMPLANT
SUT VIC AB 1 CT1 27 (SUTURE) ×2
SUT VIC AB 1 CT1 27XBRD ANBCTR (SUTURE) ×2 IMPLANT
SYR CONTROL 10ML LL (SYRINGE) ×2 IMPLANT
TOWEL GREEN STERILE (TOWEL DISPOSABLE) ×4 IMPLANT

## 2021-01-25 NOTE — ED Notes (Signed)
Returned from CT.

## 2021-01-25 NOTE — Progress Notes (Signed)
Orthopedic Tech Progress Note Patient Details:  Sheela Mcculley Sep 12, 1970 [id number]  Ortho Devices Type of Ortho Device: Post (short leg) splint Ortho Device/Splint Location: rle. I applied a posterior short leg splint with dr's assist to stabilize leg until surgery. Ortho Device/Splint Interventions: Ordered, Application, Adjustment   Post Interventions Patient Tolerated: Well Instructions Provided: Care of device, Adjustment of device  Trinna Post 01/25/2021, 9:27 PM
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
## 2021-01-25 NOTE — Anesthesia Preprocedure Evaluation (Signed)
Anesthesia Evaluation  Patient identified by MRN, date of birth, ID band Patient awake    Reviewed: Allergy & Precautions, NPO status   Airway Mallampati: II  TM Distance: >3 FB     Dental   Pulmonary Current Smoker and Patient abstained from smoking.,    breath sounds clear to auscultation       Cardiovascular negative cardio ROS   Rhythm:Regular Rate:Normal     Neuro/Psych    GI/Hepatic negative GI ROS, Neg liver ROS,   Endo/Other  negative endocrine ROS  Renal/GU negative Renal ROS     Musculoskeletal   Abdominal   Peds  Hematology   Anesthesia Other Findings   Reproductive/Obstetrics                             Anesthesia Physical Anesthesia Plan  ASA: 2  Anesthesia Plan: General   Post-op Pain Management:    Induction: Intravenous  PONV Risk Score and Plan: 3 and Ondansetron, Dexamethasone and Midazolam  Airway Management Planned: Oral ETT  Additional Equipment:   Intra-op Plan:   Post-operative Plan: Extubation in OR  Informed Consent: I have reviewed the patients History and Physical, chart, labs and discussed the procedure including the risks, benefits and alternatives for the proposed anesthesia with the patient or authorized representative who has indicated his/her understanding and acceptance.     Dental advisory given  Plan Discussed with: CRNA and Anesthesiologist  Anesthesia Plan Comments:         Anesthesia Quick Evaluation

## 2021-01-25 NOTE — ED Notes (Signed)
Patient transported to CT with Trauma RN and Trauma surgeon
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
## 2021-01-25 NOTE — ED Notes (Signed)
Niece Herbert Seta [phone] would like an update

## 2021-01-26 ENCOUNTER — Inpatient Hospital Stay (HOSPITAL_COMMUNITY): Payer: Medicare Other

## 2021-01-26 ENCOUNTER — Encounter (HOSPITAL_COMMUNITY): Payer: Self-pay | Admitting: Orthopedic Surgery

## 2021-01-26 LAB — HIV ANTIBODY (ROUTINE TESTING W REFLEX): HIV Screen 4th Generation wRfx: NONREACTIVE

## 2021-01-26 LAB — BASIC METABOLIC PANEL
Anion gap: 9 (ref 5–15)
BUN: 12 mg/dL (ref 6–20)
CO2: 20 mmol/L — ABNORMAL LOW (ref 22–32)
Calcium: 7.9 mg/dL — ABNORMAL LOW (ref 8.9–10.3)
Chloride: 106 mmol/L (ref 98–111)
Creatinine, Ser: 1.24 mg/dL — ABNORMAL HIGH (ref 0.44–1.00)
GFR, Estimated: 53 mL/min — ABNORMAL LOW (ref >60)
Glucose, Bld: 136 mg/dL — ABNORMAL HIGH (ref 70–99)
Potassium: 4.1 mmol/L (ref 3.5–5.1)
Sodium: 135 mmol/L (ref 135–145)

## 2021-01-26 LAB — POCT I-STAT, CHEM 8
BUN: 17 mg/dL (ref 6–20)
Calcium, Ion: 1.08 mmol/L — ABNORMAL LOW (ref 1.15–1.40)
Chloride: 104 mmol/L (ref 98–111)
Creatinine, Ser: 1.2 mg/dL — ABNORMAL HIGH (ref 0.44–1.00)
Glucose, Bld: 133 mg/dL — ABNORMAL HIGH (ref 70–99)
HCT: 29 % — ABNORMAL LOW (ref 36.0–46.0)
Hemoglobin: 9.9 g/dL — ABNORMAL LOW (ref 12.0–15.0)
Potassium: 5.3 mmol/L — ABNORMAL HIGH (ref 3.5–5.1)
Sodium: 137 mmol/L (ref 135–145)
TCO2: 25 mmol/L (ref 22–32)

## 2021-01-26 LAB — CBC
HCT: 27.2 % — ABNORMAL LOW (ref 36.0–46.0)
Hemoglobin: 9.1 g/dL — ABNORMAL LOW (ref 12.0–15.0)
MCH: 30.7 pg (ref 26.0–34.0)
MCHC: 33.5 g/dL (ref 30.0–36.0)
MCV: 91.9 fL (ref 80.0–100.0)
Platelets: 269 10*3/uL (ref 150–400)
RBC: 2.96 MIL/uL — ABNORMAL LOW (ref 3.87–5.11)
RDW: 14.5 % (ref 11.5–15.5)
WBC: 14.5 10*3/uL — ABNORMAL HIGH (ref 4.0–10.5)
nRBC: 0 % (ref 0.0–0.2)

## 2021-01-26 LAB — ETHANOL: Alcohol, Ethyl (B): <10 mg/dL (ref <10)

## 2021-01-26 LAB — BLOOD PRODUCT ORDER (VERBAL) VERIFICATION

## 2021-01-26 LAB — ABO/RH: ABO/RH(D): AB NEG

## 2021-01-26 MED ORDER — VASOPRESSIN 20 UNIT/ML IV SOLN
INTRAVENOUS | Status: AC
  Filled 2021-01-26: qty 1

## 2021-01-26 MED ORDER — SODIUM CHLORIDE 0.9 % IV SOLN
2.0000 g | INTRAVENOUS | Status: DC
  Administered 2021-01-26 – 2021-01-27 (×2): 2 g via INTRAVENOUS
  Filled 2021-01-26 (×2): qty 20

## 2021-01-26 MED ORDER — OXYCODONE HCL 5 MG PO TABS
5.0000 mg | ORAL_TABLET | ORAL | Status: DC | PRN
  Administered 2021-01-26 – 2021-01-29 (×4): 10 mg via ORAL
  Filled 2021-01-26 (×5): qty 2

## 2021-01-26 MED ORDER — METOCLOPRAMIDE HCL 10 MG PO TABS: 5.0000 mg | ORAL_TABLET | Freq: Three times a day (TID) | ORAL | Status: DC | PRN

## 2021-01-26 MED ORDER — ONDANSETRON HCL 4 MG PO TABS: 4.0000 mg | ORAL_TABLET | Freq: Four times a day (QID) | ORAL | Status: DC | PRN

## 2021-01-26 MED ORDER — ARIPIPRAZOLE ER 400 MG IM PRSY
400.0000 mg | PREFILLED_SYRINGE | INTRAMUSCULAR | Status: DC
  Filled 2021-01-26: qty 400

## 2021-01-26 MED ORDER — SUCCINYLCHOLINE CHLORIDE 200 MG/10ML IV SOSY
PREFILLED_SYRINGE | INTRAVENOUS | Status: AC
  Filled 2021-01-26: qty 30

## 2021-01-26 MED ORDER — DOCUSATE SODIUM 100 MG PO CAPS
100.0000 mg | ORAL_CAPSULE | Freq: Two times a day (BID) | ORAL | Status: DC
  Administered 2021-01-26 (×2): 100 mg via ORAL
  Filled 2021-01-26 (×2): qty 1

## 2021-01-26 MED ORDER — ATORVASTATIN CALCIUM 40 MG PO TABS
40.0000 mg | ORAL_TABLET | Freq: Every day | ORAL | Status: DC
  Administered 2021-01-26 – 2021-01-29 (×3): 40 mg via ORAL
  Filled 2021-01-26 (×3): qty 1

## 2021-01-26 MED ORDER — PHENYLEPHRINE HCL-NACL 10-0.9 MG/250ML-% IV SOLN
INTRAVENOUS | Status: DC | PRN
  Administered 2021-01-26: 40 ug/min via INTRAVENOUS

## 2021-01-26 MED ORDER — LORAZEPAM 2 MG/ML IJ SOLN
1.0000 mg | INTRAMUSCULAR | Status: DC | PRN
  Administered 2021-01-31 – 2021-02-02 (×3): 1 mg via INTRAVENOUS
  Filled 2021-01-26 (×4): qty 1

## 2021-01-26 MED ORDER — HYDROMORPHONE HCL 1 MG/ML IJ SOLN
0.5000 mg | INTRAMUSCULAR | Status: DC | PRN
  Administered 2021-01-30 – 2021-02-01 (×10): 0.5 mg via INTRAVENOUS
  Filled 2021-01-26 (×11): qty 1

## 2021-01-26 MED ORDER — ONDANSETRON HCL 4 MG/2ML IJ SOLN: 4.0000 mg | Freq: Four times a day (QID) | INTRAMUSCULAR | Status: DC | PRN

## 2021-01-26 MED ORDER — QUETIAPINE FUMARATE 100 MG PO TABS
400.0000 mg | ORAL_TABLET | Freq: Two times a day (BID) | ORAL | Status: DC
  Administered 2021-01-26 – 2021-01-28 (×3): 400 mg via ORAL
  Filled 2021-01-26 (×5): qty 4

## 2021-01-26 MED ORDER — PANTOPRAZOLE SODIUM 40 MG IV SOLR: 40.0000 mg | Freq: Every day | INTRAVENOUS | Status: DC

## 2021-01-26 MED ORDER — ROCURONIUM BROMIDE 10 MG/ML (PF) SYRINGE
PREFILLED_SYRINGE | INTRAVENOUS | Status: AC
  Filled 2021-01-26: qty 20

## 2021-01-26 MED ORDER — HYDROMORPHONE HCL 1 MG/ML IJ SOLN
0.5000 mg | INTRAMUSCULAR | Status: DC | PRN
  Administered 2021-01-26 – 2021-01-27 (×3): 1 mg via INTRAVENOUS
  Filled 2021-01-26 (×3): qty 1

## 2021-01-26 MED ORDER — HYDROMORPHONE HCL 1 MG/ML IJ SOLN: 1.0000 mg | INTRAMUSCULAR | Status: DC | PRN

## 2021-01-26 MED ORDER — ENOXAPARIN SODIUM 30 MG/0.3ML IJ SOSY
30.0000 mg | PREFILLED_SYRINGE | Freq: Two times a day (BID) | INTRAMUSCULAR | Status: DC
  Administered 2021-01-27: 30 mg via SUBCUTANEOUS
  Filled 2021-01-26: qty 0.3

## 2021-01-26 MED ORDER — ENOXAPARIN SODIUM 40 MG/0.4ML IJ SOSY: 40.0000 mg | PREFILLED_SYRINGE | INTRAMUSCULAR | Status: DC

## 2021-01-26 MED ORDER — GABAPENTIN 300 MG PO CAPS
300.0000 mg | ORAL_CAPSULE | Freq: Three times a day (TID) | ORAL | Status: DC
  Administered 2021-01-26 – 2021-01-29 (×8): 300 mg via ORAL
  Filled 2021-01-26 (×8): qty 1

## 2021-01-26 MED ORDER — AMITRIPTYLINE HCL 25 MG PO TABS
50.0000 mg | ORAL_TABLET | Freq: Every day | ORAL | Status: DC
  Administered 2021-01-26 – 2021-01-29 (×3): 50 mg via ORAL
  Filled 2021-01-26 (×3): qty 2

## 2021-01-26 MED ORDER — LIDOCAINE 2% (20 MG/ML) 5 ML SYRINGE
INTRAMUSCULAR | Status: AC
  Filled 2021-01-26: qty 10

## 2021-01-26 MED ORDER — MIDAZOLAM HCL 5 MG/5ML IJ SOLN
INTRAMUSCULAR | Status: DC | PRN
  Administered 2021-01-25 – 2021-01-26 (×2): 1 mg via INTRAVENOUS

## 2021-01-26 MED ORDER — ACETAMINOPHEN 325 MG PO TABS
325.0000 mg | ORAL_TABLET | Freq: Four times a day (QID) | ORAL | Status: DC | PRN
Start: 2021-01-27 — End: 2021-01-27

## 2021-01-26 MED ORDER — EPHEDRINE 5 MG/ML INJ
INTRAVENOUS | Status: AC
  Filled 2021-01-26: qty 20

## 2021-01-26 MED ORDER — LEVETIRACETAM 500 MG PO TABS
500.0000 mg | ORAL_TABLET | Freq: Every day | ORAL | Status: DC
  Administered 2021-01-26 – 2021-01-29 (×3): 500 mg via ORAL
  Filled 2021-01-26 (×3): qty 1

## 2021-01-26 MED ORDER — OXYCODONE HCL 5 MG PO TABS
10.0000 mg | ORAL_TABLET | ORAL | Status: DC | PRN
  Administered 2021-01-29: 15 mg via ORAL
  Administered 2021-01-30: 10 mg via ORAL
  Filled 2021-01-26: qty 3

## 2021-01-26 MED ORDER — ONDANSETRON HCL 4 MG/2ML IJ SOLN
INTRAMUSCULAR | Status: DC | PRN
  Administered 2021-01-26: 4 mg via INTRAVENOUS

## 2021-01-26 MED ORDER — ALBUMIN HUMAN 5 % IV SOLN: INTRAVENOUS | Status: DC | PRN

## 2021-01-26 MED ORDER — ONDANSETRON HCL 4 MG/2ML IJ SOLN
INTRAMUSCULAR | Status: AC
  Filled 2021-01-26: qty 4

## 2021-01-26 MED ORDER — PHENYLEPHRINE HCL (PRESSORS) 10 MG/ML IV SOLN
INTRAVENOUS | Status: DC | PRN
  Administered 2021-01-26 (×2): 80 ug via INTRAVENOUS

## 2021-01-26 MED ORDER — EPHEDRINE SULFATE 50 MG/ML IJ SOLN
INTRAMUSCULAR | Status: DC | PRN
  Administered 2021-01-26: 10 mg via INTRAVENOUS

## 2021-01-26 MED ORDER — PHENYLEPHRINE 40 MCG/ML (10ML) SYRINGE FOR IV PUSH (FOR BLOOD PRESSURE SUPPORT)
PREFILLED_SYRINGE | INTRAVENOUS | Status: AC
  Filled 2021-01-26: qty 20

## 2021-01-26 MED ORDER — METHOCARBAMOL 1000 MG/10ML IJ SOLN
500.0000 mg | Freq: Four times a day (QID) | INTRAVENOUS | Status: DC | PRN
  Filled 2021-01-26: qty 5

## 2021-01-26 MED ORDER — PANTOPRAZOLE SODIUM 40 MG PO TBEC
40.0000 mg | DELAYED_RELEASE_TABLET | Freq: Every day | ORAL | Status: DC
  Administered 2021-01-26 – 2021-01-29 (×3): 40 mg via ORAL
  Filled 2021-01-26 (×3): qty 1

## 2021-01-26 MED ORDER — METHOCARBAMOL 1000 MG/10ML IJ SOLN: 1000.0000 mg | Freq: Three times a day (TID) | INTRAVENOUS | Status: DC

## 2021-01-26 MED ORDER — 0.9 % SODIUM CHLORIDE (POUR BTL) OPTIME
TOPICAL | Status: DC | PRN
  Administered 2021-01-26 (×2): 1000 mL

## 2021-01-26 MED ORDER — ALBUTEROL SULFATE (2.5 MG/3ML) 0.083% IN NEBU
2.5000 mg | INHALATION_SOLUTION | Freq: Four times a day (QID) | RESPIRATORY_TRACT | Status: DC | PRN
  Administered 2021-01-27: 2.5 mg via RESPIRATORY_TRACT
  Filled 2021-01-26 (×2): qty 3

## 2021-01-26 MED ORDER — PROPOFOL 10 MG/ML IV BOLUS
INTRAVENOUS | Status: DC | PRN
  Administered 2021-01-26: 140 mg via INTRAVENOUS

## 2021-01-26 MED ORDER — LACTATED RINGERS IV SOLN
INTRAVENOUS | Status: DC
  Administered 2021-01-26: 1000 mL via INTRAVENOUS

## 2021-01-26 MED ORDER — CEFAZOLIN SODIUM-DEXTROSE 2-3 GM-%(50ML) IV SOLR
INTRAVENOUS | Status: DC | PRN
  Administered 2021-01-26: 2 g via INTRAVENOUS

## 2021-01-26 MED ORDER — DEXAMETHASONE SODIUM PHOSPHATE 10 MG/ML IJ SOLN
INTRAMUSCULAR | Status: AC
  Filled 2021-01-26: qty 1

## 2021-01-26 MED ORDER — FENTANYL CITRATE (PF) 100 MCG/2ML IJ SOLN: 25.0000 ug | INTRAMUSCULAR | Status: DC | PRN

## 2021-01-26 MED ORDER — FENTANYL CITRATE (PF) 250 MCG/5ML IJ SOLN
INTRAMUSCULAR | Status: DC | PRN
  Administered 2021-01-26 (×3): 50 ug via INTRAVENOUS
  Administered 2021-01-26: 25 ug via INTRAVENOUS
  Administered 2021-01-26: 50 ug via INTRAVENOUS
  Administered 2021-01-26: 25 ug via INTRAVENOUS

## 2021-01-26 MED ORDER — METHOCARBAMOL 500 MG PO TABS
500.0000 mg | ORAL_TABLET | Freq: Four times a day (QID) | ORAL | Status: DC | PRN
  Administered 2021-01-26: 500 mg via ORAL
  Filled 2021-01-26: qty 1

## 2021-01-26 MED ORDER — ONDANSETRON 4 MG PO TBDP: 4.0000 mg | ORAL_TABLET | Freq: Four times a day (QID) | ORAL | Status: DC | PRN

## 2021-01-26 MED ORDER — SODIUM CHLORIDE 0.9 % IR SOLN
Status: DC | PRN
  Administered 2021-01-26: 9000 mL

## 2021-01-26 MED ORDER — METOCLOPRAMIDE HCL 5 MG/ML IJ SOLN: 5.0000 mg | Freq: Three times a day (TID) | INTRAMUSCULAR | Status: DC | PRN

## 2021-01-26 MED ORDER — SUCCINYLCHOLINE CHLORIDE 20 MG/ML IJ SOLN
INTRAMUSCULAR | Status: DC | PRN
  Administered 2021-01-26: 100 mg via INTRAVENOUS

## 2021-01-26 MED ORDER — ATOMOXETINE HCL 60 MG PO CAPS
60.0000 mg | ORAL_CAPSULE | Freq: Every morning | ORAL | Status: DC
  Administered 2021-01-26 – 2021-02-09 (×11): 60 mg via ORAL
  Filled 2021-01-26 (×17): qty 1

## 2021-01-26 MED ORDER — PANTOPRAZOLE SODIUM 20 MG PO TBEC: 20.0000 mg | DELAYED_RELEASE_TABLET | Freq: Every day | ORAL | Status: DC

## 2021-01-26 MED ORDER — LACTATED RINGERS IV SOLN: INTRAVENOUS | Status: DC | PRN

## 2021-01-26 MED ORDER — LIDOCAINE HCL (CARDIAC) PF 100 MG/5ML IV SOSY
PREFILLED_SYRINGE | INTRAVENOUS | Status: DC | PRN
  Administered 2021-01-26: 60 mg via INTRATRACHEAL

## 2021-01-26 MED ORDER — FLUTICASONE FUROATE-VILANTEROL 100-25 MCG/INH IN AEPB
1.0000 | INHALATION_SPRAY | Freq: Every day | RESPIRATORY_TRACT | Status: DC
  Administered 2021-01-28 – 2021-02-11 (×12): 1 via RESPIRATORY_TRACT
  Filled 2021-01-26 (×2): qty 28
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
## 2021-01-26 NOTE — Progress Notes (Signed)
Orthopedic Tech Progress Note Patient Details:  Molly Dennis [birth year] [id number]  Ortho Devices Type of Ortho Device: Arm sling, Sling immobilizer Ortho Device/Splint Location: rle. I applied a posterior short leg splint with dr's assist to stabilize leg until surgery. Ortho Device/Splint Interventions: Ordered, Application, Adjustment   Post Interventions Patient Tolerated: Well Instructions Provided: Care of device, Adjustment of device  Saul Fordyce 01/26/2021, 3:15 PM

## 2021-01-26 NOTE — Anesthesia Postprocedure Evaluation (Signed)
Anesthesia Post Note  Patient: Molly Dennis  Procedure(s) Performed: IRRIGATION AND DEBRIDEMENT RIGHT TIBIAL/FIBULA,, CALCANEUS (Right)     Patient location during evaluation: PACU Anesthesia Type: General Level of consciousness: awake Pain management: pain level controlled Vital Signs Assessment: post-procedure vital signs reviewed and stable Respiratory status: spontaneous breathing Cardiovascular status: stable Postop Assessment: no apparent nausea or vomiting Anesthetic complications: no   No notable events documented.  Last Vitals:  Vitals:   01/25/21 2245 01/25/21 2301  BP: 99/79 105/69  Pulse: (!) 116 (!) 113  Resp: 18 (!) 25  Temp: (!) 36.1 C   SpO2: 99% 100%    Last Pain:  Vitals:   01/25/21 2301  TempSrc:   PainSc: 10-Worst pain ever                 Denelle Capurro

## 2021-01-26 NOTE — Plan of Care (Signed)
  Problem: Education: Goal: Knowledge of General Education information will improve Description Including pain rating scale, medication(s)/side effects and non-pharmacologic comfort measures Outcome: Progressing   Problem: Health Behavior/Discharge Planning: Goal: Ability to manage health-related needs will improve Outcome: Progressing   

## 2021-01-26 NOTE — Anesthesia Procedure Notes (Signed)
Procedure Name: Intubation Date/Time: 01/26/2021 12:34 AM Performed by: Claudina Lick, CRNA Pre-anesthesia Checklist: Patient identified, Emergency Drugs available, Suction available and Patient being monitored Patient Re-evaluated:Patient Re-evaluated prior to induction Oxygen Delivery Method: Circle system utilized Preoxygenation: Pre-oxygenation with 100% oxygen Induction Type: IV induction, Rapid sequence and Cricoid Pressure applied Laryngoscope Size: Miller and 2 Grade View: Grade I Tube type: Oral Tube size: 7.0 mm Number of attempts: 1 Airway Equipment and Method: Stylet Placement Confirmation: ETT inserted through vocal cords under direct vision, positive ETCO2 and breath sounds checked- equal and bilateral Secured at: 21 cm Tube secured with: Tape Dental Injury: Teeth and Oropharynx as per pre-operative assessment
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
## 2021-01-26 NOTE — Progress Notes (Signed)
RN unable to contain surgical consent at this time.  Patient alert to self only, and mother's name is listed in emergency contact but has no phone number listed for her.

## 2021-01-26 NOTE — Progress Notes (Signed)
Patient ID: Molly Dennis, female   DOB: Mar 20, 1971, 50 y.o.   MRN: [id number] Subjective: 1 Day Post-Op Procedure(s) (LRB): IRRIGATION AND DEBRIDEMENT RIGHT TIBIAL/FIBULA, CALCANEUS (Right)    Patient is very sleepy this am.  Arousable but falls back to sleep   Objective:   VITALS:   Vitals:   01/26/21 0408 01/26/21 0707  BP: 108/82 108/80  Pulse: 95 97  Resp: 16 20  Temp: (!) 97.5 F (36.4 C) (!) 97.4 F (36.3 C)  SpO2: 100% 100%    Incision: dressing C/D/I  LABS Recent Labs    01/25/21 2039 01/25/21 2041 01/26/21 0155 01/26/21 0513  HGB 12.0 13.3 9.9* 9.1*  HCT 36.4 39.0 29.0* 27.2*  WBC 24.6*  --   --  14.5*  PLT 532*  --   --  269    Recent Labs    01/25/21 2041 01/26/21 0155 01/26/21 0408 01/26/21 0513  NA 134* 137  --  135  K 4.8 5.3*  --  4.1  BUN 13 17  --  12  CREATININE 1.10* 1.20* 1.32* 1.24*  GLUCOSE 119* 133*  --  136*    Recent Labs    01/25/21 2039  INR 1.0     Assessment/Plan: 1 Day Post-Op Procedure(s) (LRB): IRRIGATION AND DEBRIDEMENT RIGHT TIBIAL/FIBULA,, CALCANEUS (Right)   Plan: Stable at this point Orthopaedically Dr Linna Caprice had discussed case with Dr. Jena Gauss Plan is to definitely fix RLE injuries tomorrow or early this week NPO after midnight Regular diet today

## 2021-01-26 NOTE — Transfer of Care (Signed)
Immediate Anesthesia Transfer of Care Note  Patient: Molly Dennis  Procedure(s) Performed: IRRIGATION AND DEBRIDEMENT RIGHT TIBIAL/FIBULA,, CALCANEUS (Right)  Patient Location: PACU  Anesthesia Type:General  Level of Consciousness: drowsy  Airway & Oxygen Therapy: Patient Spontanous Breathing and Patient connected to face mask oxygen  Post-op Assessment: Report given to RN and Post -op Vital signs reviewed and stable  Post vital signs: Reviewed and stable  Last Vitals:  Vitals Value Taken Time  BP 95/76 01/26/21 0238  Temp    Pulse 101 01/26/21 0239  Resp 18 01/26/21 0239  SpO2 98 % 01/26/21 0239  Vitals shown include unvalidated device data.  Last Pain:  Vitals:   01/25/21 2301  TempSrc:   PainSc: 10-Worst pain ever         Complications: No notable events documented.

## 2021-01-26 NOTE — TOC CAGE-AID Note (Signed)
Transition of Care [facility]) - CAGE-AID Screening   Patient Details  Name: Molly Dennis MRN: [id number] Date of Birth: 06/05/1971  Clinical Narrative:  Patient endorses alcohol and drug use. States people do criticize her of use but does not feel she has a problem or needs any resources at this time.  CAGE-AID Screening:    Have You Ever Felt You Ought to Cut Down on Your Drinking or Drug Use?: No Have People Annoyed You By Critizing Your Drinking Or Drug Use?: Yes Have You Felt Bad Or Guilty About Your Drinking Or Drug Use?: No Have You Ever Had a Drink or Used Drugs First Thing In The Morning to Steady Your Nerves or to Get Rid of a Hangover?: No CAGE-AID Score: 1  Substance Abuse Education Offered: Yes
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
## 2021-01-26 NOTE — ED Notes (Signed)
Family updated as to patient's status. Sister Antonieta Loveless.  Mother Larene Pickett.  Mother is HOH.  Phone # [phone].
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
## 2021-01-27 ENCOUNTER — Encounter (HOSPITAL_COMMUNITY): Admission: EM | Disposition: A | Discharge status: Skilled Nursing Facility | Payer: Self-pay | Source: Home / Self Care

## 2021-01-27 ENCOUNTER — Inpatient Hospital Stay (HOSPITAL_COMMUNITY): Payer: Medicare Other | Admitting: Anesthesiology

## 2021-01-27 ENCOUNTER — Inpatient Hospital Stay (HOSPITAL_COMMUNITY): Payer: Medicare Other

## 2021-01-27 ENCOUNTER — Encounter (HOSPITAL_COMMUNITY): Payer: Self-pay | Admitting: General Surgery

## 2021-01-27 HISTORY — PX: TIBIA IM NAIL INSERTION: SHX2516

## 2021-01-27 HISTORY — PX: ORIF CALCANEOUS FRACTURE: SHX5030

## 2021-01-27 HISTORY — PX: ORIF TIBIA PLATEAU: SHX2132

## 2021-01-27 LAB — SURGICAL PCR SCREEN
MRSA, PCR: POSITIVE — AB
Staphylococcus aureus: POSITIVE — AB

## 2021-01-27 SURGERY — OPEN REDUCTION INTERNAL FIXATION (ORIF) TIBIAL PLATEAU
Anesthesia: General | Laterality: Right

## 2021-01-27 MED ORDER — SUCCINYLCHOLINE CHLORIDE 200 MG/10ML IV SOSY
PREFILLED_SYRINGE | INTRAVENOUS | Status: DC | PRN
  Administered 2021-01-27: 100 mg via INTRAVENOUS

## 2021-01-27 MED ORDER — METHOCARBAMOL 1000 MG/10ML IJ SOLN: 500.0000 mg | Freq: Four times a day (QID) | INTRAVENOUS | Status: DC | PRN

## 2021-01-27 MED ORDER — 0.9 % SODIUM CHLORIDE (POUR BTL) OPTIME
TOPICAL | Status: DC | PRN
  Administered 2021-01-27: 1000 mL

## 2021-01-27 MED ORDER — ACETAMINOPHEN 10 MG/ML IV SOLN
INTRAVENOUS | Status: DC | PRN
  Administered 2021-01-27: 1000 mg via INTRAVENOUS

## 2021-01-27 MED ORDER — TOBRAMYCIN SULFATE 1.2 G IJ SOLR
INTRAMUSCULAR | Status: AC
  Filled 2021-01-27: qty 1.2

## 2021-01-27 MED ORDER — PROMETHAZINE HCL 25 MG/ML IJ SOLN: 6.2500 mg | INTRAMUSCULAR | Status: DC | PRN

## 2021-01-27 MED ORDER — CHLORHEXIDINE GLUCONATE CLOTH 2 % EX PADS
6.0000 | MEDICATED_PAD | Freq: Every day | CUTANEOUS | Status: AC
  Administered 2021-01-27 – 2021-01-31 (×5): 6 via TOPICAL

## 2021-01-27 MED ORDER — HYDROMORPHONE HCL 1 MG/ML IJ SOLN
0.2500 mg | INTRAMUSCULAR | Status: DC | PRN
  Administered 2021-01-27: 0.5 mg via INTRAVENOUS

## 2021-01-27 MED ORDER — LACTATED RINGERS IV SOLN: INTRAVENOUS | Status: DC | PRN

## 2021-01-27 MED ORDER — SODIUM CHLORIDE 0.9 % IR SOLN
Status: DC | PRN
  Administered 2021-01-27 (×3): 3000 mL

## 2021-01-27 MED ORDER — KETOROLAC TROMETHAMINE 30 MG/ML IJ SOLN
INTRAMUSCULAR | Status: AC
  Filled 2021-01-27: qty 1

## 2021-01-27 MED ORDER — LIDOCAINE 2% (20 MG/ML) 5 ML SYRINGE
INTRAMUSCULAR | Status: DC | PRN
  Administered 2021-01-27: 80 mg via INTRAVENOUS

## 2021-01-27 MED ORDER — KETOROLAC TROMETHAMINE 30 MG/ML IJ SOLN
30.0000 mg | Freq: Once | INTRAMUSCULAR | Status: AC | PRN
  Administered 2021-01-27: 30 mg via INTRAVENOUS

## 2021-01-27 MED ORDER — CHLORHEXIDINE GLUCONATE 0.12 % MT SOLN
OROMUCOSAL | Status: AC
  Filled 2021-01-27: qty 15

## 2021-01-27 MED ORDER — DOCUSATE SODIUM 100 MG PO CAPS
100.0000 mg | ORAL_CAPSULE | Freq: Two times a day (BID) | ORAL | Status: DC
  Administered 2021-01-28 – 2021-01-29 (×5): 100 mg via ORAL
  Filled 2021-01-27 (×5): qty 1

## 2021-01-27 MED ORDER — METHOCARBAMOL 500 MG PO TABS
500.0000 mg | ORAL_TABLET | Freq: Four times a day (QID) | ORAL | Status: DC | PRN
  Filled 2021-01-27: qty 1

## 2021-01-27 MED ORDER — ALBUTEROL SULFATE HFA 108 (90 BASE) MCG/ACT IN AERS
INHALATION_SPRAY | RESPIRATORY_TRACT | Status: DC | PRN
  Administered 2021-01-27: 4 via RESPIRATORY_TRACT

## 2021-01-27 MED ORDER — MUPIROCIN 2 % EX OINT
1.0000 "application " | TOPICAL_OINTMENT | Freq: Two times a day (BID) | CUTANEOUS | Status: AC
  Administered 2021-01-27 – 2021-01-31 (×9): 1 via NASAL
  Filled 2021-01-27 (×3): qty 22

## 2021-01-27 MED ORDER — PHENYLEPHRINE HCL-NACL 10-0.9 MG/250ML-% IV SOLN
INTRAVENOUS | Status: DC | PRN
  Administered 2021-01-27: 25 ug/min via INTRAVENOUS

## 2021-01-27 MED ORDER — DEXAMETHASONE SODIUM PHOSPHATE 10 MG/ML IJ SOLN
INTRAMUSCULAR | Status: DC | PRN
  Administered 2021-01-27: 10 mg via INTRAVENOUS

## 2021-01-27 MED ORDER — ONDANSETRON HCL 4 MG/2ML IJ SOLN
INTRAMUSCULAR | Status: DC | PRN
  Administered 2021-01-27: 4 mg via INTRAVENOUS

## 2021-01-27 MED ORDER — ALBUMIN HUMAN 5 % IV SOLN: INTRAVENOUS | Status: DC | PRN

## 2021-01-27 MED ORDER — IPRATROPIUM-ALBUTEROL 0.5-2.5 (3) MG/3ML IN SOLN
3.0000 mL | RESPIRATORY_TRACT | Status: DC
  Administered 2021-01-27 – 2021-01-29 (×12): 3 mL via RESPIRATORY_TRACT
  Filled 2021-01-27 (×12): qty 3

## 2021-01-27 MED ORDER — PHENYLEPHRINE 40 MCG/ML (10ML) SYRINGE FOR IV PUSH (FOR BLOOD PRESSURE SUPPORT)
PREFILLED_SYRINGE | INTRAVENOUS | Status: DC | PRN
  Administered 2021-01-27: 200 ug via INTRAVENOUS
  Administered 2021-01-27 (×3): 120 ug via INTRAVENOUS

## 2021-01-27 MED ORDER — POLYETHYLENE GLYCOL 3350 17 G PO PACK: 17.0000 g | PACK | Freq: Every day | ORAL | Status: DC | PRN

## 2021-01-27 MED ORDER — PROPOFOL 10 MG/ML IV BOLUS
INTRAVENOUS | Status: DC | PRN
  Administered 2021-01-27: 100 mg via INTRAVENOUS

## 2021-01-27 MED ORDER — SODIUM CHLORIDE 0.9 % IV SOLN
2.0000 g | INTRAVENOUS | Status: AC
  Administered 2021-01-28 – 2021-01-30 (×3): 2 g via INTRAVENOUS
  Filled 2021-01-27 (×3): qty 20

## 2021-01-27 MED ORDER — ARTIFICIAL TEARS OPHTHALMIC OINT
TOPICAL_OINTMENT | OPHTHALMIC | Status: DC | PRN
  Administered 2021-01-27: 1 via OPHTHALMIC

## 2021-01-27 MED ORDER — HYDROMORPHONE HCL 1 MG/ML IJ SOLN
INTRAMUSCULAR | Status: AC
  Filled 2021-01-27: qty 1

## 2021-01-27 MED ORDER — FENTANYL CITRATE (PF) 250 MCG/5ML IJ SOLN
INTRAMUSCULAR | Status: DC | PRN
  Administered 2021-01-27 (×4): 50 ug via INTRAVENOUS

## 2021-01-27 MED ORDER — CEFAZOLIN SODIUM-DEXTROSE 2-4 GM/100ML-% IV SOLN: 2.0000 g | INTRAVENOUS | Status: DC

## 2021-01-27 MED ORDER — ONDANSETRON HCL 4 MG PO TABS
4.0000 mg | ORAL_TABLET | Freq: Four times a day (QID) | ORAL | Status: DC | PRN
  Administered 2021-02-06: 4 mg via ORAL
  Filled 2021-01-27: qty 1

## 2021-01-27 MED ORDER — FENTANYL CITRATE (PF) 250 MCG/5ML IJ SOLN
INTRAMUSCULAR | Status: AC
  Filled 2021-01-27: qty 5

## 2021-01-27 MED ORDER — ROCURONIUM BROMIDE 10 MG/ML (PF) SYRINGE
PREFILLED_SYRINGE | INTRAVENOUS | Status: DC | PRN
  Administered 2021-01-27 (×2): 20 mg via INTRAVENOUS
  Administered 2021-01-27: 50 mg via INTRAVENOUS

## 2021-01-27 MED ORDER — ALBUTEROL SULFATE HFA 108 (90 BASE) MCG/ACT IN AERS
INHALATION_SPRAY | RESPIRATORY_TRACT | Status: AC
  Filled 2021-01-27: qty 6.7

## 2021-01-27 MED ORDER — ONDANSETRON HCL 4 MG/2ML IJ SOLN: 4.0000 mg | Freq: Four times a day (QID) | INTRAMUSCULAR | Status: DC | PRN

## 2021-01-27 MED ORDER — GUAIFENESIN 100 MG/5ML PO SOLN
10.0000 mL | ORAL | Status: DC
  Administered 2021-01-27 – 2021-01-29 (×15): 200 mg via ORAL
  Filled 2021-01-27 (×15): qty 10

## 2021-01-27 MED ORDER — SODIUM CHLORIDE (PF) 0.9 % IJ SOLN
INTRAMUSCULAR | Status: AC
  Filled 2021-01-27: qty 10

## 2021-01-27 MED ORDER — CEFAZOLIN SODIUM-DEXTROSE 2-3 GM-%(50ML) IV SOLR
INTRAVENOUS | Status: DC | PRN
  Administered 2021-01-27: 2 g via INTRAVENOUS

## 2021-01-27 MED ORDER — METOCLOPRAMIDE HCL 5 MG/ML IJ SOLN: 5.0000 mg | Freq: Three times a day (TID) | INTRAMUSCULAR | Status: DC | PRN

## 2021-01-27 MED ORDER — ACETAMINOPHEN 10 MG/ML IV SOLN
INTRAVENOUS | Status: AC
  Filled 2021-01-27: qty 100

## 2021-01-27 MED ORDER — VANCOMYCIN HCL 1000 MG IV SOLR
INTRAVENOUS | Status: AC
  Filled 2021-01-27: qty 1000

## 2021-01-27 MED ORDER — CHLORHEXIDINE GLUCONATE 0.12 % MT SOLN
OROMUCOSAL | Status: AC
  Administered 2021-01-27: 15 mL via OROMUCOSAL
  Filled 2021-01-27: qty 15

## 2021-01-27 MED ORDER — SUGAMMADEX SODIUM 200 MG/2ML IV SOLN
INTRAVENOUS | Status: DC | PRN
  Administered 2021-01-27: 200 mg via INTRAVENOUS

## 2021-01-27 MED ORDER — PROPOFOL 10 MG/ML IV BOLUS
INTRAVENOUS | Status: AC
  Filled 2021-01-27: qty 20

## 2021-01-27 MED ORDER — CHLORHEXIDINE GLUCONATE 4 % EX LIQD: 60.0000 mL | Freq: Once | CUTANEOUS | Status: DC

## 2021-01-27 MED ORDER — ACETAMINOPHEN 500 MG PO TABS
1000.0000 mg | ORAL_TABLET | Freq: Four times a day (QID) | ORAL | Status: DC
  Administered 2021-01-27 – 2021-01-30 (×10): 1000 mg via ORAL
  Filled 2021-01-27 (×10): qty 2

## 2021-01-27 MED ORDER — TOBRAMYCIN SULFATE 1.2 G IJ SOLR
INTRAMUSCULAR | Status: DC | PRN
  Administered 2021-01-27: 1.2 g via TOPICAL

## 2021-01-27 MED ORDER — ORAL CARE MOUTH RINSE: 15.0000 mL | Freq: Once | OROMUCOSAL | Status: AC

## 2021-01-27 MED ORDER — POVIDONE-IODINE 10 % EX SWAB
2.0000 "application " | Freq: Once | CUTANEOUS | Status: AC
  Administered 2021-01-27: 2 via TOPICAL

## 2021-01-27 MED ORDER — METHOCARBAMOL 500 MG PO TABS
1000.0000 mg | ORAL_TABLET | Freq: Three times a day (TID) | ORAL | Status: DC
  Administered 2021-01-27 – 2021-01-30 (×8): 1000 mg via ORAL
  Filled 2021-01-27 (×8): qty 2

## 2021-01-27 MED ORDER — MIDAZOLAM HCL 2 MG/2ML IJ SOLN
INTRAMUSCULAR | Status: AC
  Filled 2021-01-27: qty 2

## 2021-01-27 MED ORDER — LACTATED RINGERS IV SOLN: INTRAVENOUS | Status: DC

## 2021-01-27 MED ORDER — CHLORHEXIDINE GLUCONATE 0.12 % MT SOLN: 15.0000 mL | Freq: Once | OROMUCOSAL | Status: AC

## 2021-01-27 MED ORDER — METOCLOPRAMIDE HCL 5 MG PO TABS
5.0000 mg | ORAL_TABLET | Freq: Three times a day (TID) | ORAL | Status: DC | PRN
  Filled 2021-01-27: qty 2

## 2021-01-27 MED ORDER — CEFAZOLIN SODIUM-DEXTROSE 2-4 GM/100ML-% IV SOLN
INTRAVENOUS | Status: AC
  Filled 2021-01-27: qty 100

## 2021-01-27 MED ORDER — VANCOMYCIN HCL 1000 MG IV SOLR
INTRAVENOUS | Status: DC | PRN
  Administered 2021-01-27: 1000 mg via TOPICAL

## 2021-01-27 SURGICAL SUPPLY — 112 items
BANDAGE ESMARK 6X9 LF (GAUZE/BANDAGES/DRESSINGS) IMPLANT
BIT DRILL CALIB QC 170X80 (BIT) ×2 IMPLANT
BIT DRILL CANN QC 5X300 (BIT) ×2 IMPLANT
BIT DRILL QC SFS 2.5X170 (BIT) ×2 IMPLANT
BIT DRILL SHORT 4.2 (BIT) ×2 IMPLANT
BLADE CLIPPER SURG (BLADE) IMPLANT
BLADE SURG 10 STRL SS (BLADE) ×4 IMPLANT
BLADE SURG 15 STRL LF DISP TIS (BLADE) ×1 IMPLANT
BLADE SURG 15 STRL SS (BLADE) ×1
BNDG COHESIVE 4X5 TAN STRL (GAUZE/BANDAGES/DRESSINGS) ×2 IMPLANT
BNDG ELASTIC 4X5.8 VLCR STR LF (GAUZE/BANDAGES/DRESSINGS) ×2 IMPLANT
BNDG ELASTIC 6X5.8 VLCR STR LF (GAUZE/BANDAGES/DRESSINGS) ×2 IMPLANT
BNDG ESMARK 6X9 LF (GAUZE/BANDAGES/DRESSINGS)
BNDG GAUZE ELAST 4 BULKY (GAUZE/BANDAGES/DRESSINGS) ×4 IMPLANT
BRUSH SCRUB EZ PLAIN DRY (MISCELLANEOUS) ×4 IMPLANT
CANISTER SUCT 3000ML PPV (MISCELLANEOUS) IMPLANT
CANISTER WOUNDNEG PRESSURE 500 (CANNISTER) ×2 IMPLANT
CHLORAPREP W/TINT 26 (MISCELLANEOUS) ×4 IMPLANT
CONNECTOR 5 IN 1 STRAIGHT STRL (MISCELLANEOUS) ×2 IMPLANT
CONNECTOR Y ATS VAC SYSTEM (MISCELLANEOUS) ×2 IMPLANT
COVER MAYO STAND STRL (DRAPES) ×2 IMPLANT
COVER SURGICAL LIGHT HANDLE (MISCELLANEOUS) ×4 IMPLANT
COVER WAND RF STERILE (DRAPES) ×2 IMPLANT
CUFF TOURN SGL QUICK 34 (TOURNIQUET CUFF) ×1
CUFF TRNQT CYL 34X4.125X (TOURNIQUET CUFF) ×1 IMPLANT
DRAPE C-ARM 42X72 X-RAY (DRAPES) ×2 IMPLANT
DRAPE C-ARMOR (DRAPES) ×2 IMPLANT
DRAPE HALF SHEET 40X57 (DRAPES) ×4 IMPLANT
DRAPE IMP U-DRAPE 54X76 (DRAPES) ×4 IMPLANT
DRAPE INCISE IOBAN 66X45 STRL (DRAPES) ×2 IMPLANT
DRAPE ORTHO SPLIT 77X108 STRL (DRAPES) ×2
DRAPE SURG ORHT 6 SPLT 77X108 (DRAPES) ×2 IMPLANT
DRAPE U-SHAPE 47X51 STRL (DRAPES) ×2 IMPLANT
DRILL BIT SHORT 4.2 (BIT) ×2
DRSG ADAPTIC 3X8 NADH LF (GAUZE/BANDAGES/DRESSINGS) IMPLANT
DRSG EMULSION OIL 3X3 NADH (GAUZE/BANDAGES/DRESSINGS) ×2 IMPLANT
DRSG MEPITEL 4X7.2 (GAUZE/BANDAGES/DRESSINGS) ×6 IMPLANT
DRSG PAD ABDOMINAL 8X10 ST (GAUZE/BANDAGES/DRESSINGS) ×4 IMPLANT
DRSG VERAFLO VAC MED (GAUZE/BANDAGES/DRESSINGS) ×2 IMPLANT
ELECT REM PT RETURN 9FT ADLT (ELECTROSURGICAL) ×2
ELECTRODE REM PT RTRN 9FT ADLT (ELECTROSURGICAL) ×1 IMPLANT
GAUZE SPONGE 4X4 12PLY STRL (GAUZE/BANDAGES/DRESSINGS) ×4 IMPLANT
GLOVE SURG ENC MOIS LTX SZ6.5 (GLOVE) ×6 IMPLANT
GLOVE SURG ENC MOIS LTX SZ7.5 (GLOVE) ×8 IMPLANT
GLOVE SURG UNDER POLY LF SZ6.5 (GLOVE) ×2 IMPLANT
GLOVE SURG UNDER POLY LF SZ7.5 (GLOVE) ×2 IMPLANT
GOWN STRL REUS W/ TWL LRG LVL3 (GOWN DISPOSABLE) ×2 IMPLANT
GOWN STRL REUS W/TWL LRG LVL3 (GOWN DISPOSABLE) ×2
GUIDEWIRE THREADED 2.8 (WIRE) ×4 IMPLANT
IMMOBILIZER KNEE 22 UNIV (SOFTGOODS) IMPLANT
KIT BASIN OR (CUSTOM PROCEDURE TRAY) ×2 IMPLANT
KIT TURNOVER KIT B (KITS) ×2 IMPLANT
MANIFOLD NEPTUNE II (INSTRUMENTS) ×2 IMPLANT
NAIL TIB TFNA 9X345 (Nail) ×2 IMPLANT
NDL SUT 6 .5 CRC .975X.05 MAYO (NEEDLE) ×2 IMPLANT
NEEDLE HYPO 21X1.5 SAFETY (NEEDLE) IMPLANT
NEEDLE MAYO TAPER (NEEDLE) ×2
NS IRRIG 1000ML POUR BTL (IV SOLUTION) ×2 IMPLANT
PACK ORTHO EXTREMITY (CUSTOM PROCEDURE TRAY) ×2 IMPLANT
PACK TOTAL JOINT (CUSTOM PROCEDURE TRAY) ×2 IMPLANT
PAD ARMBOARD 7.5X6 YLW CONV (MISCELLANEOUS) ×4 IMPLANT
PAD CAST 4YDX4 CTTN HI CHSV (CAST SUPPLIES) ×2 IMPLANT
PADDING CAST COTTON 4X4 STRL (CAST SUPPLIES) ×2
PADDING CAST COTTON 6X4 STRL (CAST SUPPLIES) ×4 IMPLANT
PLATE TIBIA VA-LCP 6H RT (Plate) ×2 IMPLANT
REAMER ROD DEEP FLUTE 2.5X950 (INSTRUMENTS) ×2 IMPLANT
SCREW CANN 32MM 6.5X55MM (Screw) ×2 IMPLANT
SCREW CANN FT 6.5X70 FT (Screw) ×2 IMPLANT
SCREW CORTEX 3.5 28MM (Screw) ×1 IMPLANT
SCREW CORTEX 3.5 30MM (Screw) ×1 IMPLANT
SCREW CORTEX 3.5 36MM (Screw) ×1 IMPLANT
SCREW CORTEX 3.5 38MM (Screw) ×1 IMPLANT
SCREW CORTEX 3.5X80MM (Screw) ×2 IMPLANT
SCREW LCK SLF-TPNG STRDRV 12 (Screw) ×2 IMPLANT
SCREW LOCK CORT ST 3.5X28 (Screw) ×1 IMPLANT
SCREW LOCK CORT ST 3.5X30 (Screw) ×1 IMPLANT
SCREW LOCK CORT ST 3.5X36 (Screw) ×1 IMPLANT
SCREW LOCK CORT ST 3.5X38 (Screw) ×1 IMPLANT
SCREW LOCK IM NAIL 5X66 (Screw) ×2 IMPLANT
SCREW LOCK IM TI 5X32 (Screw) ×4 IMPLANT
SCREW LOCK IM TI 5X40 (Screw) ×2 IMPLANT
SCREW LOCKING 3.5X80MM VA (Screw) ×2 IMPLANT
SCREW LOCKING VA 3.5X75MM (Screw) ×6 IMPLANT
SCREW SHANZ 4.0X60MM (EXFIX) IMPLANT
SCREW VA-LCP THRD ST 3.5X10 (Screw) ×2 IMPLANT
SPONGE LAP 18X18 RF (DISPOSABLE) IMPLANT
STAPLER VISISTAT 35W (STAPLE) ×2 IMPLANT
SUCTION FRAZIER HANDLE 10FR (MISCELLANEOUS) ×1
SUCTION TUBE FRAZIER 10FR DISP (MISCELLANEOUS) ×1 IMPLANT
SUT ETHILON 2 0 FS 18 (SUTURE) ×6 IMPLANT
SUT ETHILON 3 0 PS 1 (SUTURE) ×26 IMPLANT
SUT FIBERWIRE #2 38 T-5 BLUE (SUTURE)
SUT MNCRL AB 3-0 PS2 18 (SUTURE) ×2 IMPLANT
SUT MON AB 2-0 CT1 36 (SUTURE) ×8 IMPLANT
SUT VIC AB 0 CT1 27 (SUTURE) ×1
SUT VIC AB 0 CT1 27XBRD ANBCTR (SUTURE) ×1 IMPLANT
SUT VIC AB 1 CT1 18XCR BRD 8 (SUTURE) IMPLANT
SUT VIC AB 1 CT1 27 (SUTURE) ×1
SUT VIC AB 1 CT1 27XBRD ANBCTR (SUTURE) ×1 IMPLANT
SUT VIC AB 1 CT1 8-18 (SUTURE)
SUT VIC AB 2-0 CT1 18 (SUTURE) ×2 IMPLANT
SUT VIC AB 2-0 CT1 27 (SUTURE) ×2
SUT VIC AB 2-0 CT1 TAPERPNT 27 (SUTURE) ×2 IMPLANT
SUTURE FIBERWR #2 38 T-5 BLUE (SUTURE) IMPLANT
SYR CONTROL 10ML LL (SYRINGE) IMPLANT
TOWEL GREEN STERILE (TOWEL DISPOSABLE) ×4 IMPLANT
TOWEL GREEN STERILE FF (TOWEL DISPOSABLE) ×2 IMPLANT
TRAY FOLEY MTR SLVR 16FR STAT (SET/KITS/TRAYS/PACK) ×2 IMPLANT
TUBE CONNECTING 12X1/4 (SUCTIONS) ×4 IMPLANT
UNDERPAD 30X36 HEAVY ABSORB (UNDERPADS AND DIAPERS) ×2 IMPLANT
WATER STERILE IRR 1000ML POUR (IV SOLUTION) ×4 IMPLANT
YANKAUER SUCT BULB TIP NO VENT (SUCTIONS) IMPLANT
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
## 2021-01-27 NOTE — Progress Notes (Signed)
Telephone consent obtained from pt's sister, Mickel Baas. Brandi voices concern over pt's history of alcoholism and prescription drug use.  Sister is concerned for pt's narcotic use post op

## 2021-01-27 NOTE — Progress Notes (Signed)
Received report from [place], RN in PACU
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
## 2021-01-27 NOTE — Transfer of Care (Signed)
Immediate Anesthesia Transfer of Care Note  Patient: Molly Dennis  Procedure(s) Performed: OPEN REDUCTION INTERNAL FIXATION (ORIF) TIBIAL PLATEAU (Right) INTRAMEDULLARY (IM) NAIL TIBIAL (Right) OPEN REDUCTION INTERNAL FIXATION (ORIF) CALCANEOUS FRACTURE (Right)  Patient Location: PACU  Anesthesia Type:General  Level of Consciousness: drowsy, patient cooperative and responds to stimulation  Airway & Oxygen Therapy: Patient Spontanous Breathing and Patient connected to face mask oxygen  Post-op Assessment: Report given to RN and Post -op Vital signs reviewed and stable  Post vital signs: Reviewed and stable  Last Vitals:  Vitals Value Taken Time  BP 116/56 01/27/21 2116  Temp    Pulse 99 01/27/21 2118  Resp 14 01/27/21 2118  SpO2 99 % 01/27/21 2118  Vitals shown include unvalidated device data.  Last Pain:  Vitals:   01/27/21 1708  TempSrc: Oral  PainSc: 6       Patients Stated Pain Goal: 4 (01/27/21 1708)  Complications: No notable events documented.
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
## 2021-01-27 NOTE — Anesthesia Procedure Notes (Signed)
Procedure Name: Intubation Date/Time: 01/27/2021 5:46 PM Performed by: Zollie Scale, CRNA Pre-anesthesia Checklist: Patient identified, Emergency Drugs available, Suction available and Patient being monitored Patient Re-evaluated:Patient Re-evaluated prior to induction Oxygen Delivery Method: Circle System Utilized Preoxygenation: Pre-oxygenation with 100% oxygen Induction Type: IV induction, Rapid sequence and Cricoid Pressure applied Laryngoscope Size: Miller and 2 Grade View: Grade I Tube type: Oral Tube size: 7.0 mm Number of attempts: 1 Airway Equipment and Method: Stylet and Oral airway Placement Confirmation: ETT inserted through vocal cords under direct vision, positive ETCO2 and breath sounds checked- equal and bilateral Secured at: 21 cm Tube secured with: Tape Dental Injury: Teeth and Oropharynx as per pre-operative assessment

## 2021-01-28 ENCOUNTER — Encounter (HOSPITAL_COMMUNITY): Payer: Self-pay | Admitting: Student

## 2021-01-28 ENCOUNTER — Inpatient Hospital Stay (HOSPITAL_COMMUNITY): Payer: Medicare Other

## 2021-01-28 LAB — CBC
HCT: 13.2 % — ABNORMAL LOW (ref 36.0–46.0)
HCT: 13.8 % — ABNORMAL LOW (ref 36.0–46.0)
Hemoglobin: 4.3 g/dL — CL (ref 12.0–15.0)
Hemoglobin: 4.4 g/dL — CL (ref 12.0–15.0)
MCH: 30.3 pg (ref 26.0–34.0)
MCH: 29.9 pg (ref 26.0–34.0)
MCHC: 32.6 g/dL (ref 30.0–36.0)
MCHC: 31.9 g/dL (ref 30.0–36.0)
MCV: 93 fL (ref 80.0–100.0)
MCV: 93.9 fL (ref 80.0–100.0)
Platelets: 132 10*3/uL — ABNORMAL LOW (ref 150–400)
Platelets: 140 10*3/uL — ABNORMAL LOW (ref 150–400)
RBC: 1.42 MIL/uL — ABNORMAL LOW (ref 3.87–5.11)
RBC: 1.47 MIL/uL — ABNORMAL LOW (ref 3.87–5.11)
RDW: 14.5 % (ref 11.5–15.5)
RDW: 14.3 % (ref 11.5–15.5)
WBC: 10.8 10*3/uL — ABNORMAL HIGH (ref 4.0–10.5)
WBC: 12.1 10*3/uL — ABNORMAL HIGH (ref 4.0–10.5)
nRBC: 0.2 % (ref 0.0–0.2)
nRBC: 0 % (ref 0.0–0.2)

## 2021-01-28 LAB — VITAMIN D 25 HYDROXY (VIT D DEFICIENCY, FRACTURES): Vit D, 25-Hydroxy: 25.29 ng/mL — ABNORMAL LOW (ref 30–100)

## 2021-01-28 LAB — BASIC METABOLIC PANEL
Anion gap: 12 (ref 5–15)
BUN: 13 mg/dL (ref 6–20)
CO2: 23 mmol/L (ref 22–32)
Calcium: 7.8 mg/dL — ABNORMAL LOW (ref 8.9–10.3)
Chloride: 102 mmol/L (ref 98–111)
Creatinine, Ser: 0.82 mg/dL (ref 0.44–1.00)
GFR, Estimated: >60 mL/min (ref >60)
Glucose, Bld: 145 mg/dL — ABNORMAL HIGH (ref 70–99)
Potassium: 3.8 mmol/L (ref 3.5–5.1)
Sodium: 137 mmol/L (ref 135–145)

## 2021-01-28 LAB — HEMOGLOBIN AND HEMATOCRIT, BLOOD
HCT: 23.6 % — ABNORMAL LOW (ref 36.0–46.0)
Hemoglobin: 7.7 g/dL — ABNORMAL LOW (ref 12.0–15.0)

## 2021-01-28 LAB — PREPARE RBC (CROSSMATCH)

## 2021-01-28 MED ORDER — SODIUM CHLORIDE 0.9% IV SOLUTION: Freq: Once | INTRAVENOUS | Status: AC

## 2021-01-28 MED ORDER — IOHEXOL 350 MG/ML SOLN
100.0000 mL | Freq: Once | INTRAVENOUS | Status: AC | PRN
  Administered 2021-01-28: 100 mL via INTRAVENOUS

## 2021-01-28 MED ORDER — SODIUM CHLORIDE 0.9% IV SOLUTION: Freq: Once | INTRAVENOUS | Status: DC

## 2021-01-28 NOTE — Progress Notes (Signed)
RT NOTES: Pt unable to follow directions to effectively do flutter valve. Will continue working with patient as tolerated.

## 2021-01-28 NOTE — Plan of Care (Signed)

## 2021-01-28 NOTE — Progress Notes (Signed)
Transfused 2u pRBC and CT images reviewed in person with the radiologist. No source of significant blood loss on imaging. TEG pending. Recheck CBC after 2u pRBC. Hemodynamics remain normal. Continue to monitor.   Diamantina Monks, MD General and Trauma Surgery [facility]

## 2021-01-28 NOTE — Progress Notes (Signed)
PT Cancellation Note  Patient Details Name: Molly Dennis MRN: [id number] DOB: Jun 01, 1971   Cancelled Treatment:    Reason Eval/Treat Not Completed: Medical issues which prohibited therapy (Hgb 4.4; pt awaiting further imaging).  Lillia Pauls, PT, DPT Acute Rehabilitation Services Pager [phone] Office [phone]    Norval Morton 01/28/2021, 10:12 AM

## 2021-01-28 NOTE — Progress Notes (Signed)
Blood given via [place] by TRN  1015 1 PRBC given [id number] 22 [id number] 1035 stopped.  1043 1 PRBC given W2399 22 [id number] 1052 stopped.  Pt's vitals @ 1052: HR 83, O2 94 on 2L, BP 105/74, Resp 9 Temp 96.7   Orders to Redraw Hgb and wait on other 2 units.

## 2021-01-28 NOTE — Progress Notes (Signed)
OT Cancellation Note  Patient Details Name: Molly Dennis MRN: [id number] DOB: June 29, 1971   Cancelled Treatment:    Reason Eval/Treat Not Completed: Patient not medically ready (Pt repeat blood labs and Hgb is 4.3 critically low. Pt having more imaging done for possible bleed. Will hold OT eval for today.)  Flora Lipps, OTR/L Acute Rehabilitation Services Pager: [phone] Office: [phone]   Molly Dennis 01/28/2021, 10:23 AM

## 2021-01-28 NOTE — Progress Notes (Signed)
Repeat CBC 4.4. Patient remains normocardic and normotensive. D/w orthopedic surgeon and intra-operative blood loss does not correlate with current labs. Will transfuse 2u pRBC and obtain stat CT C/A/P with IV contrast. Known R RP hematoma and R pelvic sidewall hematoma on initial imaging, concern for expansion. Discussed with IR on call and will perform CTA A/P in case of need for AE.   Diamantina Monks, MD General and Trauma Surgery [facility]

## 2021-01-28 NOTE — Care Management Important Message (Signed)
Important Message  Patient Details  Name: Molly Dennis MRN: [id number] Date of Birth: 1971/01/02   Medicare Important Message Given:  Yes     Cullan Launer Stefan Church 01/28/2021, 2:48 PM

## 2021-01-28 NOTE — Progress Notes (Signed)
Hgb 4.4 on repeat CBC.  Text paged Dr. Bedelia Person to advise.

## 2021-01-28 NOTE — Progress Notes (Signed)
Orthopaedic Trauma Progress Note  SUBJECTIVE: Extremely drowsy this morning. Currently receiving a breathing treatment.   OBJECTIVE:  Vitals:   01/28/21 0751 01/28/21 0801  BP:  100/65  Pulse:  89  Resp:  14  Temp:  97.9 F (36.6 C)  SpO2: 100% 99%    General: Sitting up in bed, NAD. Drowsy Right Lower Extremity: Dressing CDI. Wound vac with good seal/function, ~100 mL drainage in canister. Able to wiggle toes a small amount. Notes decreased sensation throughout foot, unsure of reliability of sensory exam due to patient's drowsiness. Toes warm and well perfused  IMAGING: Stable post op imaging.   LABS:  Results for orders placed or performed during the hospital encounter of 01/25/21 (from the past 24 hour(s))  BLOOD TRANSFUSION REPORT - SCANNED     Status: None   Collection Time: 01/27/21 10:42 AM   Narrative   Ordered by an unspecified provider.  Basic metabolic panel     Status: Abnormal   Collection Time: 01/28/21  7:12 AM  Result Value Ref Range   Sodium 137 135 - 145 mmol/L   Potassium 3.8 3.5 - 5.1 mmol/L   Chloride 102 98 - 111 mmol/L   CO2 23 22 - 32 mmol/L   Glucose, Bld 145 (H) 70 - 99 mg/dL   BUN 13 6 - 20 mg/dL   Creatinine, Ser 5.85 0.44 - 1.00 mg/dL   Calcium 7.8 (L) 8.9 - 10.3 mg/dL   GFR, Estimated >27 >78 mL/min   Anion gap 12 5 - 15  CBC     Status: Abnormal   Collection Time: 01/28/21  7:12 AM  Result Value Ref Range   WBC 10.8 (H) 4.0 - 10.5 K/uL   RBC 1.42 (L) 3.87 - 5.11 MIL/uL   Hemoglobin 4.3 (LL) 12.0 - 15.0 g/dL   HCT 24.2 (L) 35.3 - 61.4 %   MCV 93.0 80.0 - 100.0 fL   MCH 30.3 26.0 - 34.0 pg   MCHC 32.6 30.0 - 36.0 g/dL   RDW 43.1 54.0 - 08.6 %   Platelets 132 (L) 150 - 400 K/uL   nRBC 0.2 0.0 - 0.2 %  VITAMIN D 25 Hydroxy (Vit-D Deficiency, Fractures)     Status: Abnormal   Collection Time: 01/28/21  7:12 AM  Result Value Ref Range   Vit D, 25-Hydroxy 25.29 (L) 30 - 100 ng/mL  CBC     Status: Abnormal   Collection Time: 01/28/21   8:35 AM  Result Value Ref Range   WBC 12.1 (H) 4.0 - 10.5 K/uL   RBC 1.47 (L) 3.87 - 5.11 MIL/uL   Hemoglobin 4.4 (LL) 12.0 - 15.0 g/dL   HCT 76.1 (L) 95.0 - 93.2 %   MCV 93.9 80.0 - 100.0 fL   MCH 29.9 26.0 - 34.0 pg   MCHC 31.9 30.0 - 36.0 g/dL   RDW 67.1 24.5 - 80.9 %   Platelets 140 (L) 150 - 400 K/uL   nRBC 0.0 0.0 - 0.2 %  Type and screen [facility]     Status: None (Preliminary result)   Collection Time: 01/28/21  8:35 AM  Result Value Ref Range   ABO/RH(D) PENDING    Antibody Screen PENDING    Sample Expiration      01/31/2021,2359 Performed at [facility], [address] [place], [place] [address]   Prepare RBC (crossmatch)     Status: None   Collection Time: 01/28/21  8:35 AM  Result Value Ref  Range   Order Confirmation      ORDER PROCESSED BY BLOOD BANK Performed at [facility], [address] [place], [place] [address]   Prepare RBC (crossmatch)     Status: None   Collection Time: 01/28/21  9:34 AM  Result Value Ref Range   Order Confirmation      ORDER PROCESSED BY BLOOD BANK Performed at [facility], [address] [place], [place] [address]     ASSESSMENT: Molly Dennis is a 50 y.o. female, 1 Day Post-Op s/p  ORIF RIGHT TIBIAL PLATEAU INTRAMEDULLARY NAIL RIGHT TIBIAL OPEN REDUCTION INTERNAL FIXATION (ORIF) CALCANEOUS FRACTURE  CV/Blood loss: Acute blood loss anemia, Hgb 4.4 this AM.  4 units PRBCs ordered  PLAN: Weightbearing: NWB RLE ROM: OK for ankle and knee ROM as tolerated Incisional and dressing care: Leave dressing in place, reinforce as needed Showering: Hold off on showering, ok for bed bath Orthopedic device(s): Wound Vac:RLE and PRAFO boot   Pain management:  Tylenol 100 mg q 6 hours Neurontin 300 mg  TID Dilaudid 0.5 mg q 2 hours PRN Robaxin 1000 mg q 8 hours  Oxycodone 5-15 mg q 4 hours PRN VTE prophylaxis:  Hold Lovenox until Hgb stabilizes , SCDs ID: Ceftriaxone for open fracture Foley/Lines:  Foley in place. KVO IVFs Impediments to Fracture Healing: Vit D level 25, start D3 supplementation Dispo: Transfuse PRBCs now per trauma team. PT/OT once able. We will tentatively plan for ORIF clavicle on Friday pedning ORE availability Follow - up plan:  TBD  Contact information:  Molly Merle MD, Molly Southward PA-C. After hours and holidays please check [email or web address] for group call information for [facility]   [place]olly [place] A. Michaelyn Barter, PA-C [phone] (office) [email or web address]

## 2021-01-28 NOTE — Anesthesia Postprocedure Evaluation (Signed)
Anesthesia Post Note  Patient: Briahna Pescador  Procedure(s) Performed: OPEN REDUCTION INTERNAL FIXATION (ORIF) TIBIAL PLATEAU (Right) INTRAMEDULLARY (IM) NAIL TIBIAL (Right) OPEN REDUCTION INTERNAL FIXATION (ORIF) CALCANEOUS FRACTURE (Right)     Patient location during evaluation: PACU Anesthesia Type: General Level of consciousness: awake and alert Pain management: pain level controlled Vital Signs Assessment: post-procedure vital signs reviewed and stable Respiratory status: spontaneous breathing, nonlabored ventilation, respiratory function stable and patient connected to nasal cannula oxygen Cardiovascular status: blood pressure returned to baseline and stable Postop Assessment: no apparent nausea or vomiting Anesthetic complications: no   No notable events documented.  Last Vitals:  Vitals:   01/27/21 2254 01/28/21 0100  BP: 107/74   Pulse: 92 83  Resp: 12 17  Temp: (!) 36.3 C   SpO2: 93% 95%    Last Pain:  Vitals:   01/28/21 0108  TempSrc:   PainSc: Ardean Larsen

## 2021-01-28 NOTE — Progress Notes (Signed)
Lab called to report critical Hgb of 4.3.  Dr. Bedelia Person advised and ordered repeat CBC.

## 2021-01-28 NOTE — Progress Notes (Signed)
Orthopedic Tech Progress Note Patient Details:  Molly Dennis 1970-10-06 [id number]  Patient ID: [name], female   DOB: June 30, 1971, 50 y.o.   MRN: [id number] Called order into hanger  Trinna Post 01/28/2021, 6:59 AM

## 2021-01-28 NOTE — Progress Notes (Signed)
   Trauma/Critical Care Follow Up Note  Subjective:    Overnight Issues:   Objective:  Vital signs for last 24 hours: Temp:  [96.1 F (35.6 C)-99.3 F (37.4 C)] 97.9 F (36.6 C) (06/28 0801) Pulse Rate:  [83-109] 89 (06/28 0801) Resp:  [11-19] 14 (06/28 0801) BP: (96-123)/(54-89) 100/65 (06/28 0801) SpO2:  [90 %-100 %] 99 % (06/28 0801)  Hemodynamic parameters for last 24 hours:    Intake/Output from previous day: 06/27 0701 - 06/28 0700 In: 2320 [P.O.:120; I.V.:1200; IV Piggyback:1000] Out: 645 [Urine:525; Drains:20; Blood:100]  Intake/Output this shift: No intake/output data recorded.  Vent settings for last 24 hours:    Physical Exam:  Gen: comfortable, no distress Neuro: non-focal exam HEENT: PERRL Neck: supple CV: RRR Pulm: unlabored breathing Abd: soft, NT GU: clear yellow urine Extr: wwp, no edema   Results for orders placed or performed during the hospital encounter of 01/25/21 (from the past 24 hour(s))  BLOOD TRANSFUSION REPORT - SCANNED     Status: None   Collection Time: 01/27/21 10:42 AM   Narrative   Ordered by an unspecified provider.    Assessment & Plan: The plan of care was discussed with the bedside nurse for the day, Almira Coaster, who is in agreement with this plan and no additional concerns were raised.   Present on Admission:  Acetabular fracture (HCC)    LOS: 3 days   Additional comments:I reviewed the patient's new clinical lab test results.   and I reviewed the patients new imaging test results.     [facility]   B scapula fx, L into glenoid - ortho c/s, Dr. Jena Gauss, nonop L clavicle fx - ortho c/s, Dr. Jena Gauss, nonop R acetabulum fx with small pelvic hematoma - ortho c/s, Dr. Jena Gauss, nonop R pubic rami fx - ortho c/s, Dr. Jena Gauss, nonop R open tib/fib fx and open tibial plateau fx - ortho c/s, Dr. Linna Caprice, s/p closed reduction and splinting 6/26, repeat I&D and ORIF of tibial plateau with IMN of tibia 6/27 R open calcaneus fx with  degloving- ortho c/s, Dr. Linna Caprice, s/p open reduction and splinting 6/26, s/p repeat I&D and ORIF 6/27 R great toe fx - ortho c/s, Dr. Jena Gauss, nonop Anemia - hgb 4.3 this AM, patient not tachycardic or hypotensive, will type and cross and recheck FEN - regular diet DVT - SCDs, LMWH Dispo - 4NP, aggressive pulm toileting add duonebs and flutter valve, therapies   Diamantina Monks, MD Trauma & General Surgery Please use [email or web address] to contact on call provider  01/28/2021  *Care during the described time interval was provided by me. I have reviewed this patient's available data, including medical history, events of note, physical examination and test results as part of my evaluation.

## 2021-01-29 DIAGNOSIS — S92411A Displaced fracture of proximal phalanx of right great toe, initial encounter for closed fracture: Secondary | ICD-10-CM

## 2021-01-29 DIAGNOSIS — S42109A Fracture of unspecified part of scapula, unspecified shoulder, initial encounter for closed fracture: Secondary | ICD-10-CM

## 2021-01-29 DIAGNOSIS — S91309A Unspecified open wound, unspecified foot, initial encounter: Secondary | ICD-10-CM

## 2021-01-29 DIAGNOSIS — S82201C Unspecified fracture of shaft of right tibia, initial encounter for open fracture type IIIA, IIIB, or IIIC: Secondary | ICD-10-CM

## 2021-01-29 DIAGNOSIS — S92041B Displaced other fracture of tuberosity of right calcaneus, initial encounter for open fracture: Secondary | ICD-10-CM

## 2021-01-29 DIAGNOSIS — S82401C Unspecified fracture of shaft of right fibula, initial encounter for open fracture type IIIA, IIIB, or IIIC: Secondary | ICD-10-CM

## 2021-01-29 DIAGNOSIS — S82141A Displaced bicondylar fracture of right tibia, initial encounter for closed fracture: Secondary | ICD-10-CM

## 2021-01-29 DIAGNOSIS — S42002A Fracture of unspecified part of left clavicle, initial encounter for closed fracture: Secondary | ICD-10-CM

## 2021-01-29 LAB — TYPE AND SCREEN
ABO/RH(D): AB NEG
Antibody Screen: NEGATIVE
Unit division: 0
Unit division: 0
Unit division: 0
Unit division: 0
Unit division: 0
Unit division: 0
Unit division: 0

## 2021-01-29 LAB — HEMOGLOBIN AND HEMATOCRIT, BLOOD
HCT: 24.9 % — ABNORMAL LOW (ref 36.0–46.0)
Hemoglobin: 8.5 g/dL — ABNORMAL LOW (ref 12.0–15.0)

## 2021-01-29 LAB — BPAM RBC
Blood Product Expiration Date: 202207072359
Blood Product Expiration Date: 202207082359
Blood Product Expiration Date: 202207082359
Blood Product Expiration Date: 202207132359
Blood Product Expiration Date: 202207232359
Blood Product Expiration Date: 202207232359
Blood Product Expiration Date: 202207242359
ISSUE DATE / TIME: 202206252226
ISSUE DATE / TIME: 202206281004
ISSUE DATE / TIME: 202206281004
ISSUE DATE / TIME: 202206281004
ISSUE DATE / TIME: 202206281004
ISSUE DATE / TIME: 202206291049
Unit Type and Rh: 5100
Unit Type and Rh: 600
Unit Type and Rh: 600
Unit Type and Rh: 600
Unit Type and Rh: 600
Unit Type and Rh: 600
Unit Type and Rh: 600

## 2021-01-29 LAB — BASIC METABOLIC PANEL
Anion gap: 6 (ref 5–15)
BUN: 9 mg/dL (ref 6–20)
CO2: 22 mmol/L (ref 22–32)
Calcium: 7.8 mg/dL — ABNORMAL LOW (ref 8.9–10.3)
Chloride: 108 mmol/L (ref 98–111)
Creatinine, Ser: 0.8 mg/dL (ref 0.44–1.00)
GFR, Estimated: >60 mL/min (ref >60)
Glucose, Bld: 120 mg/dL — ABNORMAL HIGH (ref 70–99)
Potassium: 3.9 mmol/L (ref 3.5–5.1)
Sodium: 136 mmol/L (ref 135–145)

## 2021-01-29 LAB — CBC
HCT: 20.8 % — ABNORMAL LOW (ref 36.0–46.0)
Hemoglobin: 6.8 g/dL — CL (ref 12.0–15.0)
MCH: 29.7 pg (ref 26.0–34.0)
MCHC: 32.7 g/dL (ref 30.0–36.0)
MCV: 90.8 fL (ref 80.0–100.0)
Platelets: 171 10*3/uL (ref 150–400)
RBC: 2.29 MIL/uL — ABNORMAL LOW (ref 3.87–5.11)
RDW: 15.1 % (ref 11.5–15.5)
WBC: 14.4 10*3/uL — ABNORMAL HIGH (ref 4.0–10.5)
nRBC: 0.3 % — ABNORMAL HIGH (ref 0.0–0.2)

## 2021-01-29 MED ORDER — FUROSEMIDE 10 MG/ML IJ SOLN
40.0000 mg | Freq: Once | INTRAMUSCULAR | Status: AC
  Administered 2021-01-29: 40 mg via INTRAVENOUS
  Filled 2021-01-29: qty 4

## 2021-01-29 MED ORDER — ARIPIPRAZOLE ER 400 MG IM PRSY
400.0000 mg | PREFILLED_SYRINGE | INTRAMUSCULAR | Status: DC
  Filled 2021-01-29: qty 400

## 2021-01-29 MED ORDER — ARIPIPRAZOLE ER 400 MG IM PRSY
400.0000 mg | PREFILLED_SYRINGE | INTRAMUSCULAR | Status: DC
  Filled 2021-01-29 (×2): qty 400

## 2021-01-29 MED ORDER — IPRATROPIUM-ALBUTEROL 0.5-2.5 (3) MG/3ML IN SOLN
3.0000 mL | Freq: Four times a day (QID) | RESPIRATORY_TRACT | Status: DC | PRN
  Administered 2021-01-29: 3 mL via RESPIRATORY_TRACT
  Filled 2021-01-29: qty 3

## 2021-01-29 NOTE — Progress Notes (Signed)
Pt still very lethargic and drowsy.  Spoke with Wells Guiles, PA regarding monthly dose of Abilify IM scheduled today.  Per Tresa Endo, hold dose until tomorrow until pt becomes more A&O.  Also discussed with pharmacist, Jonny Ruiz who was advised and will move dose to 01/30/21.

## 2021-01-29 NOTE — Progress Notes (Signed)
Patient ID: Molly Dennis, female   DOB: [birth year], 50 y.o.   MRN: [id number] 2 Days Post-Op   Subjective: Receiving 1u PRBC No new complaints ROS negative except as listed above. Objective: Vital signs in last 24 hours: Temp:  [96.9 F (36.1 C)-97.8 F (36.6 C)] 96.9 F (36.1 C) (06/29 0753) Pulse Rate:  [82-93] 82 (06/29 0753) Resp:  [9-18] 12 (06/29 0753) BP: (100-128)/(62-85) 128/85 (06/29 0753) SpO2:  [93 %-100 %] 100 % (06/29 0811) FiO2 (%):  [75 %] 75 % (06/29 0551) Last BM Date:  (PTA)  Intake/Output from previous day: 06/28 0701 - 06/29 0700 In: 1338.1 [P.O.:160; I.V.:978.1; IV Piggyback:200] Out: 1095 [Urine:1050; Drains:45] Intake/Output this shift: No intake/output data recorded.  General appearance: cooperative Resp: wheezes and rhonchi Cardio: regular rate and rhythm GI: soft, non-tender; bowel sounds normal; no masses,  no organomegaly Extremities: RLE orhto dressing and VAC, toes warm  Lab Results: CBC  Recent Labs    01/28/21 0835 01/28/21 1133 01/29/21 0341  WBC 12.1*  --  14.4*  HGB 4.4* 7.7* 6.8*  HCT 13.8* 23.6* 20.8*  PLT 140*  --  171   BMET Recent Labs    01/28/21 0712 01/29/21 0341  NA 137 136  K 3.8 3.9  CL 102 108  CO2 23 22  GLUCOSE 145* 120*  BUN 13 9  CREATININE 0.82 0.80  CALCIUM 7.8* 7.8*   PT/INR No results for input(s): LABPROT, INR in the last 72 hours. ABG No results for input(s): PHART, HCO3 in the last 72 hours.  Invalid input(s): PCO2, PO2    Anti-infectives: Anti-infectives (From admission, onward)    Start     Dose/Rate Route Frequency Ordered Stop   01/28/21 0600  ceFAZolin (ANCEF) IVPB 2g/100 mL premix  Status:  Discontinued        2 g 200 mL/hr over 30 Minutes Intravenous On call to O.R. 01/27/21 1717 01/27/21 2253   01/28/21 0400  cefTRIAXone (ROCEPHIN) 2 g in sodium chloride 0.9 % 100 mL IVPB        2 g 200 mL/hr over 30 Minutes Intravenous Every 24 hours 01/27/21 2253 01/31/21 0359   01/27/21 1847   tobramycin (NEBCIN) powder  Status:  Discontinued          As needed 01/27/21 1847 01/27/21 2109   01/27/21 1847  vancomycin (VANCOCIN) powder  Status:  Discontinued          As needed 01/27/21 1848 01/27/21 2109   01/27/21 1643  ceFAZolin (ANCEF) 2-4 GM/100ML-% IVPB       Note to Pharmacy: Phebe Colla   : cabinet override      01/27/21 1643 01/28/21 0459   01/26/21 0530  cefTRIAXone (ROCEPHIN) 2 g in sodium chloride 0.9 % 100 mL IVPB  Status:  Discontinued        2 g 200 mL/hr over 30 Minutes Intravenous Every 24 hours 01/26/21 0437 01/27/21 2107   01/25/21 2015  ceFAZolin (ANCEF) IVPB 2g/100 mL premix        2 g 200 mL/hr over 30 Minutes Intravenous  Once 01/25/21 2009 01/25/21 2054       Assessment/Plan: [facility]   B scapula fx, L into glenoid - ortho c/s, Dr. Jena Gauss, nonop L clavicle fx - ortho c/s, Dr. Jena Gauss, nonop R acetabulum fx with small pelvic hematoma - ortho c/s, Dr. Jena Gauss, nonop R pubic rami fx - ortho c/s, Dr. Jena Gauss, nonop R open tib/fib fx and open tibial plateau fx - ortho c/s, Dr.  Swinteck, s/p closed reduction and splinting 6/26, repeat I&D and ORIF of tibial plateau with IMN of tibia 6/27 by Dr. Jena Gauss R open calcaneus fx with degloving- ortho c/s, Dr. Linna Caprice, s/p open reduction and splinting 6/26, s/p repeat I&D and ORIF 6/27 by Dr. Jena Gauss R great toe fx - ortho c/s, Dr. Jena Gauss, nonop ABL anemia - suspect equilibrating, is also 6L+, 1u PRBC this AM for Hb 6.8. FEN - regular diet, lasix x 1 now DVT - SCDs, LMWH Dispo - 4NP, aggressive pulm toileting, BDs, and flutter valve. PT/OT   LOS: 4 days    Violeta Gelinas, MD, MPH, FACS Trauma & General Surgery Use [email or web address] to contact on call provider  01/29/2021

## 2021-01-29 NOTE — Progress Notes (Signed)
Rehab Admissions Coordinator Note:  Patient was screened by Molly Dennis for appropriateness for an Inpatient Acute Rehab Consult per therapy recs. Patient with NWB status to 3 out of 4 limbs at this time. Patient not at a level to consider CIR admit. Would recommend other rehab venue options to be pursued. I will not place rehab consult order.Molly Dupes RN MSN 01/29/2021, 7:04 PM  I can be reached at [phone].

## 2021-01-29 NOTE — Progress Notes (Signed)
Orthopedic Tech Progress Note Patient Details:  Molly Dennis [birth year] [id number]  Spoke with PA Michaelyn Barter about patient needing a PRAFO BOOT, applied shortly ago  Ortho Devices Type of Ortho Device: Prafo boot/shoe Ortho Device/Splint Location: RLE Ortho Device/Splint Interventions: Ordered, Application   Post Interventions Patient Tolerated: Well Instructions Provided: Care of device  Donald Pore 01/29/2021, 5:04 PM

## 2021-01-29 NOTE — Evaluation (Addendum)
Physical Therapy Evaluation Patient Details Name: Molly Dennis MRN: [id number] DOB: 09-30-1970 Today's Date: 01/29/2021   History of Present Illness  Pt is a 50 y.o. 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 Pt sustained bil scapula fxs (L into glenoid), L clavicle fx, R acetabulum fx with small pelvic hematoma, R pubic rami fx, R open tib/fib fx and open tibial plateau fx, R open calcaneus fx with degloving, R great toe fx, and right sacral ala and right  transverse processes of the L3, L4 and L5 vertebral bodies. S/p closed reduction and splinting of R leg 6/26. S/p ORIF of R bicondylar tibial plateau fx, ORIF of R calcaneus, IM nailing of R tibia fx, and wound vac placement to R leg 6/27. PMH: CVA, asthma, COPD.   Clinical Impression  Pt presents with condition above and deficits mentioned below, see PT Problem List. PTA, she was living in an apartment with 9 STE and functioning independently. Currently, pt is very lethargic and experiencing a lot of pain along with orders to be NWB through 3/4 limbs, limiting her mobility. Despite these limitations she was motivated and agreeable to participate in the session. Pt required TA x2 for bed mobility. She displays deficits in overall strength (L weaker than R with hx of CVA affecting L side), edema in her L UE, limitations in ROM in all limbs, decreased activity tolerance, and impaired balance that place her at high risk for falls. Will continue to follow acutely. As pt was independent at her PLOF, she would greatly benefit from intensive therapy in the CIR setting to maximize her safety and independence with all functional mobility prior to return home.    Follow Up Recommendations CIR;Supervision/Assistance - 24 hour    Equipment Recommendations  Wheelchair (measurements PT);Wheelchair cushion (measurements PT);Hospital bed;Other (comment) (mechanical lift; may change with progression)    Recommendations for Other  Services Rehab consult     Precautions / Restrictions Precautions Precautions: Fall;Back Precaution Booklet Issued: No Precaution Comments: bil shoulder immobilizers for comfort; R leg splint; R leg wound vac Restrictions Weight Bearing Restrictions: Yes RUE Weight Bearing: Non weight bearing LUE Weight Bearing: Non weight bearing RLE Weight Bearing: Non weight bearing      Mobility  Bed Mobility Overal bed mobility: Needs Assistance Bed Mobility: Supine to Sit;Sit to Supine     Supine to sit: Total assist;+2 for physical assistance;HOB elevated;+2 for safety/equipment Sit to supine: Total assist;+2 for physical assistance;+2 for safety/equipment;HOB elevated   General bed mobility comments: Using bed pad, provided TAx2 to transition pt supine <> sit EOB with hellicopter method as pt is NWB in bil UEs and R lower extremity. Cued pt to bring UEs across chest and assist with bringing legs to EOB.    Transfers                 General transfer comment: NT  Ambulation/Gait             General Gait Details: Unable on L leg this date.  Stairs            Wheelchair Mobility    Modified Rankin (Stroke Patients Only)       Balance Overall balance assessment: Needs assistance Sitting-balance support: No upper extremity supported;Feet supported Sitting balance-Leahy Scale: Fair Sitting balance - Comments: Static sitting EOB with min guard-minA.       Standing balance comment: NT  Pertinent Vitals/Pain Pain Assessment: 0-10 Pain Score: 10-Worst pain ever Pain Location: legs and arms Pain Descriptors / Indicators: Discomfort;Grimacing;Guarding;Operative site guarding;Moaning Pain Intervention(s): Monitored during session;Limited activity within patient's tolerance;Repositioned;Patient requesting pain meds-RN notified    Home Living Family/patient expects to be discharged to:: Private residence Living  Arrangements: Alone Available Help at Discharge: Friend(s);Available PRN/intermittently Type of Home: Apartment Home Access: Stairs to enter Entrance Stairs-Rails: Can reach both Entrance Stairs-Number of Steps: 9 Home Layout: One level Home Equipment: Walker - 2 wheels;Bedside commode;Shower seat Additional Comments: Hx of L sided weakness from prior CVA.    Prior Function Level of Independence: Independent         Comments: Pt does not work.     Hand Dominance   Dominant Hand: Right    Extremity/Trunk Assessment   Upper Extremity Assessment Upper Extremity Assessment: Defer to OT evaluation;Generalized weakness (L UE edema noted)    Lower Extremity Assessment Lower Extremity Assessment: RLE deficits/detail;LLE deficits/detail;Generalized weakness RLE Deficits / Details: R lower leg in spint with ACE wrap; pain, edema, and fxs limiting strength and ROM RLE Sensation: decreased light touch (at toes) RLE Coordination: decreased gross motor;decreased fine motor LLE Deficits / Details: Hip flexion and knee extension MMT of 2+ this date, may be limited by pain but pt with hx of L-sided weakness from prior CVA LLE Coordination: decreased gross motor;decreased fine motor    Cervical / Trunk Assessment Cervical / Trunk Assessment: Normal  Communication   Communication: Expressive difficulties (slurring speech)  Cognition Arousal/Alertness: Lethargic Behavior During Therapy: Flat affect Overall Cognitive Status: Difficult to assess                                 General Comments: Pt with soft voice and slurred speech throughout. Pt lethargic and needing continual cues stating her name to gain her attention. Follows single-step commands with increased time.      General Comments General comments (skin integrity, edema, etc.): VSS    Exercises     Assessment/Plan    PT Assessment Patient needs continued PT services  PT Problem List Decreased  strength;Decreased range of motion;Decreased activity tolerance;Decreased balance;Decreased mobility;Decreased coordination;Decreased cognition;Decreased knowledge of use of DME;Decreased safety awareness;Decreased knowledge of precautions;Impaired sensation;Decreased skin integrity;Pain       PT Treatment Interventions DME instruction;Gait training;Functional mobility training;Therapeutic activities;Therapeutic exercise;Balance training;Neuromuscular re-education;Cognitive remediation;Patient/family education;Wheelchair mobility training    PT Goals (Current goals can be found in the Care Plan section)  Acute Rehab PT Goals Patient Stated Goal: to lay back down PT Goal Formulation: With patient Time For Goal Achievement: 02/12/21 Potential to Achieve Goals: Good    Frequency Min 4X/week   Barriers to discharge        Co-evaluation PT/OT/SLP Co-Evaluation/Treatment: Yes Reason for Co-Treatment: Complexity of the patient's impairments (multi-system involvement);For patient/therapist safety;To address functional/ADL transfers PT goals addressed during session: Mobility/safety with mobility;Balance         AM-PAC PT "6 Clicks" Mobility  Outcome Measure Help needed turning from your back to your side while in a flat bed without using bedrails?: Total Help needed moving from lying on your back to sitting on the side of a flat bed without using bedrails?: Total Help needed moving to and from a bed to a chair (including a wheelchair)?: Total Help needed standing up from a chair using your arms (e.g., wheelchair or bedside chair)?: Total Help needed to walk in hospital room?: Total Help  needed climbing 3-5 steps with a railing? : Total 6 Click Score: 6    End of Session Equipment Utilized During Treatment: Oxygen Activity Tolerance: Patient limited by lethargy;Patient limited by pain Patient left: in bed;with call bell/phone within reach;with bed alarm set;with nursing/sitter in  room Nurse Communication: Mobility status;Patient requests pain meds PT Visit Diagnosis: Muscle weakness (generalized) (M62.81);Difficulty in walking, not elsewhere classified (R26.2);Pain Pain - Right/Left:  (bil) Pain - part of body: Arm;Leg    Time: 8338-2505 PT Time Calculation (min) (ACUTE ONLY): 33 min   Charges:   PT Evaluation $PT Eval Moderate Complexity: 1 Mod          Raymond Gurney, PT, DPT Acute Rehabilitation Services  Pager: [phone] Office: [phone]   Jewel Baize 01/29/2021, 5:14 PM

## 2021-01-29 NOTE — Progress Notes (Addendum)
Critical Lab  HGB is 6.8  Taken@  0341  Reported by Messan Houegnifo @ [phone]  0441 Spoke w/ on call MD Lovich, told to transfuse 1 unit.   No signs/symptoms of bleeding. Pt has minimal output in wound vac.

## 2021-01-29 NOTE — Progress Notes (Signed)
Pt having coughing episodes when consuming liquids and foods.  Wells Guiles, PA advised and will order Speech Eval to assess.
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
## 2021-01-29 NOTE — Progress Notes (Signed)
Pt blood started w/o complications. Vitals are WNLs. Will monitor.

## 2021-01-30 LAB — CBC
HCT: 25.1 % — ABNORMAL LOW (ref 36.0–46.0)
Hemoglobin: 8.2 g/dL — ABNORMAL LOW (ref 12.0–15.0)
MCH: 29.6 pg (ref 26.0–34.0)
MCHC: 32.7 g/dL (ref 30.0–36.0)
MCV: 90.6 fL (ref 80.0–100.0)
Platelets: 209 10*3/uL (ref 150–400)
RBC: 2.77 MIL/uL — ABNORMAL LOW (ref 3.87–5.11)
RDW: 15.9 % — ABNORMAL HIGH (ref 11.5–15.5)
WBC: 9.9 10*3/uL (ref 4.0–10.5)
nRBC: 0.3 % — ABNORMAL HIGH (ref 0.0–0.2)

## 2021-01-30 LAB — BASIC METABOLIC PANEL
Anion gap: 8 (ref 5–15)
BUN: 13 mg/dL (ref 6–20)
CO2: 27 mmol/L (ref 22–32)
Calcium: 8 mg/dL — ABNORMAL LOW (ref 8.9–10.3)
Chloride: 106 mmol/L (ref 98–111)
Creatinine, Ser: 0.84 mg/dL (ref 0.44–1.00)
GFR, Estimated: >60 mL/min (ref >60)
Glucose, Bld: 82 mg/dL (ref 70–99)
Potassium: 3.5 mmol/L (ref 3.5–5.1)
Sodium: 141 mmol/L (ref 135–145)

## 2021-01-30 LAB — GLUCOSE, CAPILLARY
Glucose-Capillary: 77 mg/dL (ref 70–99)
Glucose-Capillary: 81 mg/dL (ref 70–99)
Glucose-Capillary: 86 mg/dL (ref 70–99)
Glucose-Capillary: 84 mg/dL (ref 70–99)

## 2021-01-30 MED ORDER — QUETIAPINE FUMARATE 200 MG PO TABS
400.0000 mg | ORAL_TABLET | Freq: Two times a day (BID) | ORAL | Status: DC
  Administered 2021-01-31 – 2021-02-06 (×14): 400 mg
  Filled 2021-01-30: qty 8
  Filled 2021-01-30 (×2): qty 2
  Filled 2021-01-30: qty 8
  Filled 2021-01-30: qty 2
  Filled 2021-01-30: qty 4
  Filled 2021-01-30 (×3): qty 2
  Filled 2021-01-30: qty 8
  Filled 2021-01-30: qty 4
  Filled 2021-01-30 (×2): qty 2
  Filled 2021-01-30: qty 4
  Filled 2021-01-30 (×2): qty 2
  Filled 2021-01-30: qty 8
  Filled 2021-01-30 (×2): qty 4
  Filled 2021-01-30 (×2): qty 8
  Filled 2021-01-30 (×2): qty 2

## 2021-01-30 MED ORDER — LEVETIRACETAM IN NACL 500 MG/100ML IV SOLN
500.0000 mg | INTRAVENOUS | Status: DC
  Administered 2021-01-30 – 2021-02-01 (×3): 500 mg via INTRAVENOUS
  Filled 2021-01-30 (×3): qty 100

## 2021-01-30 MED ORDER — POLYETHYLENE GLYCOL 3350 17 G PO PACK
17.0000 g | PACK | Freq: Every day | ORAL | Status: DC | PRN
  Administered 2021-02-03: 17 g
  Filled 2021-01-30: qty 1

## 2021-01-30 MED ORDER — GABAPENTIN 300 MG PO CAPS
300.0000 mg | ORAL_CAPSULE | Freq: Three times a day (TID) | ORAL | Status: DC
  Administered 2021-01-31 – 2021-02-06 (×21): 300 mg
  Filled 2021-01-30 (×22): qty 1

## 2021-01-30 MED ORDER — ATORVASTATIN CALCIUM 40 MG PO TABS
40.0000 mg | ORAL_TABLET | Freq: Every day | ORAL | Status: DC
  Administered 2021-01-31 – 2021-02-06 (×7): 40 mg
  Filled 2021-01-30 (×7): qty 1

## 2021-01-30 MED ORDER — ACETAMINOPHEN 160 MG/5ML PO SOLN
650.0000 mg | Freq: Four times a day (QID) | ORAL | Status: DC | PRN
  Administered 2021-02-01: 650 mg
  Filled 2021-01-30: qty 20.3

## 2021-01-30 MED ORDER — ORAL CARE MOUTH RINSE
15.0000 mL | Freq: Two times a day (BID) | OROMUCOSAL | Status: DC
  Administered 2021-01-30 – 2021-02-11 (×25): 15 mL via OROMUCOSAL

## 2021-01-30 MED ORDER — DOCUSATE SODIUM 50 MG/5ML PO LIQD
100.0000 mg | Freq: Two times a day (BID) | ORAL | Status: DC
  Administered 2021-01-31 – 2021-02-06 (×14): 100 mg
  Filled 2021-01-30 (×15): qty 10

## 2021-01-30 MED ORDER — AMITRIPTYLINE HCL 50 MG PO TABS
50.0000 mg | ORAL_TABLET | Freq: Every day | ORAL | Status: DC
  Administered 2021-01-31 – 2021-02-11 (×12): 50 mg
  Filled 2021-01-30 (×2): qty 2
  Filled 2021-01-30: qty 1
  Filled 2021-01-30 (×2): qty 2
  Filled 2021-01-30 (×2): qty 1
  Filled 2021-01-30: qty 2
  Filled 2021-01-30 (×2): qty 1
  Filled 2021-01-30: qty 2
  Filled 2021-01-30: qty 1
  Filled 2021-01-30: qty 2

## 2021-01-30 MED ORDER — DEXTROSE-NACL 5-0.45 % IV SOLN
INTRAVENOUS | Status: DC
  Administered 2021-01-30: 1000 mL via INTRAVENOUS

## 2021-01-30 MED ORDER — PIVOT 1.5 CAL PO LIQD
1000.0000 mL | ORAL | Status: DC
  Administered 2021-01-31: 1000 mL
  Filled 2021-01-30 (×2): qty 1000

## 2021-01-30 MED ORDER — OXYCODONE HCL 5 MG/5ML PO SOLN
10.0000 mg | ORAL | Status: DC | PRN
  Administered 2021-02-01 – 2021-02-06 (×18): 10 mg
  Filled 2021-01-30 (×18): qty 10

## 2021-01-30 MED ORDER — GUAIFENESIN 100 MG/5ML PO SOLN
10.0000 mL | ORAL | Status: DC
  Administered 2021-01-31 – 2021-02-07 (×40): 200 mg
  Filled 2021-01-30 (×42): qty 10
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
## 2021-01-30 NOTE — Plan of Care (Signed)
  Problem: Clinical Measurements: Goal: Respiratory complications will improve Outcome: Progressing   Problem: Clinical Measurements: Goal: Cardiovascular complication will be avoided Outcome: Progressing   Problem: Pain Managment: Goal: General experience of comfort will improve Outcome: Progressing   Problem: Safety: Goal: Ability to remain free from injury will improve Outcome: Progressing   Problem: Skin Integrity: Goal: Risk for impaired skin integrity will decrease Outcome: Progressing   

## 2021-01-30 NOTE — Progress Notes (Addendum)
Patient ID: Molly Dennis, female   DOB: 1971-06-12, 50 y.o.   MRN: [id number] 3 Days Post-Op   Subjective: No new complaints, did poorly with ST ROS negative except as listed above. Objective: Vital signs in last 24 hours: Temp:  [97.3 F (36.3 C)-97.8 F (36.6 C)] 97.8 F (36.6 C) (06/30 0258) Pulse Rate:  [88-95] 94 (06/30 0258) Resp:  [13-19] 13 (06/30 0258) BP: (109-127)/(61-91) 126/81 (06/30 0258) SpO2:  [93 %-100 %] 93 % (06/30 0836) Last BM Date:  (PTA)  Intake/Output from previous day: 06/29 0701 - 06/30 0700 In: 3326.7 [P.O.:700; I.V.:2080.4; Blood:446.3; IV Piggyback:100] Out: 4100 [Urine:4100] Intake/Output this shift: No intake/output data recorded.  General appearance: cooperative Resp: clear to auscultation bilaterally Cardio: regular rate and rhythm GI: soft, NT Extremities: ortho dressing and VAC RLE Neurologic: Mental status: Alert, oriented, thought content appropriate  Lab Results: CBC  Recent Labs    01/29/21 0341 01/29/21 1207 01/30/21 0326  WBC 14.4*  --  9.9  HGB 6.8* 8.5* 8.2*  HCT 20.8* 24.9* 25.1*  PLT 171  --  209   BMET Recent Labs    01/29/21 0341 01/30/21 0326  NA 136 141  K 3.9 3.5  CL 108 106  CO2 22 27  GLUCOSE 120* 82  BUN 9 13  CREATININE 0.80 0.84  CALCIUM 7.8* 8.0*   PT/INR No results for input(s): LABPROT, INR in the last 72 hours. ABG No results for input(s): PHART, HCO3 in the last 72 hours.  Invalid input(s): PCO2, PO2  Studies/Results:   Anti-infectives: Anti-infectives (From admission, onward)    Start     Dose/Rate Route Frequency Ordered Stop   01/28/21 0600  ceFAZolin (ANCEF) IVPB 2g/100 mL premix  Status:  Discontinued        2 g 200 mL/hr over 30 Minutes Intravenous On call to O.R. 01/27/21 1717 01/27/21 2253   01/28/21 0400  cefTRIAXone (ROCEPHIN) 2 g in sodium chloride 0.9 % 100 mL IVPB        2 g 200 mL/hr over 30 Minutes Intravenous Every 24 hours 01/27/21 2253 01/30/21 0447   01/27/21 1847   tobramycin (NEBCIN) powder  Status:  Discontinued          As needed 01/27/21 1847 01/27/21 2109   01/27/21 1847  vancomycin (VANCOCIN) powder  Status:  Discontinued          As needed 01/27/21 1848 01/27/21 2109   01/27/21 1643  ceFAZolin (ANCEF) 2-4 GM/100ML-% IVPB       Note to Pharmacy: Phebe Colla   : cabinet override      01/27/21 1643 01/28/21 0459   01/26/21 0530  cefTRIAXone (ROCEPHIN) 2 g in sodium chloride 0.9 % 100 mL IVPB  Status:  Discontinued        2 g 200 mL/hr over 30 Minutes Intravenous Every 24 hours 01/26/21 0437 01/27/21 2107   01/25/21 2015  ceFAZolin (ANCEF) IVPB 2g/100 mL premix        2 g 200 mL/hr over 30 Minutes Intravenous  Once 01/25/21 2009 01/25/21 2054       Assessment/Plan: [facility]   B scapula fx, L into glenoid - ortho c/s, Dr. Jena Gauss, nonop L clavicle fx - ortho c/s, Dr. Jena Gauss, nonop R acetabulum fx with small pelvic hematoma - ortho c/s, Dr. Jena Gauss, nonop R pubic rami fx - ortho c/s, Dr. Jena Gauss, nonop R open tib/fib fx and open tibial plateau fx - ortho c/s, Dr. Linna Caprice, s/p closed reduction and splinting 6/26, repeat  I&D and ORIF of tibial plateau with IMN of tibia 6/27 by Dr. Jena Gauss R open calcaneus fx with degloving- ortho c/s, Dr. Linna Caprice, s/p open reduction and splinting 6/26, s/p repeat I&D and ORIF 6/27 by Dr. Jena Gauss Psychiatric HX - home meds R great toe fx - ortho c/s, Dr. Jena Gauss, nonop ABL anemia - Hb 8.2 S/P TF yesterday FEN - did very poorly with ST - they rec NPO. Significant dysphagia and some of this may be from her previous stroke. Will place NGT and change meds to per tube. DVT - SCDs, LMWH Dispo - 4NP, aggressive pulm toileting, BDs, and flutter valve. PT/OT. Likley will need SNF.  LOS: 5 days    Violeta Gelinas, MD, MPH, FACS Trauma & General Surgery Use [email or web address] to contact on call provider  01/30/2021
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
## 2021-01-30 NOTE — Plan of Care (Signed)
  Problem: Education: Goal: Knowledge of General Education information will improve Description Including pain rating scale, medication(s)/side effects and non-pharmacologic comfort measures Outcome: Progressing   

## 2021-01-30 NOTE — Progress Notes (Addendum)
Orthopaedic Trauma Progress Note  SUBJECTIVE: Much more alert today. Pain manageable. Not having any pain in left clavicle/shoulder. Patient does not want surgery on her clavicle. Mom and sister at bedside  OBJECTIVE:  Vitals:   01/30/21 0258 01/30/21 0836  BP: 126/81   Pulse: 94   Resp: 13   Temp: 97.8 F (36.6 C)   SpO2: 95% 93%    General: Sitting up in bed, NAD.  RLE: Dressing CDI. Wound vac with good seal/function, ~150 mL drainage in canister. Able to wiggle toes. Endorses sensation to light touch throughout extremity. Toes warm and well perfused LUE: Non tender over clavicle. Tolerates passive forward elevation of shoulder to about 120 degrees without pain. Sensation intact. Neurovascualrly intact  IMAGING: Stable post op imaging.   LABS:  Results for orders placed or performed during the hospital encounter of 01/25/21 (from the past 24 hour(s))  Basic metabolic panel     Status: Abnormal   Collection Time: 01/30/21  3:26 AM  Result Value Ref Range   Sodium 141 135 - 145 mmol/L   Potassium 3.5 3.5 - 5.1 mmol/L   Chloride 106 98 - 111 mmol/L   CO2 27 22 - 32 mmol/L   Glucose, Bld 82 70 - 99 mg/dL   BUN 13 6 - 20 mg/dL   Creatinine, Ser 5.36 0.44 - 1.00 mg/dL   Calcium 8.0 (L) 8.9 - 10.3 mg/dL   GFR, Estimated >46 >80 mL/min   Anion gap 8 5 - 15  CBC     Status: Abnormal   Collection Time: 01/30/21  3:26 AM  Result Value Ref Range   WBC 9.9 4.0 - 10.5 K/uL   RBC 2.77 (L) 3.87 - 5.11 MIL/uL   Hemoglobin 8.2 (L) 12.0 - 15.0 g/dL   HCT 32.1 (L) 22.4 - 82.5 %   MCV 90.6 80.0 - 100.0 fL   MCH 29.6 26.0 - 34.0 pg   MCHC 32.7 30.0 - 36.0 g/dL   RDW 00.3 (H) 70.4 - 88.8 %   Platelets 209 150 - 400 K/uL   nRBC 0.3 (H) 0.0 - 0.2 %  Glucose, capillary     Status: None   Collection Time: 01/30/21 12:27 PM  Result Value Ref Range   Glucose-Capillary 77 70 - 99 mg/dL    ASSESSMENT: Molly Dennis is a 50 y.o. female, 3 Days Post-Op s/p  ORIF RIGHT TIBIAL  PLATEAU INTRAMEDULLARY NAIL RIGHT TIBIAL OPEN REDUCTION INTERNAL FIXATION (ORIF) CALCANEOUS FRACTURE  CV/Blood loss: Acute blood loss anemia, Hgb 8.2 today. Received 4 units PRBCs 01/28/21  PLAN: Weightbearing: NWB RLE ROM: OK for ankle and knee ROM as tolerated Incisional and dressing care: Leave dressing in place, reinforce as needed Showering: Hold off on showering, ok for bed bath Orthopedic device(s): Wound Vac:RLE and PRAFO boot   Pain management:  Tylenol 100 mg q 6 hours Neurontin 300 mg  TID Dilaudid 0.5 mg q 2 hours PRN Robaxin 1000 mg q 8 hours  Oxycodone 5-15 mg q 4 hours PRN VTE prophylaxis:  Loevnox once Hgb stable , SCDs ID: Ceftriaxone for open fracture completed Foley/Lines: Foley in place. KVO IVFs Impediments to Fracture Healing: Vit D level 25, continue D3 supplementation Dispo: PT/OT as able. We will plan for non-op treatment of L clavicle. Plan to remove/change wound vac RLE tomorrow 01/31/21 Follow - up plan:  TBD  Contact information:  Truitt Merle MD, Ulyses Southward PA-C. After hours and holidays please check [email or web address] for group call information for [facility]   Molly Done A. Ricci Barker, PA-C [phone] (office) [email or web address]

## 2021-01-30 NOTE — Progress Notes (Signed)
Attempted to place NG tube x 3 unsuccessful, MD made aware and plan for cortrak placement tomorrow. Patient to remain NPO.   Vona Whiters, Kae Heller, RN

## 2021-01-31 ENCOUNTER — Inpatient Hospital Stay (HOSPITAL_COMMUNITY): Payer: Medicare Other

## 2021-01-31 LAB — GLUCOSE, CAPILLARY
Glucose-Capillary: 112 mg/dL — ABNORMAL HIGH (ref 70–99)
Glucose-Capillary: 115 mg/dL — ABNORMAL HIGH (ref 70–99)
Glucose-Capillary: 131 mg/dL — ABNORMAL HIGH (ref 70–99)
Glucose-Capillary: 82 mg/dL (ref 70–99)
Glucose-Capillary: 87 mg/dL (ref 70–99)
Glucose-Capillary: 94 mg/dL (ref 70–99)

## 2021-01-31 LAB — CBC
HCT: 27 % — ABNORMAL LOW (ref 36.0–46.0)
Hemoglobin: 8.8 g/dL — ABNORMAL LOW (ref 12.0–15.0)
MCH: 29.7 pg (ref 26.0–34.0)
MCHC: 32.6 g/dL (ref 30.0–36.0)
MCV: 91.2 fL (ref 80.0–100.0)
Platelets: 286 10*3/uL (ref 150–400)
RBC: 2.96 MIL/uL — ABNORMAL LOW (ref 3.87–5.11)
RDW: 15.5 % (ref 11.5–15.5)
WBC: 10.3 10*3/uL (ref 4.0–10.5)
nRBC: 0.3 % — ABNORMAL HIGH (ref 0.0–0.2)

## 2021-01-31 LAB — BASIC METABOLIC PANEL
Anion gap: 7 (ref 5–15)
BUN: 11 mg/dL (ref 6–20)
CO2: 27 mmol/L (ref 22–32)
Calcium: 8.3 mg/dL — ABNORMAL LOW (ref 8.9–10.3)
Chloride: 105 mmol/L (ref 98–111)
Creatinine, Ser: 0.68 mg/dL (ref 0.44–1.00)
GFR, Estimated: >60 mL/min (ref >60)
Glucose, Bld: 95 mg/dL (ref 70–99)
Potassium: 3.2 mmol/L — ABNORMAL LOW (ref 3.5–5.1)
Sodium: 139 mmol/L (ref 135–145)

## 2021-01-31 MED ORDER — PIVOT 1.5 CAL PO LIQD
1000.0000 mL | ORAL | Status: DC
  Administered 2021-02-01 – 2021-02-03 (×2): 1000 mL
  Filled 2021-01-31 (×7): qty 1000

## 2021-01-31 MED ORDER — CHLORHEXIDINE GLUCONATE CLOTH 2 % EX PADS
6.0000 | MEDICATED_PAD | Freq: Every day | CUTANEOUS | Status: DC
  Administered 2021-01-31 – 2021-02-11 (×12): 6 via TOPICAL
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
## 2021-01-31 NOTE — Procedures (Signed)
Cortrak  Person Inserting Tube:  Shykeria Sakamoto, Verdon Cummins, RD Tube Type:  Cortrak - 43 inches Tube Size:  10 Tube Location:  Left nare Initial Placement:  Stomach Secured by: Bridle Technique Used to Measure Tube Placement:  Marking at nare/corner of mouth Cortrak Secured At:  61 cm  Cortrak Tube Team Note:  Consult received to place a Cortrak feeding tube.   X-ray is required, abdominal x-ray has been ordered by the Cortrak team. Please confirm tube placement before using the Cortrak tube.   If the tube becomes dislodged please keep the tube and contact the Cortrak team at [email or web address] ([email or web address]) for replacement.  If after hours and replacement cannot be delayed, place a NG tube and confirm placement with an abdominal x-ray.    Eugene Gavia, MS, RD, LDN (she/her/hers) RD pager number and weekend/on-call pager number located in Amion.

## 2021-01-31 NOTE — Progress Notes (Signed)
Orthopedic Tech Progress Note Patient Details:  Jolonda Gomm 1971/03/01 [id number] Left PRAFO at bedside. Ortho Devices Type of Ortho Device: Prafo boot/shoe (Simultaneous filing. User may not have seen previous data.) Ortho Device/Splint Location: RLE Ortho Device/Splint Interventions: Ordered (Simultaneous filing. User may not have seen previous data.)   Post Interventions Patient Tolerated: Well Instructions Provided: Adjustment of device (Simultaneous filing. User may not have seen previous data.)  Kerry Fort 01/31/2021, 12:05 PM

## 2021-01-31 NOTE — Progress Notes (Deleted)
Orthopedic Tech Progress Note Patient Details:  Molly Dennis May 19, 1971 [id number] Prafo Boot was delivered to patinet's room  Ortho Devices Type of Ortho Device: Prafo boot/shoe Ortho Device/Splint Location: RLE Ortho Device/Splint Interventions: Ordered   Post Interventions Patient Tolerated: Well Instructions Provided: Adjustment of device  [name] E Teshawn Moan 01/31/2021, 12:03 PM

## 2021-02-01 LAB — GLUCOSE, CAPILLARY
Glucose-Capillary: 159 mg/dL — ABNORMAL HIGH (ref 70–99)
Glucose-Capillary: 135 mg/dL — ABNORMAL HIGH (ref 70–99)
Glucose-Capillary: 134 mg/dL — ABNORMAL HIGH (ref 70–99)
Glucose-Capillary: 139 mg/dL — ABNORMAL HIGH (ref 70–99)
Glucose-Capillary: 123 mg/dL — ABNORMAL HIGH (ref 70–99)

## 2021-02-01 LAB — BPAM RBC
Blood Product Expiration Date: 202207162359
Blood Product Expiration Date: 202207172359
ISSUE DATE / TIME: 202206290536
Unit Type and Rh: 600
Unit Type and Rh: 600

## 2021-02-01 LAB — TYPE AND SCREEN
ABO/RH(D): AB NEG
Antibody Screen: NEGATIVE
Unit division: 0
Unit division: 0

## 2021-02-01 LAB — POTASSIUM: Potassium: 3.5 mmol/L (ref 3.5–5.1)

## 2021-02-01 MED ORDER — POTASSIUM CHLORIDE 10 MEQ/100ML IV SOLN
10.0000 meq | INTRAVENOUS | Status: AC
  Administered 2021-02-01 (×4): 10 meq via INTRAVENOUS
  Filled 2021-02-01 (×4): qty 100

## 2021-02-01 NOTE — Plan of Care (Signed)
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
## 2021-02-02 LAB — GLUCOSE, CAPILLARY
Glucose-Capillary: 104 mg/dL — ABNORMAL HIGH (ref 70–99)
Glucose-Capillary: 108 mg/dL — ABNORMAL HIGH (ref 70–99)
Glucose-Capillary: 122 mg/dL — ABNORMAL HIGH (ref 70–99)
Glucose-Capillary: 125 mg/dL — ABNORMAL HIGH (ref 70–99)
Glucose-Capillary: 141 mg/dL — ABNORMAL HIGH (ref 70–99)
Glucose-Capillary: 145 mg/dL — ABNORMAL HIGH (ref 70–99)
Glucose-Capillary: 155 mg/dL — ABNORMAL HIGH (ref 70–99)

## 2021-02-02 LAB — BASIC METABOLIC PANEL
Anion gap: 8 (ref 5–15)
BUN: 18 mg/dL (ref 6–20)
CO2: 29 mmol/L (ref 22–32)
Calcium: 8.4 mg/dL — ABNORMAL LOW (ref 8.9–10.3)
Chloride: 103 mmol/L (ref 98–111)
Creatinine, Ser: 0.59 mg/dL (ref 0.44–1.00)
GFR, Estimated: >60 mL/min (ref >60)
Glucose, Bld: 145 mg/dL — ABNORMAL HIGH (ref 70–99)
Potassium: 3.7 mmol/L (ref 3.5–5.1)
Sodium: 140 mmol/L (ref 135–145)

## 2021-02-02 MED ORDER — LEVETIRACETAM 100 MG/ML PO SOLN
500.0000 mg | Freq: Every day | ORAL | Status: DC
  Administered 2021-02-03 – 2021-02-07 (×5): 500 mg
  Filled 2021-02-02 (×6): qty 5

## 2021-02-02 MED ORDER — LORAZEPAM 1 MG PO TABS: 1.0000 mg | ORAL_TABLET | ORAL | Status: DC | PRN
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
## 2021-02-03 LAB — GLUCOSE, CAPILLARY
Glucose-Capillary: 116 mg/dL — ABNORMAL HIGH (ref 70–99)
Glucose-Capillary: 149 mg/dL — ABNORMAL HIGH (ref 70–99)
Glucose-Capillary: 141 mg/dL — ABNORMAL HIGH (ref 70–99)
Glucose-Capillary: 123 mg/dL — ABNORMAL HIGH (ref 70–99)
Glucose-Capillary: 124 mg/dL — ABNORMAL HIGH (ref 70–99)
Glucose-Capillary: 126 mg/dL — ABNORMAL HIGH (ref 70–99)

## 2021-02-03 NOTE — Progress Notes (Signed)
7 Days Post-Op   Subjective/Chief Complaint: No overnight events.     Objective: Vital signs in last 24 hours: Temp:  [98 F (36.7 C)-98.6 F (37 C)] 98 F (36.7 C) (07/04 1126) Pulse Rate:  [98-114] 98 (07/04 1126) Resp:  [16-18] 16 (07/04 1126) BP: (100-119)/(58-82) 100/60 (07/04 1126) SpO2:  [91 %-96 %] 96 % (07/04 1126) Last BM Date: 02/01/21  Intake/Output from previous day: 07/03 0701 - 07/04 0700 In: 1053.7 [NG/GT:1053.7] Out: 900 [Urine:900] Intake/Output this shift: Total I/O In: 0  Out: 650 [Urine:650]  Physical Exam:  Gen: sleeping Neuro: non-focal exam, moving all extremities HEENT: PERRL, DHT in place Neck: supple CV: RRR Pulm: unlabored breathing Abd: soft, NT, ND Extr: wwp, +1 edema, dressings intact on RLE.   Lab Results:  No results for input(s): WBC, HGB, HCT, PLT in the last 72 hours.  BMET Recent Labs    02/01/21 1135 02/02/21 0248  NA  --  140  K 3.5 3.7  CL  --  103  CO2  --  29  GLUCOSE  --  145*  BUN  --  18  CREATININE  --  0.59  CALCIUM  --  8.4*   PT/INR No results for input(s): LABPROT, INR in the last 72 hours. ABG No results for input(s): PHART, HCO3 in the last 72 hours.  Invalid input(s): PCO2, PO2  Studies/Results: No results found.  Anti-infectives: Anti-infectives (From admission, onward)    Start     Dose/Rate Route Frequency Ordered Stop   01/28/21 0600  ceFAZolin (ANCEF) IVPB 2g/100 mL premix  Status:  Discontinued        2 g 200 mL/hr over 30 Minutes Intravenous On call to O.R. 01/27/21 1717 01/27/21 2253   01/28/21 0400  cefTRIAXone (ROCEPHIN) 2 g in sodium chloride 0.9 % 100 mL IVPB        2 g 200 mL/hr over 30 Minutes Intravenous Every 24 hours 01/27/21 2253 01/30/21 0447   01/27/21 1847  tobramycin (NEBCIN) powder  Status:  Discontinued          As needed 01/27/21 1847 01/27/21 2109   01/27/21 1847  vancomycin (VANCOCIN) powder  Status:  Discontinued          As needed 01/27/21 1848 01/27/21 2109    01/27/21 1643  ceFAZolin (ANCEF) 2-4 GM/100ML-% IVPB       Note to Pharmacy: Phebe Colla   : cabinet override      01/27/21 1643 01/28/21 0459   01/26/21 0530  cefTRIAXone (ROCEPHIN) 2 g in sodium chloride 0.9 % 100 mL IVPB  Status:  Discontinued        2 g 200 mL/hr over 30 Minutes Intravenous Every 24 hours 01/26/21 0437 01/27/21 2107   01/25/21 2015  ceFAZolin (ANCEF) IVPB 2g/100 mL premix        2 g 200 mL/hr over 30 Minutes Intravenous  Once 01/25/21 2009 01/25/21 2054       Assessment/Plan: [facility] 01/25/21   B scapula fx, L into glenoid - ortho c/s, Dr. Jena Gauss, nonop L clavicle fx - ortho c/s, Dr. Jena Gauss, nonop R acetabulum fx with small pelvic hematoma - ortho c/s, Dr. Jena Gauss, nonop R pubic rami fx - ortho c/s, Dr. Jena Gauss, nonop R open tib/fib fx and open tibial plateau fx - ortho c/s, Dr. Linna Caprice, s/p closed reduction and splinting 6/26, repeat I&D and ORIF of tibial plateau with IMN of tibia 6/27 by Dr. Jena Gauss R open calcaneus fx with degloving- ortho c/s,  Dr. Linna Caprice, s/p open reduction and splinting 6/26, s/p repeat I&D and ORIF 6/27 by Dr. Jena Gauss Psychiatric HX - home meds R great toe fx - ortho c/s, Dr. Jena Gauss, nonop ABL anemia - stable, recheck tomorrow. FEN - NPO Significant dysphagia and some of this may be from her previous stroke. Coretrack and tube feeds for now.  MBS tentatively planned by SLP tomorrow.   Hypokalemia - repleted 7/2, resolved 7/3 DVT - SCDs, LMWH Foley - external catheter  Dispo - 4NP, SNF. Will leave stepdown due to tachycardia    LOS: 9 days   Maudry Diego, MD FACS Surgical Oncology, General Surgery, Trauma and Critical [facility], [place] [phone] for weekday/non holidays Check [email or web address] for coverage night/weekend/holidays  Do not use SecureChat as it is not reliable for timely patient care.
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
## 2021-02-04 ENCOUNTER — Inpatient Hospital Stay (HOSPITAL_COMMUNITY): Payer: Medicare Other

## 2021-02-04 LAB — CBC
HCT: 28.1 % — ABNORMAL LOW (ref 36.0–46.0)
Hemoglobin: 9 g/dL — ABNORMAL LOW (ref 12.0–15.0)
MCH: 30.2 pg (ref 26.0–34.0)
MCHC: 32 g/dL (ref 30.0–36.0)
MCV: 94.3 fL (ref 80.0–100.0)
Platelets: 497 10*3/uL — ABNORMAL HIGH (ref 150–400)
RBC: 2.98 MIL/uL — ABNORMAL LOW (ref 3.87–5.11)
RDW: 16.3 % — ABNORMAL HIGH (ref 11.5–15.5)
WBC: 12.8 10*3/uL — ABNORMAL HIGH (ref 4.0–10.5)
nRBC: 0.2 % (ref 0.0–0.2)

## 2021-02-04 LAB — GLUCOSE, CAPILLARY
Glucose-Capillary: 148 mg/dL — ABNORMAL HIGH (ref 70–99)
Glucose-Capillary: 145 mg/dL — ABNORMAL HIGH (ref 70–99)
Glucose-Capillary: 102 mg/dL — ABNORMAL HIGH (ref 70–99)
Glucose-Capillary: 97 mg/dL (ref 70–99)
Glucose-Capillary: 118 mg/dL — ABNORMAL HIGH (ref 70–99)
Glucose-Capillary: 100 mg/dL — ABNORMAL HIGH (ref 70–99)

## 2021-02-04 NOTE — Progress Notes (Signed)
Patient tolerated approximately 30 percent of lunch and 180 ML of honey thick sweet tea. She is sitting upright, able to follow command and clear throat after each bite/sip. No other distress noted. Will continue to monitor.

## 2021-02-04 NOTE — Progress Notes (Signed)
Modified Barium Swallow Progress Note  Patient Details  Name: Chenee Munns MRN: [id number] Date of Birth: 09-26-1970  Today's Date: 02/04/2021  Modified Barium Swallow completed.  Full report located under Chart Review in the Imaging Section.  Brief recommendations include the following:  Clinical Impression  Per bedside swallow history, pt has dysphagia from remote stroke with occasional difficulty. Therapist suspects effects from COPD may be impacting dysphagia as well. Her cough is weak and pt states painful on attempts. Mastication was delayed with soft cereal bar texture and minimal lingual residue appreciated with most textures. Pharyngeal phase marked by mistimed closure of larynx allowing penetration before and during the swallow of nectar barium with PAS (3,5) with reduced senation. Separate trials of breath hold prior to oral transit and chin tuck were ineffective to provide timely and full closure. No consistent residue noted. Texture of Dys 2, honey thick liquids, throat clear intermittently, crush meds is recommended. ST will continue for intervention with strategies and progression to higher textures.   Swallow Evaluation Recommendations       SLP Diet Recommendations: Honey thick liquids;Dysphagia 2 (Fine chop) solids   Liquid Administration via: Cup;No straw   Medication Administration: Crushed with puree   Supervision: Full assist for feeding;Staff to assist with self feeding   Compensations: Minimize environmental distractions;Slow rate;Small sips/bites;Clear throat intermittently   Postural Changes: Seated upright at 90 degrees   Oral Care Recommendations: Oral care BID        Royce Macadamia 02/04/2021,1:11 PM  Breck Coons [place].Ed Nurse, children's [phone] Office [phone]

## 2021-02-04 NOTE — Progress Notes (Signed)
Trauma/Critical Care Follow Up Note  Subjective:    Overnight Issues:   Objective:  Vital signs for last 24 hours: Temp:  [98 F (36.7 C)-100 F (37.8 C)] 98.3 F (36.8 C) (07/05 0754) Pulse Rate:  [92-105] 97 (07/05 1200) Resp:  [12-17] 14 (07/05 1200) BP: (107-131)/(65-85) 107/71 (07/05 0955) SpO2:  [90 %-94 %] 93 % (07/05 1200)  Hemodynamic parameters for last 24 hours:    Intake/Output from previous day: 07/04 0701 - 07/05 0700 In: 0  Out: 1950 [Urine:1950]  Intake/Output this shift: Total I/O In: -  Out: 400 [Urine:400]  Vent settings for last 24 hours:    Physical Exam:  Gen: comfortable, no distress Neuro: non-focal exam HEENT: PERRL Neck: supple CV: RRR Pulm: unlabored breathing Abd: soft, NT GU: clear yellow urine Extr: wwp, no edema, moves toes   Results for orders placed or performed during the hospital encounter of 01/25/21 (from the past 24 hour(s))  Glucose, capillary     Status: Abnormal   Collection Time: 02/03/21  4:16 PM  Result Value Ref Range   Glucose-Capillary 123 (H) 70 - 99 mg/dL  Glucose, capillary     Status: Abnormal   Collection Time: 02/03/21  7:28 PM  Result Value Ref Range   Glucose-Capillary 124 (H) 70 - 99 mg/dL  Glucose, capillary     Status: Abnormal   Collection Time: 02/03/21 11:17 PM  Result Value Ref Range   Glucose-Capillary 126 (H) 70 - 99 mg/dL  CBC     Status: Abnormal   Collection Time: 02/04/21  1:04 AM  Result Value Ref Range   WBC 12.8 (H) 4.0 - 10.5 K/uL   RBC 2.98 (L) 3.87 - 5.11 MIL/uL   Hemoglobin 9.0 (L) 12.0 - 15.0 g/dL   HCT 16.1 (L) 09.6 - 04.5 %   MCV 94.3 80.0 - 100.0 fL   MCH 30.2 26.0 - 34.0 pg   MCHC 32.0 30.0 - 36.0 g/dL   RDW 40.9 (H) 81.1 - 91.4 %   Platelets 497 (H) 150 - 400 K/uL   nRBC 0.2 0.0 - 0.2 %  Glucose, capillary     Status: Abnormal   Collection Time: 02/04/21  3:14 AM  Result Value Ref Range   Glucose-Capillary 148 (H) 70 - 99 mg/dL  Glucose, capillary     Status:  Abnormal   Collection Time: 02/04/21  7:51 AM  Result Value Ref Range   Glucose-Capillary 145 (H) 70 - 99 mg/dL  Glucose, capillary     Status: Abnormal   Collection Time: 02/04/21 12:06 PM  Result Value Ref Range   Glucose-Capillary 102 (H) 70 - 99 mg/dL    Assessment & Plan:  Present on Admission:  Acetabular fracture (HCC)    LOS: 10 days   Additional comments:I reviewed the patient's new clinical lab test results.   and I reviewed the patients new imaging test results.    [facility] 01/25/21   B scapula fx, L into glenoid - ortho c/s, Dr. Jena Gauss, nonop L clavicle fx - ortho c/s, Dr. Jena Gauss, nonop R acetabulum fx with small pelvic hematoma - ortho c/s, Dr. Jena Gauss, nonop R pubic rami fx - ortho c/s, Dr. Jena Gauss, nonop R open tib/fib fx and open tibial plateau fx - ortho c/s, Dr. Linna Caprice, s/p closed reduction and splinting 6/26, repeat I&D and ORIF of tibial plateau with IMN of tibia 6/27 by Dr. Jena Gauss R open calcaneus fx with degloving- ortho c/s, Dr. Linna Caprice, s/p open reduction and splinting 6/26,  s/p repeat I&D and ORIF 6/27 by Dr. Jena Gauss Psychiatric HX - home meds R great toe fx - ortho c/s, Dr. Jena Gauss, nonop ABL anemia - stable FEN - upgraded to D2 diet, leave cortrak and continue nocturnal TF until PO intake deemed adequate   DVT - SCDs, LMWH Foley - external catheter Dispo - medsurg, SNF    Diamantina Monks, MD Trauma & General Surgery Please use [email or web address] to contact on call provider  02/04/2021  *Care during the described time interval was provided by me. I have reviewed this patient's available data, including medical history, events of note, physical examination and test results as part of my evaluation.

## 2021-02-04 NOTE — Progress Notes (Signed)
Patient prepared for swallow eval with SLP today. Oral care provided, repositioned, TF stopped. VSS. Patient denied any pain but voices that dobhoff tube is making her cough. Provide reassurance and support.

## 2021-02-04 NOTE — Progress Notes (Signed)
Patient returned from swallow eval with an upgraded diet. Patient tolerated  honey thick oral fluid w/o any distress. Per SLP, hold TF at this time and monitor her progression.

## 2021-02-05 LAB — GLUCOSE, CAPILLARY
Glucose-Capillary: 105 mg/dL — ABNORMAL HIGH (ref 70–99)
Glucose-Capillary: 99 mg/dL (ref 70–99)
Glucose-Capillary: 122 mg/dL — ABNORMAL HIGH (ref 70–99)
Glucose-Capillary: 106 mg/dL — ABNORMAL HIGH (ref 70–99)
Glucose-Capillary: 120 mg/dL — ABNORMAL HIGH (ref 70–99)

## 2021-02-05 MED ORDER — PANTOPRAZOLE SODIUM 40 MG PO TBEC
40.0000 mg | DELAYED_RELEASE_TABLET | Freq: Every day | ORAL | Status: DC
  Administered 2021-02-05 – 2021-02-11 (×7): 40 mg via ORAL
  Filled 2021-02-05 (×7): qty 1

## 2021-02-05 MED ORDER — NICOTINE 14 MG/24HR TD PT24
14.0000 mg | MEDICATED_PATCH | Freq: Every day | TRANSDERMAL | Status: DC
  Administered 2021-02-05 – 2021-02-11 (×7): 14 mg via TRANSDERMAL
  Filled 2021-02-05 (×7): qty 1

## 2021-02-05 MED ORDER — ENOXAPARIN SODIUM 30 MG/0.3ML IJ SOSY
30.0000 mg | PREFILLED_SYRINGE | Freq: Two times a day (BID) | INTRAMUSCULAR | Status: DC
  Administered 2021-02-05 – 2021-02-11 (×14): 30 mg via SUBCUTANEOUS
  Filled 2021-02-05 (×15): qty 0.3

## 2021-02-05 MED ORDER — PIVOT 1.5 CAL PO LIQD
1000.0000 mL | ORAL | Status: DC
  Administered 2021-02-05 – 2021-02-06 (×2): 1000 mL
  Filled 2021-02-05 (×2): qty 1000

## 2021-02-05 NOTE — Progress Notes (Signed)
   Trauma/Critical Care Follow Up Note  Subjective:    Overnight Issues: No acute changes.  Objective:  Vital signs for last 24 hours: Temp:  [97.8 F (36.6 C)-98.6 F (37 C)] 97.8 F (36.6 C) (07/06 0743) Pulse Rate:  [92-103] 97 (07/06 0743) Resp:  [13-20] 16 (07/06 0743) BP: (103-122)/(65-87) 103/65 (07/06 0743) SpO2:  [90 %-94 %] 94 % (07/06 0743)  Hemodynamic parameters for last 24 hours:    Intake/Output from previous day: 07/05 0701 - 07/06 0700 In: 1486 [P.O.:540; NG/GT:946] Out: 2600 [Urine:2600]  Intake/Output this shift: No intake/output data recorded.   Physical Exam:  Gen: comfortable, no distress Neuro: non-focal exam HEENT: PERRL, cortrak in place Neck: supple CV: RRR Pulm: unlabored breathing Abd: soft, nontender, nondistended Extr: wwp, no edema, moves toes, splint in place LLE   Results for orders placed or performed during the hospital encounter of 01/25/21 (from the past 24 hour(s))  Glucose, capillary     Status: Abnormal   Collection Time: 02/04/21 12:06 PM  Result Value Ref Range   Glucose-Capillary 102 (H) 70 - 99 mg/dL  Glucose, capillary     Status: None   Collection Time: 02/04/21  4:10 PM  Result Value Ref Range   Glucose-Capillary 97 70 - 99 mg/dL  Glucose, capillary     Status: Abnormal   Collection Time: 02/04/21  7:09 PM  Result Value Ref Range   Glucose-Capillary 118 (H) 70 - 99 mg/dL  Glucose, capillary     Status: Abnormal   Collection Time: 02/04/21 11:02 PM  Result Value Ref Range   Glucose-Capillary 100 (H) 70 - 99 mg/dL  Glucose, capillary     Status: Abnormal   Collection Time: 02/05/21  3:25 AM  Result Value Ref Range   Glucose-Capillary 105 (H) 70 - 99 mg/dL  Glucose, capillary     Status: None   Collection Time: 02/05/21  7:42 AM  Result Value Ref Range   Glucose-Capillary 99 70 - 99 mg/dL    Assessment & Plan:  Present on Admission:  Acetabular fracture (HCC)    LOS: 11 days   Additional comments:I  reviewed the patient's new clinical lab test results.   and I reviewed the patients new imaging test results.    [facility] 01/25/21   B scapula fx, L into glenoid - ortho c/s, Dr. Jena Gauss, nonop L clavicle fx - ortho c/s, Dr. Jena Gauss, nonop R acetabulum fx with small pelvic hematoma - ortho c/s, Dr. Jena Gauss, nonop R pubic rami fx - ortho c/s, Dr. Jena Gauss, nonop R open tib/fib fx and open tibial plateau fx - ortho c/s, Dr. Linna Caprice, s/p closed reduction and splinting 6/26, repeat I&D and ORIF of tibial plateau with IMN of tibia 6/27 by Dr. Jena Gauss R open calcaneus fx with degloving- ortho c/s, Dr. Linna Caprice, s/p open reduction and splinting 6/26, s/p repeat I&D and ORIF 6/27 by Dr. Jena Gauss Psychiatric HX - home meds R great toe fx - ortho c/s, Dr. Jena Gauss, nonop ABL anemia - stable FEN - D2 diet, continue nocturnal tube feeds until PO intake is adequate DVT - SCDs, LMWH Foley - external catheter Dispo - medsurg, SNF    Sophronia Simas, MD [facility] Surgery General, Hepatobiliary and Pancreatic Surgery 02/05/21 9:08 AM
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
## 2021-02-05 NOTE — TOC Progression Note (Signed)
Transition of Care [facility]) - Progression Note    Patient Details  Name: Molly Dennis MRN: [id number] Date of Birth: 1971-07-25  [facility] of [facility] CM/SW Contact  Glennon Mac, RN Phone Number: 02/05/2021, 4:11 PM  Clinical Narrative:   Spoke with patient regarding skilled nursing facility placement for rehab.  She is agreeable to SNF placement, but asked that I call her mother and sister to discuss this.  Spoke with patient's sister, [name], and emailed SNF bed offers to her.  Email: [email or web address]. Appears patient has started diet, but appetite has been poor. Will follow-up with updates and family's bed choice.  Anticipate discharge to SNF when p.o. intake is increased.    Expected Discharge Plan: Skilled Nursing Facility Barriers to Discharge: SNF Pending bed offer  Expected Discharge Plan and Services Expected Discharge Plan: Skilled Nursing Facility     Post Acute Care Choice: Skilled Nursing Facility                                         Social Determinants of Health (SDOH) Interventions    Readmission Risk Interventions No flowsheet data found.  Quintella Baton, RN, BSN  Trauma/Neuro ICU Case Manager [phone]
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
## 2021-02-05 NOTE — Progress Notes (Signed)
Orthopaedic Trauma Progress Note  SUBJECTIVE: Doing ok this morning. Notes throbbing in her right foot and ankle but this is stable. Wondering if her mom or sister are coming to the hospital today.   OBJECTIVE:  Vitals:   02/05/21 0323 02/05/21 0743  BP: 117/73 103/65  Pulse: 99 97  Resp: 20 16  Temp: 98.6 F (37 C) 97.8 F (36.6 C)  SpO2: 92% 94%    General: Sitting up in bed, NAD.  RLE: PRAFO boot in place. Dressings and wound vac removed. Incisions/lacerations as pictured below.  New dressings applied.  Able to wiggle toes. Endorses sensation to light touch throughout extremity. Toes warm and well perfused Calcaneus:     Medial ankle:   Lateral knee incision:   Medial knee laceration:   IMAGING: Stable post op imaging.   LABS:  Results for orders placed or performed during the hospital encounter of 01/25/21 (from the past 24 hour(s))  Glucose, capillary     Status: Abnormal   Collection Time: 02/04/21 12:06 PM  Result Value Ref Range   Glucose-Capillary 102 (H) 70 - 99 mg/dL  Glucose, capillary     Status: None   Collection Time: 02/04/21  4:10 PM  Result Value Ref Range   Glucose-Capillary 97 70 - 99 mg/dL  Glucose, capillary     Status: Abnormal   Collection Time: 02/04/21  7:09 PM  Result Value Ref Range   Glucose-Capillary 118 (H) 70 - 99 mg/dL  Glucose, capillary     Status: Abnormal   Collection Time: 02/04/21 11:02 PM  Result Value Ref Range   Glucose-Capillary 100 (H) 70 - 99 mg/dL  Glucose, capillary     Status: Abnormal   Collection Time: 02/05/21  3:25 AM  Result Value Ref Range   Glucose-Capillary 105 (H) 70 - 99 mg/dL  Glucose, capillary     Status: None   Collection Time: 02/05/21  7:42 AM  Result Value Ref Range   Glucose-Capillary 99 70 - 99 mg/dL    ASSESSMENT: Molly Dennis is a 50 y.o. female, 9 Days Post-Op s/p  ORIF RIGHT TIBIAL PLATEAU INTRAMEDULLARY NAIL RIGHT TIBIAL ORIF RIGHT CALCANEOUS FRACTURE NON-OPERATIVE MANAGEMENT LEFT  CLAVICLE  NON-OPERATIVE MANAGEMENT B/L SCAPULA  NON-OPERATIVE MANAGEMENT R acetabulum AND R PUBIC RAMI NON-OPERATIVE MANAGEMENT R GREAT TOE  CV/Blood loss: Hgb 9.0 on 02/04/21, stable  PLAN: Weightbearing: NWB RLE ROM: OK for ankle and knee ROM as tolerated Incisional and dressing care: Reinforce dressing to right ankle and foot PRN. Change knee dressing PRN. Ok to leave those areas open to air if no drainage Showering: Hold off on showering, ok for bed bath Orthopedic device(s): PRAFO RLE  Pain management: per trauma VTE prophylaxis:  Loevnox once Hgb stable , SCDs ID: Ceftriaxone for open fracture completed Foley/Lines: No foley. KVO IVFs Impediments to Fracture Healing: Vit D level 25, continue D3 supplementation Dispo: PT/OT as able. We will continue to monitor soft tissue healing on right foot and ortho will change dressings as needed. Plan to treat L clavicle, B/L scapula, R acetabulum, R pubic rami, R great toe fractures all non-operatively.   Follow - up plan: 2 weeks after d/c  Contact information:  Truitt Merle MD, Ulyses Southward PA-C. After hours and holidays please check [email or web address] for group call information for [facility]   Hailei Besser A. Michaelyn Barter, PA-C [phone] (office) [email or web address]

## 2021-02-06 LAB — GLUCOSE, CAPILLARY
Glucose-Capillary: 145 mg/dL — ABNORMAL HIGH (ref 70–99)
Glucose-Capillary: 125 mg/dL — ABNORMAL HIGH (ref 70–99)
Glucose-Capillary: 134 mg/dL — ABNORMAL HIGH (ref 70–99)
Glucose-Capillary: 166 mg/dL — ABNORMAL HIGH (ref 70–99)
Glucose-Capillary: 142 mg/dL — ABNORMAL HIGH (ref 70–99)
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
## 2021-02-06 NOTE — TOC Progression Note (Signed)
Transition of Care [facility]) - Progression Note    Patient Details  Name: Molly Dennis MRN: [id number] Date of Birth: 27-Feb-1971  Transition of Care [facility]) CM/SW Contact  Glennon Mac, RN Phone Number: 02/06/2021, 4:23 PM  Clinical Narrative:   FL 2 updated.  Spoke with patient's sister, [name], regarding bed offers: She states family desires [facility] for placement.  Notified Teresa at [employer] of bed acceptance.  Family aware that patient will be discharged when p.o. intake improves, and patient does not require tube feedings any longer.  Will initiate insurance authorization upon discontinuing tube feeds.   Expected Discharge Plan: Skilled Nursing Facility Barriers to Discharge: SNF Pending bed offer  Expected Discharge Plan and Services Expected Discharge Plan: Skilled Nursing Facility     Post Acute Care Choice: Skilled Nursing Facility                                         Social Determinants of Health (SDOH) Interventions    Readmission Risk Interventions No flowsheet data found.  Quintella Baton, RN, BSN  Trauma/Neuro ICU Case Manager [phone]
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
## 2021-02-07 LAB — GLUCOSE, CAPILLARY
Glucose-Capillary: 130 mg/dL — ABNORMAL HIGH (ref 70–99)
Glucose-Capillary: 109 mg/dL — ABNORMAL HIGH (ref 70–99)

## 2021-02-07 MED ORDER — OXYCODONE HCL 5 MG/5ML PO SOLN
10.0000 mg | ORAL | Status: DC | PRN
  Administered 2021-02-07 – 2021-02-11 (×10): 10 mg via ORAL
  Filled 2021-02-07 (×10): qty 10

## 2021-02-07 MED ORDER — FOOD THICKENER (SIMPLYTHICK HONEY)
1.0000 | ORAL | Status: DC | PRN
  Filled 2021-02-07 (×2): qty 1

## 2021-02-07 MED ORDER — GABAPENTIN 300 MG PO CAPS
300.0000 mg | ORAL_CAPSULE | Freq: Three times a day (TID) | ORAL | Status: DC
  Administered 2021-02-07 – 2021-02-11 (×15): 300 mg via ORAL
  Filled 2021-02-07 (×15): qty 1

## 2021-02-07 MED ORDER — ENSURE ENLIVE PO LIQD
237.0000 mL | Freq: Three times a day (TID) | ORAL | Status: DC
  Administered 2021-02-07 – 2021-02-11 (×12): 237 mL via ORAL

## 2021-02-07 MED ORDER — LEVETIRACETAM 100 MG/ML PO SOLN
500.0000 mg | Freq: Every day | ORAL | Status: DC
  Administered 2021-02-07 – 2021-02-11 (×5): 500 mg via ORAL
  Filled 2021-02-07 (×5): qty 5

## 2021-02-07 MED ORDER — ATORVASTATIN CALCIUM 40 MG PO TABS
40.0000 mg | ORAL_TABLET | Freq: Every day | ORAL | Status: DC
  Administered 2021-02-07 – 2021-02-11 (×5): 40 mg via ORAL
  Filled 2021-02-07 (×5): qty 1

## 2021-02-07 MED ORDER — GUAIFENESIN 100 MG/5ML PO SOLN
10.0000 mL | ORAL | Status: DC
  Administered 2021-02-07 – 2021-02-11 (×10): 200 mg via ORAL
  Filled 2021-02-07 (×30): qty 10

## 2021-02-07 MED ORDER — QUETIAPINE FUMARATE 200 MG PO TABS
400.0000 mg | ORAL_TABLET | Freq: Two times a day (BID) | ORAL | Status: DC
  Administered 2021-02-07 – 2021-02-11 (×10): 400 mg via ORAL
  Filled 2021-02-07: qty 8
  Filled 2021-02-07: qty 2
  Filled 2021-02-07: qty 8
  Filled 2021-02-07: qty 2
  Filled 2021-02-07: qty 8
  Filled 2021-02-07 (×2): qty 2
  Filled 2021-02-07: qty 8
  Filled 2021-02-07: qty 2
  Filled 2021-02-07: qty 8
  Filled 2021-02-07: qty 2
  Filled 2021-02-07: qty 8
  Filled 2021-02-07 (×3): qty 2
  Filled 2021-02-07 (×2): qty 8
  Filled 2021-02-07: qty 2

## 2021-02-07 MED ORDER — ADULT MULTIVITAMIN W/MINERALS CH
1.0000 | ORAL_TABLET | Freq: Every day | ORAL | Status: DC
  Administered 2021-02-07 – 2021-02-11 (×5): 1 via ORAL
  Filled 2021-02-07 (×7): qty 1

## 2021-02-07 MED ORDER — ACETAMINOPHEN 160 MG/5ML PO SOLN: 650.0000 mg | Freq: Four times a day (QID) | ORAL | Status: DC | PRN

## 2021-02-07 MED ORDER — DOCUSATE SODIUM 50 MG/5ML PO LIQD
100.0000 mg | Freq: Two times a day (BID) | ORAL | Status: DC
  Administered 2021-02-07 – 2021-02-11 (×9): 100 mg via ORAL
  Filled 2021-02-07 (×10): qty 10

## 2021-02-07 MED ORDER — POLYETHYLENE GLYCOL 3350 17 G PO PACK: 17.0000 g | PACK | Freq: Every day | ORAL | Status: DC | PRN

## 2021-02-07 MED ORDER — ENSURE ENLIVE PO LIQD
237.0000 mL | Freq: Two times a day (BID) | ORAL | Status: DC
  Administered 2021-02-07: 237 mL via ORAL
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
## 2021-02-07 NOTE — Plan of Care (Signed)

## 2021-02-07 NOTE — Progress Notes (Signed)
Trauma/Critical Care Follow Up Note  Subjective:    No new events overnight.  Eating some.  Hates Cortrak  Objective:  Vital signs for last 24 hours: Temp:  [97.4 F (36.3 C)-98.7 F (37.1 C)] 97.9 F (36.6 C) (07/08 0816) Pulse Rate:  [88-100] 90 (07/08 0816) Resp:  [16-19] 18 (07/08 0816) BP: (91-144)/(44-78) 99/61 (07/08 0816) SpO2:  [76 %-98 %] 96 % (07/08 0816) Weight:  [86.1 kg] 86.1 kg (07/08 0424)  Hemodynamic parameters for last 24 hours:    Intake/Output from previous day: 07/07 0701 - 07/08 0700 In: 480 [P.O.:480] Out: 600 [Urine:600]  Intake/Output this shift: No intake/output data recorded.  Vent settings for last 24 hours:    Physical Exam:  Gen: comfortable, no distress Neuro: non-focal exam HEENT: PERRL, Cortrak in place Neck: supple CV: RRR Pulm: unlabored breathing Abd: soft, NT GU: clear yellow urine Extr: Moon boot and splint on RLE.  Sensation to right foot present but decreased, can wiggle toes.   Results for orders placed or performed during the hospital encounter of 01/25/21 (from the past 24 hour(s))  Glucose, capillary     Status: Abnormal   Collection Time: 02/06/21 11:36 AM  Result Value Ref Range   Glucose-Capillary 134 (H) 70 - 99 mg/dL  Glucose, capillary     Status: Abnormal   Collection Time: 02/06/21  3:40 PM  Result Value Ref Range   Glucose-Capillary 166 (H) 70 - 99 mg/dL  Glucose, capillary     Status: Abnormal   Collection Time: 02/06/21  8:04 PM  Result Value Ref Range   Glucose-Capillary 142 (H) 70 - 99 mg/dL  Glucose, capillary     Status: Abnormal   Collection Time: 02/07/21  4:20 AM  Result Value Ref Range   Glucose-Capillary 130 (H) 70 - 99 mg/dL  Glucose, capillary     Status: Abnormal   Collection Time: 02/07/21  8:10 AM  Result Value Ref Range   Glucose-Capillary 109 (H) 70 - 99 mg/dL    Assessment & Plan: The plan of care was discussed with the bedside nurse for the day, Melissa, who is in agreement  with this plan and no additional concerns were raised.   Present on Admission:  Acetabular fracture (HCC)    LOS: 13 days   Additional comments:I reviewed the patient's new clinical lab test results.   and I reviewed the patients new imaging test results.    [facility]    B scapula fx, L into glenoid - ortho c/s, Dr. Jena Gauss, nonop L clavicle fx - ortho c/s, Dr. Jena Gauss, nonop R acetabulum fx with small pelvic hematoma - ortho c/s, Dr. Jena Gauss, nonop R pubic rami fx - ortho c/s, Dr. Jena Gauss, nonop R open tib/fib fx and open tibial plateau fx - ortho c/s, Dr. Linna Caprice, s/p closed reduction and splinting 6/26, repeat I&D and ORIF of tibial plateau with IMN of tibia 6/27 by Dr. Jena Gauss R open calcaneus fx with degloving- ortho c/s, Dr. Linna Caprice, s/p open reduction and splinting 6/26, s/p repeat I&D and ORIF 6/27 by Dr. Jena Gauss Psychiatric HX - home meds R great toe fx - ortho c/s, Dr. Jena Gauss, nonop ABL anemia - stable FEN/dysphagia - D2 diet, DC Cortrak today and let her eat.  If she does well, SNF early next week DVT - SCDs, LMWH Foley - external catheter Dispo - medsurg, SNF hopefully early next week   Letha Cape, PA-C Trauma & General Surgery Please use [email or web address] to contact on call provider  02/07/2021      

## 2021-02-07 NOTE — Progress Notes (Signed)
Pt c/o of bladder pain 10/10. Unable to urinate. Bladder scan showed . Made Dr. Bedelia Person aware. Order to in and out with the foley and if urine output is more than to leave it in. Urine output is . Keeping the foley catheter in. Will continue to monitor pt.
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
## 2021-02-07 NOTE — Progress Notes (Signed)
Patient received to room 6N29 from PCU. Will continue to monitor.

## 2021-02-08 LAB — GLUCOSE, CAPILLARY: Glucose-Capillary: 97 mg/dL (ref 70–99)

## 2021-02-08 MED ORDER — TAMSULOSIN HCL 0.4 MG PO CAPS
0.4000 mg | ORAL_CAPSULE | Freq: Every day | ORAL | Status: DC
  Administered 2021-02-08 – 2021-02-11 (×4): 0.4 mg via ORAL
  Filled 2021-02-08 (×4): qty 1

## 2021-02-08 NOTE — Progress Notes (Signed)
   Trauma/Critical Care Follow Up Note  Subjective:    Eating some with Cortrak out.  Apparently had urinary retention overnight.  Foley placed due to 800cc in bladder  Objective:  Vital signs for last 24 hours: Temp:  [97.6 F (36.4 C)-98.7 F (37.1 C)] 97.8 F (36.6 C) (07/09 0803) Pulse Rate:  [78-98] 78 (07/09 0803) Resp:  [14-20] 16 (07/09 0803) BP: (97-116)/(60-75) 97/66 (07/09 0803) SpO2:  [94 %-100 %] 95 % (07/09 0803)  Hemodynamic parameters for last 24 hours:    Intake/Output from previous day: 07/08 0701 - 07/09 0700 In: 240 [P.O.:240] Out: 2450 [Urine:2450]  Intake/Output this shift: No intake/output data recorded.  Vent settings for last 24 hours:    Physical Exam:  Gen: comfortable, no distress Neuro: non-focal exam HEENT: PERRL Neck: supple CV: RRR Pulm: unlabored breathing Abd: soft, NT GU: clear yellow urine Extr: Moon boot and splint on RLE.  Sensation to right foot present but decreased, can wiggle toes.   No results found for this or any previous visit (from the past 24 hour(s)).   Assessment & Plan:   LOS: 14 days   [facility]    B scapula fx, L into glenoid - ortho c/s, Dr. Jena Gauss, nonop L clavicle fx - ortho c/s, Dr. Jena Gauss, nonop R acetabulum fx with small pelvic hematoma - ortho c/s, Dr. Jena Gauss, nonop R pubic rami fx - ortho c/s, Dr. Jena Gauss, nonop R open tib/fib fx and open tibial plateau fx - ortho c/s, Dr. Linna Caprice, s/p closed reduction and splinting 6/26, repeat I&D and ORIF of tibial plateau with IMN of tibia 6/27 by Dr. Jena Gauss R open calcaneus fx with degloving- ortho c/s, Dr. Linna Caprice, s/p open reduction and splinting 6/26, s/p repeat I&D and ORIF 6/27 by Dr. Jena Gauss Psychiatric HX - home meds R great toe fx - ortho c/s, Dr. Jena Gauss, nonop ABL anemia - stable FEN/dysphagia - D2 diet DVT - SCDs, LMWH Foley/Urinary retention - insert on 7/8, flomax Dispo - medsurg, SNF hopefully early next week   Letha Cape, PA-C Trauma &  General Surgery Please use [email or web address] to contact on call provider  02/08/2021
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
## 2021-02-10 LAB — RESP PANEL BY RT-PCR (FLU A&B, COVID) ARPGX2
Influenza A by PCR: NEGATIVE
Influenza B by PCR: NEGATIVE
SARS Coronavirus 2 by RT PCR: NEGATIVE

## 2021-02-10 MED ORDER — ATOMOXETINE HCL 10 MG PO CAPS
20.0000 mg | ORAL_CAPSULE | Freq: Every morning | ORAL | Status: DC
  Administered 2021-02-10 – 2021-02-11 (×2): 20 mg via ORAL
  Filled 2021-02-10 (×2): qty 2

## 2021-02-10 MED ORDER — ATOMOXETINE HCL 40 MG PO CAPS
40.0000 mg | ORAL_CAPSULE | Freq: Every morning | ORAL | Status: DC
  Administered 2021-02-10 – 2021-02-11 (×2): 40 mg via ORAL
  Filled 2021-02-10 (×2): qty 1
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
## 2021-02-10 NOTE — TOC Progression Note (Signed)
Transition of Care [facility]) - Progression Note    Patient Details  Name: Molly Dennis MRN: [id number] Date of Birth: June 28, 1971  [facility]) CM/SW Contact  Glennon Mac, RN Phone Number: 02/10/2021, 1230 Clinical Narrative:   Pt medically stable for discharge to SNF.  Submitted for insurance authorization through [employer], reference [id number].  Spoke with Rosey Bath at [facility]; she has bed available upon insurance auth.  Will follow with updates as they are available.    Expected Discharge Plan: Skilled Nursing Facility Barriers to Discharge: SNF Pending bed offer  Expected Discharge Plan and Services Expected Discharge Plan: Skilled Nursing Facility     Post Acute Care Choice: Skilled Nursing Facility                                         Social Determinants of Health (SDOH) Interventions    Readmission Risk Interventions No flowsheet data found.  Quintella Baton, RN, BSN  Trauma/Neuro ICU Case Manager [phone]

## 2021-02-11 ENCOUNTER — Inpatient Hospital Stay (HOSPITAL_COMMUNITY): Payer: Medicare Other

## 2021-02-11 MED ORDER — OXYCODONE HCL 5 MG/5ML PO SOLN: 5.0000 mg | ORAL | 0 refills | Status: DC | PRN

## 2021-02-11 MED ORDER — TAMSULOSIN HCL 0.4 MG PO CAPS: 0.4000 mg | ORAL_CAPSULE | Freq: Every day | ORAL | Status: DC

## 2021-02-11 MED ORDER — GUAIFENESIN 100 MG/5ML PO SOLN: 10.0000 mL | ORAL | 0 refills | Status: DC | PRN

## 2021-02-11 MED ORDER — VITAMIN D-3 5000 UNIT/ML SL LIQD: 1.0000 | Freq: Every day | SUBLINGUAL | Status: DC

## 2021-02-11 MED ORDER — FOOD THICKENER (SIMPLYTHICK HONEY): 1.0000 | ORAL | Status: DC | PRN
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
## 2021-02-11 NOTE — Discharge Instructions (Signed)
Weightbearing: NWB RLE ROM: OK for ankle and knee ROM as tolerated Incisional and dressing care: Reinforce dressing to right ankle and foot PRN. Change knee dressing PRN. Ok to leave those areas open to air if no drainage Showering: Hold off on showering, ok for bed bath Orthopedic device(s): PRAFO RLE  Follow - up plan: 2 weeks after d/c

## 2021-02-11 NOTE — Progress Notes (Signed)
Trial to get report to [facility] failed will keep trying

## 2021-02-11 NOTE — TOC Progression Note (Addendum)
Transition of Care [facility]) - Progression Note    Patient Details  Name: Molly Dennis MRN: [id number] Date of Birth: May 07, 1971  [facility]) CM/SW Contact  Glennon Mac, RN Phone Number: 02/11/2021, 1108  Clinical Narrative:   Authorization received from [facility] Medicare for SNF.  Authorization # is [id number]; valid 02/10/21 through 02/13/21.  Notified Teressa at [facility] and discharge summary sent to facility. She will call back with room number and phone number to call report.   Plan for discharge to [facility] today, room 106.  Bedside nurse will need to call report to [phone].  Room ready after 3pm; will plan to call [facility] around 2pm for transport.   1430 pm [facility] notified for transport.    Expected Discharge Plan: Skilled Nursing Facility Barriers to Discharge: SNF Pending bed offer  Expected Discharge Plan and Services Expected Discharge Plan: Skilled Nursing Facility     Post Acute Care Choice: Skilled Nursing Facility   Expected Discharge Date: 02/11/21                                     Social Determinants of Health (SDOH) Interventions    Readmission Risk Interventions No flowsheet data found.  Quintella Baton, RN, BSN  Trauma/Neuro ICU Case Manager [phone]

## 2021-02-11 NOTE — Plan of Care (Signed)
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
## 2021-02-11 NOTE — Progress Notes (Signed)
Orthopaedic Trauma Progress Note  SUBJECTIVE: Doing ok this morning. Notes throbbing in her right foot and ankle but this is stable. Wondering if her mom or sister are coming to the hospital today.   OBJECTIVE:  Vitals:   02/11/21 0344 02/11/21 0857  BP: 101/67 98/76  Pulse: 95 90  Resp: 16 18  Temp: 98.7 F (37.1 C) 98.1 F (36.7 C)  SpO2: 95% 99%    General: Resting in bed, NAD.  RLE: Dressings changed. Incisions/lacerations as pictured below.  Able to wiggle toes. Toes warm and well perfused Calcaneus:        Medial ankle:    IMAGING: Stable post op imaging. Repeat x-rays today  LABS:  Results for orders placed or performed during the hospital encounter of 01/25/21 (from the past 24 hour(s))  Resp Panel by RT-PCR (Flu A&B, Covid) Nasopharyngeal Swab     Status: None   Collection Time: 02/10/21 11:24 AM   Specimen: Nasopharyngeal Swab; Nasopharyngeal(NP) swabs in vial transport medium  Result Value Ref Range   SARS Coronavirus 2 by RT PCR NEGATIVE NEGATIVE   Influenza A by PCR NEGATIVE NEGATIVE   Influenza B by PCR NEGATIVE NEGATIVE    ASSESSMENT: Molly Dennis is a 50 y.o. female, 15 Days Post-Op s/p  ORIF RIGHT TIBIAL PLATEAU INTRAMEDULLARY NAIL RIGHT TIBIAL ORIF RIGHT CALCANEOUS FRACTURE NON-OPERATIVE MANAGEMENT LEFT CLAVICLE  NON-OPERATIVE MANAGEMENT B/L SCAPULA  NON-OPERATIVE MANAGEMENT R acetabulum AND R PUBIC RAMI NON-OPERATIVE MANAGEMENT R GREAT TOE  CV/Blood loss: Hgb 9.0 on 02/04/21, stable  PLAN: Weightbearing: NWB RLE ROM: OK for ankle and knee ROM as tolerated Incisional and dressing care: Reinforce dressing to right ankle and foot PRN. Change knee dressing PRN. Ok to leave those areas open to air if no drainage Showering: Hold off on showering, ok for bed bath Orthopedic device(s): PRAFO RLE  Pain management: per trauma VTE prophylaxis:  Lovenox once Hgb stable , SCDs ID: Ceftriaxone for open fracture completed Foley/Lines: No foley. KVO  IVFs Impediments to Fracture Healing: Vit D level 25, continue D3 supplementation Dispo: PT/OT as able. Will have plastic surgery team evaluate right heel wounds. Plan to treat L clavicle, B/L scapula, R acetabulum, R pubic rami, R great toe fractures all non-operatively.   Follow - up plan: 2 weeks after d/c  Contact information:  Truitt Merle MD, Ulyses Southward PA-C. After hours and holidays please check [email or web address] for group call information for [facility]   Briea Mcenery A. Michaelyn Barter, PA-C [phone] (office) [email or web address]

## 2021-02-12 NOTE — Progress Notes (Signed)
Patient discharged to SNF in stable condition. [facility]  transported patient to facility. Discharge packet and  prescription given to EMT.

## 2021-04-11 ENCOUNTER — Other Ambulatory Visit: Payer: Self-pay

## 2021-04-11 ENCOUNTER — Encounter: Payer: Self-pay | Admitting: Plastic Surgery

## 2021-04-11 ENCOUNTER — Ambulatory Visit (INDEPENDENT_AMBULATORY_CARE_PROVIDER_SITE_OTHER): Payer: Medicare Other | Admitting: Plastic Surgery

## 2021-04-11 VITALS — BP 103/77 | HR 110 | Ht 65.5 in | Wt 170.6 lb

## 2021-04-11 DIAGNOSIS — S92041B Displaced other fracture of tuberosity of right calcaneus, initial encounter for open fracture: Secondary | ICD-10-CM
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
## 2021-05-01 ENCOUNTER — Ambulatory Visit (INDEPENDENT_AMBULATORY_CARE_PROVIDER_SITE_OTHER): Payer: Medicare Other | Admitting: Surgical

## 2021-05-01 ENCOUNTER — Encounter: Payer: Self-pay | Admitting: Surgical

## 2021-05-01 DIAGNOSIS — S92041B Displaced other fracture of tuberosity of right calcaneus, initial encounter for open fracture: Secondary | ICD-10-CM

## 2021-05-01 NOTE — Progress Notes (Signed)
No show

## 2021-05-08 ENCOUNTER — Ambulatory Visit (INDEPENDENT_AMBULATORY_CARE_PROVIDER_SITE_OTHER): Payer: Medicare Other | Admitting: Surgical

## 2021-05-08 ENCOUNTER — Other Ambulatory Visit: Payer: Self-pay

## 2021-05-08 ENCOUNTER — Encounter: Payer: Self-pay | Admitting: Surgical

## 2021-05-08 VITALS — BP 112/76 | HR 94

## 2021-05-08 DIAGNOSIS — S92041B Displaced other fracture of tuberosity of right calcaneus, initial encounter for open fracture: Secondary | ICD-10-CM

## 2021-05-08 MED ORDER — CEPHALEXIN 500 MG PO CAPS: 500.0000 mg | ORAL_CAPSULE | Freq: Four times a day (QID) | ORAL | 0 refills | Status: AC

## 2021-05-08 MED ORDER — ONDANSETRON HCL 4 MG PO TABS: 4.0000 mg | ORAL_TABLET | Freq: Three times a day (TID) | ORAL | 0 refills | Status: DC | PRN
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
## 2021-05-14 ENCOUNTER — Encounter (HOSPITAL_BASED_OUTPATIENT_CLINIC_OR_DEPARTMENT_OTHER): Payer: Self-pay | Admitting: Plastic Surgery

## 2021-05-14 ENCOUNTER — Other Ambulatory Visit: Payer: Self-pay

## 2021-05-21 ENCOUNTER — Encounter (HOSPITAL_BASED_OUTPATIENT_CLINIC_OR_DEPARTMENT_OTHER)
Admission: RE | Disposition: A | Discharge status: Rehabilitation Facility | Payer: Self-pay | Source: Ambulatory Visit | Attending: Plastic Surgery

## 2021-05-21 ENCOUNTER — Other Ambulatory Visit: Payer: Self-pay

## 2021-05-21 ENCOUNTER — Encounter (HOSPITAL_BASED_OUTPATIENT_CLINIC_OR_DEPARTMENT_OTHER): Payer: Self-pay | Admitting: Plastic Surgery

## 2021-05-21 ENCOUNTER — Ambulatory Visit (HOSPITAL_BASED_OUTPATIENT_CLINIC_OR_DEPARTMENT_OTHER): Payer: Medicare Other | Admitting: Anesthesiology

## 2021-05-21 ENCOUNTER — Ambulatory Visit (HOSPITAL_BASED_OUTPATIENT_CLINIC_OR_DEPARTMENT_OTHER)
Admission: RE | Admit: 2021-05-21 | Discharge: 2021-05-21 | Disposition: A | Discharge status: Rehabilitation Facility | Payer: Medicare Other | Source: Ambulatory Visit | Attending: Plastic Surgery | Admitting: Plastic Surgery

## 2021-05-21 DIAGNOSIS — S91301A Unspecified open wound, right foot, initial encounter: Secondary | ICD-10-CM | POA: Insufficient documentation

## 2021-05-21 DIAGNOSIS — F17210 Nicotine dependence, cigarettes, uncomplicated: Secondary | ICD-10-CM | POA: Insufficient documentation

## 2021-05-21 HISTORY — DX: Depression, unspecified: F32.A

## 2021-05-21 HISTORY — DX: Nicotine dependence, unspecified, uncomplicated: F17.200

## 2021-05-21 HISTORY — DX: Other specified health status: Z78.9

## 2021-05-21 HISTORY — DX: Anxiety disorder, unspecified: F41.9

## 2021-05-21 HISTORY — PX: DEBRIDEMENT AND CLOSURE WOUND: SHX5614

## 2021-05-21 SURGERY — DEBRIDEMENT, WOUND, WITH CLOSURE
Anesthesia: General | Site: Foot | Laterality: Right

## 2021-05-21 MED ORDER — ONDANSETRON HCL 4 MG/2ML IJ SOLN
INTRAMUSCULAR | Status: DC | PRN
  Administered 2021-05-21: 4 mg via INTRAVENOUS

## 2021-05-21 MED ORDER — FENTANYL CITRATE (PF) 100 MCG/2ML IJ SOLN
INTRAMUSCULAR | Status: AC
  Filled 2021-05-21: qty 2

## 2021-05-21 MED ORDER — PHENYLEPHRINE 40 MCG/ML (10ML) SYRINGE FOR IV PUSH (FOR BLOOD PRESSURE SUPPORT)
PREFILLED_SYRINGE | INTRAVENOUS | Status: AC
  Filled 2021-05-21: qty 10

## 2021-05-21 MED ORDER — DEXAMETHASONE SODIUM PHOSPHATE 10 MG/ML IJ SOLN
INTRAMUSCULAR | Status: AC
  Filled 2021-05-21: qty 1

## 2021-05-21 MED ORDER — MIDAZOLAM HCL 2 MG/2ML IJ SOLN
INTRAMUSCULAR | Status: AC
  Filled 2021-05-21: qty 2

## 2021-05-21 MED ORDER — CHLORHEXIDINE GLUCONATE CLOTH 2 % EX PADS: 6.0000 | MEDICATED_PAD | Freq: Once | CUTANEOUS | Status: DC

## 2021-05-21 MED ORDER — EPHEDRINE SULFATE 50 MG/ML IJ SOLN
INTRAMUSCULAR | Status: DC | PRN
  Administered 2021-05-21: 15 mg via INTRAVENOUS

## 2021-05-21 MED ORDER — ONDANSETRON HCL 4 MG/2ML IJ SOLN
INTRAMUSCULAR | Status: AC
  Filled 2021-05-21: qty 2

## 2021-05-21 MED ORDER — SUCCINYLCHOLINE CHLORIDE 200 MG/10ML IV SOSY
PREFILLED_SYRINGE | INTRAVENOUS | Status: AC
  Filled 2021-05-21: qty 10

## 2021-05-21 MED ORDER — PHENYLEPHRINE HCL (PRESSORS) 10 MG/ML IV SOLN
INTRAVENOUS | Status: AC
  Filled 2021-05-21: qty 1

## 2021-05-21 MED ORDER — LIDOCAINE 2% (20 MG/ML) 5 ML SYRINGE
INTRAMUSCULAR | Status: AC
  Filled 2021-05-21: qty 5

## 2021-05-21 MED ORDER — PROPOFOL 10 MG/ML IV BOLUS
INTRAVENOUS | Status: DC | PRN
  Administered 2021-05-21: 120 mg via INTRAVENOUS

## 2021-05-21 MED ORDER — LIDOCAINE HCL (CARDIAC) PF 100 MG/5ML IV SOSY
PREFILLED_SYRINGE | INTRAVENOUS | Status: DC | PRN
  Administered 2021-05-21: 50 mg via INTRAVENOUS

## 2021-05-21 MED ORDER — PHENYLEPHRINE HCL-NACL 20-0.9 MG/250ML-% IV SOLN
INTRAVENOUS | Status: DC | PRN
  Administered 2021-05-21: 40 ug/min via INTRAVENOUS

## 2021-05-21 MED ORDER — FENTANYL CITRATE (PF) 100 MCG/2ML IJ SOLN
25.0000 ug | INTRAMUSCULAR | Status: DC | PRN
  Administered 2021-05-21: 50 ug via INTRAVENOUS

## 2021-05-21 MED ORDER — PROPOFOL 10 MG/ML IV BOLUS
INTRAVENOUS | Status: AC
  Filled 2021-05-21: qty 20

## 2021-05-21 MED ORDER — LIDOCAINE-EPINEPHRINE 1 %-1:100000 IJ SOLN
INTRAMUSCULAR | Status: DC | PRN
  Administered 2021-05-21: 7 mL via INTRAMUSCULAR

## 2021-05-21 MED ORDER — CEFAZOLIN SODIUM-DEXTROSE 2-4 GM/100ML-% IV SOLN
2.0000 g | INTRAVENOUS | Status: AC
  Administered 2021-05-21: 2 g via INTRAVENOUS

## 2021-05-21 MED ORDER — KETOROLAC TROMETHAMINE 30 MG/ML IJ SOLN: 30.0000 mg | Freq: Once | INTRAMUSCULAR | Status: DC | PRN

## 2021-05-21 MED ORDER — DEXAMETHASONE SODIUM PHOSPHATE 4 MG/ML IJ SOLN
INTRAMUSCULAR | Status: DC | PRN
  Administered 2021-05-21: 5 mg via INTRAVENOUS

## 2021-05-21 MED ORDER — CEFAZOLIN SODIUM-DEXTROSE 2-4 GM/100ML-% IV SOLN
INTRAVENOUS | Status: AC
  Filled 2021-05-21: qty 100

## 2021-05-21 MED ORDER — EPHEDRINE 5 MG/ML INJ
INTRAVENOUS | Status: AC
  Filled 2021-05-21: qty 5

## 2021-05-21 MED ORDER — OXYCODONE HCL 5 MG/5ML PO SOLN: 5.0000 mg | Freq: Once | ORAL | Status: DC | PRN

## 2021-05-21 MED ORDER — SODIUM CHLORIDE 0.9 % IV SOLN
INTRAVENOUS | Status: AC
  Filled 2021-05-21: qty 10

## 2021-05-21 MED ORDER — ATROPINE SULFATE 0.4 MG/ML IV SOLN
INTRAVENOUS | Status: AC
  Filled 2021-05-21: qty 1

## 2021-05-21 MED ORDER — OXYCODONE HCL 5 MG PO TABS: 5.0000 mg | ORAL_TABLET | Freq: Once | ORAL | Status: DC | PRN

## 2021-05-21 MED ORDER — SODIUM CHLORIDE 0.9 % IV SOLN
INTRAVENOUS | Status: DC | PRN
  Administered 2021-05-21: 500 mL

## 2021-05-21 MED ORDER — FENTANYL CITRATE (PF) 100 MCG/2ML IJ SOLN
INTRAMUSCULAR | Status: DC | PRN
  Administered 2021-05-21: 50 ug via INTRAVENOUS

## 2021-05-21 MED ORDER — ONDANSETRON HCL 4 MG/2ML IJ SOLN: 4.0000 mg | Freq: Once | INTRAMUSCULAR | Status: DC | PRN

## 2021-05-21 MED ORDER — LACTATED RINGERS IV SOLN: INTRAVENOUS | Status: DC

## 2021-05-21 SURGICAL SUPPLY — 35 items
BLADE HEX COATED 2.75 (ELECTRODE) ×2 IMPLANT
BLADE SURG 10 STRL SS (BLADE) ×2 IMPLANT
BLADE SURG 15 STRL LF DISP TIS (BLADE) ×1 IMPLANT
BLADE SURG 15 STRL SS (BLADE) ×2
BNDG ELASTIC 4X5.8 VLCR STR LF (GAUZE/BANDAGES/DRESSINGS) ×4 IMPLANT
BNDG GAUZE ELAST 4 BULKY (GAUZE/BANDAGES/DRESSINGS) ×4 IMPLANT
COVER BACK TABLE 60X90IN (DRAPES) ×2 IMPLANT
COVER MAYO STAND STRL (DRAPES) ×2 IMPLANT
DRAPE U-SHAPE 76X120 STRL (DRAPES) ×2 IMPLANT
DRSG ADAPTIC 3X8 NADH LF (GAUZE/BANDAGES/DRESSINGS) ×2 IMPLANT
DRSG PAD ABDOMINAL 8X10 ST (GAUZE/BANDAGES/DRESSINGS) ×2 IMPLANT
ELECT REM PT RETURN 9FT ADLT (ELECTROSURGICAL) ×2
ELECTRODE REM PT RTRN 9FT ADLT (ELECTROSURGICAL) ×1 IMPLANT
GAUZE SPONGE 4X4 12PLY STRL (GAUZE/BANDAGES/DRESSINGS) ×2 IMPLANT
GLOVE SURG ENC MOIS LTX SZ6.5 (GLOVE) ×4 IMPLANT
GOWN STRL REUS W/ TWL LRG LVL3 (GOWN DISPOSABLE) ×2 IMPLANT
GOWN STRL REUS W/TWL LRG LVL3 (GOWN DISPOSABLE) ×4
MATRIX WOUND 3-LAYER 7X10 (Tissue) ×2 IMPLANT
MICROMATRIX 500MG (Tissue) ×2 IMPLANT
NEEDLE HYPO 25X1 1.5 SAFETY (NEEDLE) ×2 IMPLANT
NS IRRIG 1000ML POUR BTL (IV SOLUTION) ×2 IMPLANT
PACK BASIN DAY SURGERY FS (CUSTOM PROCEDURE TRAY) ×2 IMPLANT
PENCIL SMOKE EVACUATOR (MISCELLANEOUS) ×2 IMPLANT
SHEET MEDIUM DRAPE 40X70 STRL (DRAPES) ×2 IMPLANT
SLEEVE SCD COMPRESS KNEE MED (STOCKING) ×2 IMPLANT
SOLUTION PARTIC MCRMTRX 500MG (Tissue) ×1 IMPLANT
SPONGE T-LAP 18X18 ~~LOC~~+RFID (SPONGE) ×2 IMPLANT
SUT VIC AB 5-0 PS2 18 (SUTURE) ×6 IMPLANT
SYR BULB IRRIG 60ML STRL (SYRINGE) ×2 IMPLANT
SYR CONTROL 10ML LL (SYRINGE) ×2 IMPLANT
TOWEL GREEN STERILE FF (TOWEL DISPOSABLE) ×4 IMPLANT
TRAY DSU PREP LF (CUSTOM PROCEDURE TRAY) ×2 IMPLANT
TUBE CONNECTING 20X1/4 (TUBING) ×2 IMPLANT
UNDERPAD 30X36 HEAVY ABSORB (UNDERPADS AND DIAPERS) ×2 IMPLANT
YANKAUER SUCT BULB TIP NO VENT (SUCTIONS) ×2 IMPLANT

## 2021-05-21 NOTE — Transfer of Care (Signed)
Immediate Anesthesia Transfer of Care Note  Patient: Molly Dennis  Procedure(s) Performed: Debridement and excision of right heel wound (Right: Foot) APPLICATION OF SKIN SUBSTITUTE (Right: Foot)  Patient Location: PACU  Anesthesia Type:General  Level of Consciousness: awake, alert , oriented, drowsy and patient cooperative  Airway & Oxygen Therapy: Patient Spontanous Breathing and Patient connected to nasal cannula oxygen  Post-op Assessment: Report given to RN and Post -op Vital signs reviewed and stable  Post vital signs: Reviewed and stable  Last Vitals:  Vitals Value Taken Time  BP    Temp    Pulse 88 05/21/21 1407  Resp    SpO2 100 % 05/21/21 1407  Vitals shown include unvalidated device data.  Last Pain:  Vitals:   05/21/21 1203  TempSrc: Oral  PainSc: 10-Worst pain ever      Patients Stated Pain Goal: 3 (05/21/21 1203)  Complications: No notable events documented.

## 2021-05-21 NOTE — Interval H&P Note (Signed)
History and Physical Interval Note:  05/21/2021 12:40 PM  Molly Dennis  has presented today for surgery, with the diagnosis of Open displaced fracture of tuberosity of right calcaneus, unspecified fracture morphology.  The various methods of treatment have been discussed with the patient and family. After consideration of risks, benefits and other options for treatment, the patient has consented to  Procedure(s): Debridement and excision of right heel wound (Right) APPLICATION OF SKIN SUBSTITUTE (Right) as a surgical intervention.  The patient's history has been reviewed, patient examined, no change in status, stable for surgery.  I have reviewed the patient's chart and labs.  Questions were answered to the patient's satisfaction.     Alena Bills Molly Dennis

## 2021-05-21 NOTE — Op Note (Signed)
DATE OF OPERATION: 05/21/2021  LOCATION: [facility]  PREOPERATIVE DIAGNOSIS: right heel wound  POSTOPERATIVE DIAGNOSIS: Same  PROCEDURE: Excision of right heel wound skin, soft tissue and muscle 7 x 8 cm with placement of Acell 7 x 10 cm sheet and 500 mg powder.  SURGEON: Mercedees Convery Sanger Junius Faucett, DO  ASSISTANT: [name], PA, Dr. Janne Napoleon  EBL: 5 cc  CONDITION: Stable  COMPLICATIONS: None  INDICATION: The patient, Molly Dennis, is a 50 y.o. female born on 25-Jun-1971, is here for treatment of a right heel wound.   PROCEDURE DETAILS:  The patient was seen prior to surgery and marked.  The IV antibiotics were given. The patient was taken to the operating room and given a general anesthetic. A standard time out was performed and all information was confirmed by those in the room. SCD was placed on the left leg.   The right foot was prepped and draped.  There was a large piece of necrotic tissue that was excised with a pair of tissue scissors.  The foot was then irrigated with saline and antibiotic solution.  The #10 blade was used to size the 7 x 8 cm area of nonviable skin and soft tissue and a 3 x 3 centimeter area of nonviable muscle.  Hemostasis was achieved with electrocautery and local.  All of the ACell powder and sheet was applied and secured with 5-0 Vicryl.  Adaptic was applied and secured with the Vicryl as well.  The leg was wrapped with Kerlix and an Ace wrap.  The patient was allowed to wake up and taken to recovery room in stable condition at the end of the case. The family was notified at the end of the case.   The advanced practice practitioner (APP) assisted throughout the case.  The APP was essential in retraction and counter traction when needed to make the case progress smoothly.  This retraction and assistance made it possible to see the tissue plans for the procedure.  The assistance was needed for blood control, tissue re-approximation and  assisted with closure of the incision site.

## 2021-05-21 NOTE — Anesthesia Procedure Notes (Signed)
Procedure Name: LMA Insertion Date/Time: 05/21/2021 1:23 PM Performed by: Ronnette Hila, CRNA Pre-anesthesia Checklist: Patient identified, Emergency Drugs available, Suction available and Patient being monitored Patient Re-evaluated:Patient Re-evaluated prior to induction Oxygen Delivery Method: Circle system utilized Preoxygenation: Pre-oxygenation with 100% oxygen Induction Type: IV induction Ventilation: Mask ventilation without difficulty LMA: LMA inserted LMA Size: 4.0 Number of attempts: 1 Airway Equipment and Method: Bite block Placement Confirmation: positive ETCO2 Tube secured with: Tape Dental Injury: Teeth and Oropharynx as per pre-operative assessment

## 2021-05-21 NOTE — Anesthesia Postprocedure Evaluation (Signed)
Anesthesia Post Note  Patient: Molly Dennis  Procedure(s) Performed: Debridement and excision of right heel wound (Right: Foot) APPLICATION OF SKIN SUBSTITUTE (Right: Foot)     Patient location during evaluation: PACU Anesthesia Type: General Level of consciousness: awake and alert Pain management: pain level controlled Vital Signs Assessment: post-procedure vital signs reviewed and stable Respiratory status: spontaneous breathing, nonlabored ventilation, respiratory function stable and patient connected to nasal cannula oxygen Cardiovascular status: blood pressure returned to baseline and stable Postop Assessment: no apparent nausea or vomiting Anesthetic complications: no   No notable events documented.  Last Vitals:  Vitals:   05/21/21 1415 05/21/21 1430  BP: 104/76 112/73  Pulse: 86 75  Resp: 13 13  Temp:    SpO2: 97% 94%    Last Pain:  Vitals:   05/21/21 1408  TempSrc:   PainSc: 0-No pain                 Jonatha Gagen S

## 2021-05-21 NOTE — Anesthesia Preprocedure Evaluation (Signed)
Anesthesia Evaluation  Patient identified by MRN, date of birth, ID band Patient awake    Reviewed: Allergy & Precautions, NPO status , Patient's Chart, lab work & pertinent test results  Airway Mallampati: II  TM Distance: >3 FB Neck ROM: Full    Dental no notable dental hx.    Pulmonary asthma , COPD,  COPD inhaler, Current Smoker,    Pulmonary exam normal breath sounds clear to auscultation       Cardiovascular negative cardio ROS Normal cardiovascular exam Rhythm:Regular Rate:Normal     Neuro/Psych Seizures -, Well Controlled,  Anxiety Depression CVA    GI/Hepatic negative GI ROS, (+)     substance abuse  cocaine use,   Endo/Other  negative endocrine ROS  Renal/GU negative Renal ROS  negative genitourinary   Musculoskeletal negative musculoskeletal ROS (+)   Abdominal   Peds negative pediatric ROS (+)  Hematology negative hematology ROS (+)   Anesthesia Other Findings   Reproductive/Obstetrics negative OB ROS                             Anesthesia Physical Anesthesia Plan  ASA: 3  Anesthesia Plan: General   Post-op Pain Management:    Induction: Intravenous  PONV Risk Score and Plan: 2 and Ondansetron, Dexamethasone and Treatment may vary due to age or medical condition  Airway Management Planned: LMA  Additional Equipment:   Intra-op Plan:   Post-operative Plan: Extubation in OR  Informed Consent: I have reviewed the patients History and Physical, chart, labs and discussed the procedure including the risks, benefits and alternatives for the proposed anesthesia with the patient or authorized representative who has indicated his/her understanding and acceptance.     Dental advisory given  Plan Discussed with: CRNA and Surgeon  Anesthesia Plan Comments:         Anesthesia Quick Evaluation

## 2021-05-21 NOTE — Discharge Instructions (Addendum)
Wound Care with Acell  Guide to Wound Care  Proper wound care may reduce the risk of infection, improve healing rates, and limit scarring.  This is a general guide to help care for and manage wounds treated with ACell MicroMatrix or Cytal Wound Matrix.   Dressing Changes The frequency of dressing changes can vary based on which product was applied, the size of the wound, or the amount of wound drainage. Dressing inspections are recommended, at least weekly.   If you have a Wound VAC it will be changed in one week after the first time it is applied.  Then it will be changed once or twice a week.   If you don't have a Wound VAC, then place KY gel on the wound daily and cover with gauze.  Dressing Types Primary Dressing:  Non-adherent dressing goes directly over wounds being treated with the powder or sheet (MicroMatrix and/or Cytal).  Secondary Dressing:  Secures the primary dressing in place and provides extra protection, compression, and absorption.  1. Wash Hands - To help decrease the risk of infection, caregivers should wash their hands for a minimum of 20 seconds and may use medical gloves.   2. Remove the Dressings - Avoid removing product from the wound by carefully removing the applicable dressing(s) at the time points recommended above, or as recommended by the treating physician.  Expected Color and Odor:  It is entirely normal for the wound to have an unpleasant odor and to form a caramel-colored gel as the product absorbs into the wound. It is  important to leave this gel on the wound site.  3. Clean the Wound - Use clean water or saline to gently rinse around the wound surface and remove any excess discharge that may be present on the wound. Do not wipe off any of the caramel-colored gel on the wound.   What to look out for:  Large or increased amount of drainage   Surrounding skin has worsening redness or hot to touch   Increased pain in or around the wound   Flu-like  symptoms, fatigue, decreased appetite, fever   Hard, crusty wound surface with black or brown coloring  4. Apply New Dressings - Dressings should cover the entire wound and be suitable for maintaining a moist wound environment.  The non-adherent mesh dressing should be left in place.  New dressing should consist of KY Jelly to keep the wound moist and soft gauze secured with a wrap or tape.   Maintain a Hydrated Wound Area It is important to keep the wound area moist throughout the healing process. If the wound appears to be dry during dressing changes, select a dressing that will hydrate the wound and maintain that ideal moist environment. If you are unsure what to do, ask the treating physician.  Remodeling Process Every patient heals differently, and no two cases are the same. The size and location of the wound, product type and layering configurations, and general patient health all contribute to how quickly a wound will heal.  While many factors can influence the rate at which the product absorbs, the following can be used as a general guide.   THINGS TO DO: Refrain from smoking High protein diet with plenty of vegetables and some fruit  Limit simple processed carbohydrates and sugar Protect the wound from trauma Protect the dressing  Micromatrix powder       Cytal Sheet            Adaptic dressing

## 2021-05-22 ENCOUNTER — Encounter (HOSPITAL_BASED_OUTPATIENT_CLINIC_OR_DEPARTMENT_OTHER): Payer: Self-pay | Admitting: Plastic Surgery

## 2021-05-22 ENCOUNTER — Telehealth: Payer: Self-pay | Admitting: Plastic Surgery

## 2021-05-22 NOTE — Addendum Note (Signed)
Addendum  created 05/22/21 1227 by Thornell Mule, CRNA   Charge Capture section accepted

## 2021-05-22 NOTE — Telephone Encounter (Signed)
Matt spoke with patient. She lives in a facility and should contact the physician there for more pain medication. We are unable to send it to her place of living via Epic.

## 2021-05-22 NOTE — Addendum Note (Signed)
Addendum  created 05/22/21 0759 by Ronnette Hila, CRNA   Charge Capture section accepted

## 2021-05-22 NOTE — Telephone Encounter (Signed)
Patient is requesting a prescription for pain medication. Please call to update [phone]. Thank you.

## 2021-05-26 ENCOUNTER — Telehealth: Payer: Self-pay

## 2021-05-26 NOTE — Telephone Encounter (Signed)
P[place]ia called from [facility] to say patient had a procedure on her right foot.  She said the healing graft was supposed to stay on until she comes back to see [place], but it came off over the weekend.  Please call.

## 2021-05-26 NOTE — Telephone Encounter (Signed)
Please advise. Thanks.  

## 2021-05-26 NOTE — Telephone Encounter (Signed)
Felicia called back and spoke to [place] personally.

## 2021-05-30 ENCOUNTER — Ambulatory Visit (INDEPENDENT_AMBULATORY_CARE_PROVIDER_SITE_OTHER): Payer: Medicare Other | Admitting: Surgical

## 2021-05-30 ENCOUNTER — Other Ambulatory Visit: Payer: Self-pay

## 2021-05-30 ENCOUNTER — Encounter: Payer: Medicare Other | Admitting: Plastic Surgery

## 2021-05-30 ENCOUNTER — Encounter: Payer: Self-pay | Admitting: Surgical

## 2021-05-30 DIAGNOSIS — S92041B Displaced other fracture of tuberosity of right calcaneus, initial encounter for open fracture: Secondary | ICD-10-CM

## 2021-05-30 NOTE — Progress Notes (Signed)
   Referring Provider Clinic, [facility] [address]LO[address] [place],  [place] [address]   CC:  Chief Complaint  Patient presents with   Post-op Follow-up      Molly Dennis is an 50 y.o. female.  HPI: Patient is a 50 year old female here for follow-up on her right heel wound.  She underwent excision of the right heel wound and placement of ACell with Dr. Ulice Bold on 05/21/2021.  She is here with her sister.  She is overall doing well.  She is currently residing in a care facility and they are helping with dressing changes.  There was some concern that the ACell was removed at the care facility accidentally.  Today on my evaluation the Adaptic and ACell sheet that was sutured down was no longer present.  It is unclear if the ACell had incorporated prior to removal of the Adaptic or not.  However, the Adaptic was removed accidentally at the care facility which was sutured in place.  She is not having any infectious symptoms.  Review of Systems General: No fevers or chills or nausea or vomiting  Physical Exam Vitals with BMI 05/21/2021 05/21/2021 05/21/2021  Height - - -  Weight - - -  BMI - - -  Systolic 128 107 124  Diastolic 81 77 73  Pulse 75 79 75    General:  No acute distress,  Alert and oriented, Non-Toxic, Normal speech and affect Right lower extremity: Right lower extremity heel wound with overall good base of granulation tissue, no wound matrix is present at this time.  There is some fibrinous exudate noted at the distal lateralmost portion.  Otherwise it appears to be doing well.  The wound bed is flush with the surrounding epithelium.  There is no cellulitic changes or foul odor is noted     Assessment/Plan Recommend Adaptic, K-Y jelly, 4 x 4 gauze, Kerlix, Ace wrap dressing changes every other day.  With dressing changes, recommend removing the existing dressings all the way down to the Adaptic sheet and then reapplying a new Adaptic sheet, covering this  with K-Y jelly, 4 x 4 gauze, Kerlix and Ace wrap.  Recommend wrapping the lower extremity from the distal foot to the proximal calf.  May begin with endoform or collagen dressing changes at her next appointment depending on wound appearance.  Recommend following up in 3 weeks for reevaluation.  Recommend calling with questions or concerns.  Pictures were taken and placed in the patient chart with patient's permission.  No signs of infection on exam.  We will provide instructions for care facility.  Kermit Balo Catelynn Sparger 05/30/2021, 12:11 PM

## 2021-06-16 NOTE — Progress Notes (Signed)
Patient is a 50 year old female s/p right heel debridement with placement of ACell performed by Dr. Ulice Bold 05/21/2021 who presents to clinic for postoperative follow-up.  She was last seen here in clinic on 05/30/2021.  At that time, the Adaptic and ACell sheet that had been secured with sutures in the OR were not present.  Physical exam was reassuring, could base of granulation tissue.  Plan was for Adaptic, K-Y jelly, and 4 x 4 gauze secured with Kerlix and Ace wrap.  Dressing changes every other day.  Consideration for endoform or collagen dressing changes at next appointment.  Today, patient is accompanied by her niece who is at bedside.  She states that her swelling has improved since last encounter, but still appears swollen on my exam.  She is currently wheelchair-bound, but hoping to begin physical therapy when she has improved wound healing.  She reports that her pain symptoms are improving, continues Percocet only as needed.  She is at a skilled nursing facility.  She denies any fevers or chills.  Physical exam is largely reassuring.  Please see picture.  Good granular tissue noted.  Mild swelling in ankles, no significant pedal edema.  Pedal pulse intact.  Sensation intact throughout.  No purulent drainage from site of ACell placement.  Given improvement in her swelling and pain symptoms, do not feel as though imaging is warranted at this time.  Does not appear cellulitic.  She has formed a good granular base and would benefit from transition to collagen at this time.  She is neurovascularly intact.  Recommending that they elevate her leg more frequently to mitigate swelling.  She will call the clinic should she develop any new or worsening symptoms, otherwise she can return in 2 to 3 weeks. Picture(s) obtained of the patient and placed in the chart were with the patient's or guardian's permission.

## 2021-06-17 ENCOUNTER — Ambulatory Visit (INDEPENDENT_AMBULATORY_CARE_PROVIDER_SITE_OTHER): Payer: Medicare Other | Admitting: Physician Assistant

## 2021-06-17 ENCOUNTER — Other Ambulatory Visit: Payer: Self-pay

## 2021-06-17 DIAGNOSIS — S92041B Displaced other fracture of tuberosity of right calcaneus, initial encounter for open fracture: Secondary | ICD-10-CM

## 2021-06-24 ENCOUNTER — Encounter: Payer: Medicare Other | Admitting: Physician Assistant

## 2021-06-30 ENCOUNTER — Ambulatory Visit (INDEPENDENT_AMBULATORY_CARE_PROVIDER_SITE_OTHER): Payer: Medicare Other | Admitting: Physician Assistant

## 2021-06-30 ENCOUNTER — Other Ambulatory Visit: Payer: Self-pay

## 2021-06-30 DIAGNOSIS — S92041B Displaced other fracture of tuberosity of right calcaneus, initial encounter for open fracture: Secondary | ICD-10-CM
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
## 2021-07-15 ENCOUNTER — Ambulatory Visit: Payer: Self-pay | Admitting: Student

## 2021-07-15 DIAGNOSIS — S92001B Unspecified fracture of right calcaneus, initial encounter for open fracture: Secondary | ICD-10-CM

## 2021-07-18 NOTE — Progress Notes (Signed)
Referring Provider Clinic, [facility] [address] FLO[address] [place],  [place] [address]   CC:  Chief Complaint  Patient presents with   Follow-up      Lexey Fletes is an 50 y.o. female.  HPI: Patient is a 50 year old female s/p right heel debridement with placement of ACell performed by Dr. Ulice Bold 05/21/2021 who presents to clinic for postoperative follow-up.  She was last seen here in clinic on 06/30/2021.  At that time, exam was largely reassuring.  However, there had not been a considerable advancing epithelization.  Plan was for continued collagen dressing changes 3 times per week.  She has surgery scheduled with Dr. Jena Gauss to remove hardware in the right foot 07/23/2021.  Today, she reports continued improvement.  She states she is starting to become bothered by her screws, but will have her screws removed tomorrow.  Otherwise, she feels as though her redness and swelling has improved.  She continues to receive regular collagen dressing changes.   No Known Allergies  Outpatient Encounter Medications as of 07/22/2021  Medication Sig Note   acetaminophen (TYLENOL) 500 MG tablet Take 1,000 mg by mouth every 6 (six) hours as needed for moderate pain.    albuterol (VENTOLIN HFA) 108 (90 Base) MCG/ACT inhaler Inhale 1-2 puffs into the lungs every 6 (six) hours as needed for wheezing.    amitriptyline (ELAVIL) 50 MG tablet Take 50 mg by mouth at bedtime.    ARIPiprazole (ABILIFY) 10 MG tablet Take 10 mg by mouth in the morning.    atomoxetine (STRATTERA) 60 MG capsule Take 60 mg by mouth every morning. 01/25/2021: Needs a refill   atorvastatin (LIPITOR) 40 MG tablet Take 40 mg by mouth daily.    calcium carbonate (TUMS EX) 750 MG chewable tablet Chew 1 tablet by mouth every 4 (four) hours as needed for heartburn.    diclofenac Sodium (VOLTAREN) 1 % GEL Apply 1 application topically 4 (four) times daily as needed (right knee pain).    DULoxetine (CYMBALTA) 60 MG capsule  Take 60 mg by mouth daily.    fluticasone-salmeterol (ADVAIR) 100-50 MCG/ACT AEPB Inhale 1 puff into the lungs 2 (two) times daily.    gabapentin (NEURONTIN) 300 MG capsule Take 300 mg by mouth 3 (three) times daily.    guaiFENesin (ROBITUSSIN) 100 MG/5ML SOLN Take 10 mLs (200 mg total) by mouth every 4 (four) hours as needed for cough or to loosen phlegm.    levETIRAcetam (KEPPRA) 500 MG tablet Take 1 tablet (500 mg total) by mouth 2 (two) times daily. (Patient taking differently: Take 500 mg by mouth at bedtime.)    loperamide (IMODIUM) 2 MG capsule Take 2 mg by mouth every 4 (four) hours as needed for diarrhea or loose stools.    oxyCODONE (OXY IR/ROXICODONE) 5 MG immediate release tablet Take 7.5 mg by mouth every 4 (four) hours as needed (moderate to severe pain.).    oxyCODONE (ROXICODONE) 5 MG/5ML solution Take 5-10 mLs (5-10 mg total) by mouth every 4 (four) hours as needed for moderate pain. (Patient not taking: Reported on 07/17/2021)    pantoprazole (PROTONIX) 20 MG tablet Take 20 mg by mouth daily.    QUEtiapine (SEROQUEL) 400 MG tablet Take 800 mg by mouth at bedtime.    tamsulosin (FLOMAX) 0.4 MG CAPS capsule Take 1 capsule (0.4 mg total) by mouth daily.    No facility-administered encounter medications on file as of 07/22/2021.     Past Medical History:  Diagnosis Date  Anxiety    Asthma    COPD (chronic obstructive pulmonary disease) (HCC)    Depression    Difficult intravenous access    Seizures (HCC)    Smoker    Stroke [facility])     Past Surgical History:  Procedure Laterality Date   DEBRIDEMENT AND CLOSURE WOUND Right 05/21/2021   Procedure: Debridement and excision of right heel wound;  Surgeon: Peggye Form, DO;  Location: [facility];  Service: Plastics;  Laterality: Right;   FOOT SURGERY     I & D EXTREMITY Right 01/25/2021   Procedure: IRRIGATION AND DEBRIDEMENT RIGHT TIBIAL/FIBULA,, CALCANEUS;  Surgeon: Samson Frederic, MD;  Location: [facility]  OR;  Service: Orthopedics;  Laterality: Right;   ORIF CALCANEOUS FRACTURE Right 01/27/2021   Procedure: OPEN REDUCTION INTERNAL FIXATION (ORIF) CALCANEOUS FRACTURE;  Surgeon: Roby Lofts, MD;  Location: [facility] OR;  Service: Orthopedics;  Laterality: Right;   ORIF TIBIA PLATEAU Right 01/27/2021   Procedure: OPEN REDUCTION INTERNAL FIXATION (ORIF) TIBIAL PLATEAU;  Surgeon: Roby Lofts, MD;  Location: [facility] OR;  Service: Orthopedics;  Laterality: Right;   SHOULDER SURGERY     TIBIA IM NAIL INSERTION Right 01/27/2021   Procedure: INTRAMEDULLARY (IM) NAIL TIBIAL;  Surgeon: Roby Lofts, MD;  Location: [facility] OR;  Service: Orthopedics;  Laterality: Right;    No family history on file.  Social History   Social History Narrative   ** Merged History Encounter **         Review of Systems General: Denies fevers or chills Skin: Endorses improved swelling and redness, denies new redness, pain, or appreciable drainage.  Physical Exam Vitals with BMI 05/21/2021 05/21/2021 05/21/2021  Height - - -  Weight - - -  BMI - - -  Systolic 128 107 109  Diastolic 81 77 73  Pulse 75 79 75    General:  No acute distress, nontoxic appearing  Respiratory: No increased work of breathing Neuro: Alert and oriented Psychiatric: Normal mood and affect  Skin: Right heel wound.  7 x 6 x 0.5 cm wound noted, good granular base.  Hardened exudate along periphery of wound.  No cellulitic findings.  Pedal pulse intact.  Assessment/Plan  Physical exam is reassuring.  Hardened exudate along periphery that was debrided here at bedside.  Procedure was tolerated without difficulty or complication.  Pedal pulse intact.  No considerable drainage appreciated.  Placed Adaptic followed by K-Y jelly, gauze, and secured with Kerlix and Ace wrap.  Updated instructions provided to her SNF.  Asked that patient return in 3 to 4 weeks for follow-up.  Picture(s) obtained of the patient and placed in the chart were with the patient's  or guardian's permission.  [name] 07/22/2021, 10:57 AM

## 2021-07-22 ENCOUNTER — Other Ambulatory Visit: Payer: Self-pay

## 2021-07-22 ENCOUNTER — Encounter (HOSPITAL_COMMUNITY): Payer: Self-pay | Admitting: Student

## 2021-07-22 ENCOUNTER — Ambulatory Visit (INDEPENDENT_AMBULATORY_CARE_PROVIDER_SITE_OTHER): Payer: Medicare Other | Admitting: Physician Assistant

## 2021-07-22 DIAGNOSIS — Z9889 Other specified postprocedural states: Secondary | ICD-10-CM

## 2021-07-22 NOTE — Pre-Procedure Instructions (Signed)
Molly Dennis  07/22/2021    Ms. Dennis's procedure is scheduled on Wednesday, 07/23/21 at 8:30 AM   Report to [facility] Entrance "A" Admitting Office  at 6:30 AM.   Call this number if you have problems the morning of surgery: [phone]   Remember:  Do not eat food after midnight tonight.  Patient may drink clear liquids until 5:30 AM.  Clear liquids allowed are: Water, Juice (non-citric and without pulp - diabetics please choose diet or no sugar options), Carbonated beverages - (diabetics please choose diet or no sugar options), Clear Tea, Black Coffee only (no creamer, milk or cream including half and half), and Gatorade (diabetics please choose diet or no sugar options)    Take these medicines the morning of surgery with A SIP OF WATER: Aripiprazole (Abilify), Atorvastatin (Lipitor) Duloxetine (Cymbalta), Gabapentin (Neurontin), Leviteracetam (Keppra), Pantoprazole (Protonx), Tamsulosin (Flomax), Acetaminophen - prn, Loperamide (Imodium) - prn, Oxycodone - prn, Robitussin - prn, Advair inhaler, Ventolin inhaler - prn (have patient bring with her day of surgery)  Per instructions from Dr. Odella Aquas, Anesthesiologist/Forbes Loll Michail Jewels, RN  Patient is not to smoke as of now prior to surgery.    Do not wear jewelry, make-up or nail polish.            Shower tonight with antibacterial soap. Wear clean pajamas tonight and have clean sheets on bed.   Do not wear lotions, powders, perfumes or deodorant after showering.   Do not shave 48 hours prior to surgery.    Do not bring valuables to the hospital.  [facility] is not responsible for any belongings or valuables.   Contacts, dentures or bridgework may not be worn into surgery.    Patients discharged the day of surgery will not be allowed to drive home.   Any questions today, please call me, Hyman Bible, RN at [phone].

## 2021-07-22 NOTE — Progress Notes (Signed)
Pt is a patient at [facility]. I spoke with Cleo, LPN, Unit Manager for pre-op call. She state pt is alert, oriented and can speak for herself. No know cardiac history and pt is not diabetic.   Faxed pre-op instructions to Cleo at [phone] (see written not in Epic).  Pt's surgery is scheduled as ambulatory so no Covid test is required prior to surgery.
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
## 2021-07-23 ENCOUNTER — Encounter (HOSPITAL_COMMUNITY): Payer: Self-pay | Admitting: Student

## 2021-07-23 ENCOUNTER — Encounter (HOSPITAL_COMMUNITY)
Admission: RE | Disposition: A | Discharge status: Skilled Nursing Facility | Payer: Self-pay | Source: Ambulatory Visit | Attending: Student

## 2021-07-23 ENCOUNTER — Ambulatory Visit (HOSPITAL_COMMUNITY): Payer: Medicare Other

## 2021-07-23 ENCOUNTER — Ambulatory Visit (HOSPITAL_COMMUNITY)
Admission: RE | Admit: 2021-07-23 | Discharge: 2021-07-23 | Disposition: A | Discharge status: Skilled Nursing Facility | Payer: Medicare Other | Source: Ambulatory Visit | Attending: Student | Admitting: Student

## 2021-07-23 ENCOUNTER — Ambulatory Visit (HOSPITAL_COMMUNITY): Payer: Medicare Other | Admitting: Anesthesiology

## 2021-07-23 DIAGNOSIS — S91309A Unspecified open wound, unspecified foot, initial encounter: Secondary | ICD-10-CM | POA: Insufficient documentation

## 2021-07-23 DIAGNOSIS — Z419 Encounter for procedure for purposes other than remedying health state, unspecified: Secondary | ICD-10-CM

## 2021-07-23 DIAGNOSIS — S92041B Displaced other fracture of tuberosity of right calcaneus, initial encounter for open fracture: Secondary | ICD-10-CM | POA: Insufficient documentation

## 2021-07-23 DIAGNOSIS — Z472 Encounter for removal of internal fixation device: Secondary | ICD-10-CM | POA: Insufficient documentation

## 2021-07-23 HISTORY — PX: HARDWARE REMOVAL: SHX979

## 2021-07-23 LAB — POCT I-STAT, CHEM 8
BUN: 11 mg/dL (ref 6–20)
Calcium, Ion: 1.17 mmol/L (ref 1.15–1.40)
Chloride: 100 mmol/L (ref 98–111)
Creatinine, Ser: 0.8 mg/dL (ref 0.44–1.00)
Glucose, Bld: 95 mg/dL (ref 70–99)
HCT: 35 % — ABNORMAL LOW (ref 36.0–46.0)
Hemoglobin: 11.9 g/dL — ABNORMAL LOW (ref 12.0–15.0)
Potassium: 4.4 mmol/L (ref 3.5–5.1)
Sodium: 139 mmol/L (ref 135–145)
TCO2: 31 mmol/L (ref 22–32)

## 2021-07-23 SURGERY — REMOVAL, HARDWARE
Anesthesia: General | Laterality: Right

## 2021-07-23 MED ORDER — CEFAZOLIN SODIUM-DEXTROSE 2-4 GM/100ML-% IV SOLN
2.0000 g | INTRAVENOUS | Status: AC
Start: 2021-07-23 — End: 2021-07-23
  Administered 2021-07-23: 09:00:00 2 g via INTRAVENOUS
  Filled 2021-07-23: qty 100

## 2021-07-23 MED ORDER — OXYCODONE HCL 5 MG PO TABS: 5.0000 mg | ORAL_TABLET | Freq: Four times a day (QID) | ORAL | 0 refills | Status: DC | PRN

## 2021-07-23 MED ORDER — ONDANSETRON HCL 4 MG/2ML IJ SOLN
INTRAMUSCULAR | Status: AC
  Filled 2021-07-23: qty 2

## 2021-07-23 MED ORDER — LACTATED RINGERS IV SOLN: INTRAVENOUS | Status: DC

## 2021-07-23 MED ORDER — PROPOFOL 10 MG/ML IV BOLUS
INTRAVENOUS | Status: AC
  Filled 2021-07-23: qty 20

## 2021-07-23 MED ORDER — DEXAMETHASONE SODIUM PHOSPHATE 10 MG/ML IJ SOLN
INTRAMUSCULAR | Status: AC
  Filled 2021-07-23: qty 1

## 2021-07-23 MED ORDER — 0.9 % SODIUM CHLORIDE (POUR BTL) OPTIME
TOPICAL | Status: DC | PRN
  Administered 2021-07-23: 09:00:00 1000 mL

## 2021-07-23 MED ORDER — PHENYLEPHRINE HCL-NACL 20-0.9 MG/250ML-% IV SOLN
INTRAVENOUS | Status: DC | PRN
  Administered 2021-07-23: 50 ug/min via INTRAVENOUS

## 2021-07-23 MED ORDER — CHLORHEXIDINE GLUCONATE 0.12 % MT SOLN
15.0000 mL | OROMUCOSAL | Status: AC
  Administered 2021-07-23: 07:00:00 15 mL via OROMUCOSAL
  Filled 2021-07-23: qty 15

## 2021-07-23 MED ORDER — LIDOCAINE 2% (20 MG/ML) 5 ML SYRINGE
INTRAMUSCULAR | Status: AC
  Filled 2021-07-23: qty 5

## 2021-07-23 MED ORDER — DEXAMETHASONE SODIUM PHOSPHATE 10 MG/ML IJ SOLN
INTRAMUSCULAR | Status: DC | PRN
  Administered 2021-07-23: 10 mg via INTRAVENOUS

## 2021-07-23 MED ORDER — FENTANYL CITRATE (PF) 100 MCG/2ML IJ SOLN
25.0000 ug | INTRAMUSCULAR | Status: DC | PRN
  Administered 2021-07-23 (×2): 25 ug via INTRAVENOUS

## 2021-07-23 MED ORDER — PROPOFOL 10 MG/ML IV BOLUS
INTRAVENOUS | Status: DC | PRN
  Administered 2021-07-23: 140 mg via INTRAVENOUS

## 2021-07-23 MED ORDER — ACETAMINOPHEN 500 MG PO TABS
1000.0000 mg | ORAL_TABLET | Freq: Once | ORAL | Status: AC
  Administered 2021-07-23: 07:00:00 1000 mg via ORAL
  Filled 2021-07-23: qty 2

## 2021-07-23 MED ORDER — AMISULPRIDE (ANTIEMETIC) 5 MG/2ML IV SOLN: 10.0000 mg | Freq: Once | INTRAVENOUS | Status: DC | PRN

## 2021-07-23 MED ORDER — FENTANYL CITRATE (PF) 250 MCG/5ML IJ SOLN
INTRAMUSCULAR | Status: AC
  Filled 2021-07-23: qty 5

## 2021-07-23 MED ORDER — MIDAZOLAM HCL 2 MG/2ML IJ SOLN
INTRAMUSCULAR | Status: AC
  Filled 2021-07-23: qty 2

## 2021-07-23 MED ORDER — IPRATROPIUM-ALBUTEROL 0.5-2.5 (3) MG/3ML IN SOLN
3.0000 mL | RESPIRATORY_TRACT | Status: AC
  Administered 2021-07-23: 08:00:00 3 mL via RESPIRATORY_TRACT

## 2021-07-23 MED ORDER — IPRATROPIUM-ALBUTEROL 0.5-2.5 (3) MG/3ML IN SOLN
RESPIRATORY_TRACT | Status: AC
  Filled 2021-07-23: qty 3

## 2021-07-23 MED ORDER — FENTANYL CITRATE (PF) 250 MCG/5ML IJ SOLN
INTRAMUSCULAR | Status: DC | PRN
  Administered 2021-07-23: 25 ug via INTRAVENOUS

## 2021-07-23 MED ORDER — FENTANYL CITRATE (PF) 100 MCG/2ML IJ SOLN
INTRAMUSCULAR | Status: AC
  Filled 2021-07-23: qty 2

## 2021-07-23 MED ORDER — CELECOXIB 200 MG PO CAPS
200.0000 mg | ORAL_CAPSULE | Freq: Once | ORAL | Status: AC
  Administered 2021-07-23: 07:00:00 200 mg via ORAL
  Filled 2021-07-23: qty 1

## 2021-07-23 MED ORDER — ONDANSETRON HCL 4 MG/2ML IJ SOLN
INTRAMUSCULAR | Status: DC | PRN
  Administered 2021-07-23: 4 mg via INTRAVENOUS

## 2021-07-23 MED ORDER — LIDOCAINE 2% (20 MG/ML) 5 ML SYRINGE
INTRAMUSCULAR | Status: DC | PRN
  Administered 2021-07-23: 40 mg via INTRAVENOUS

## 2021-07-23 SURGICAL SUPPLY — 62 items
BAG COUNTER SPONGE SURGICOUNT (BAG) ×2 IMPLANT
BAG SURGICOUNT SPONGE COUNTING (BAG) ×1
BANDAGE ESMARK 6X9 LF (GAUZE/BANDAGES/DRESSINGS) ×1 IMPLANT
BNDG COHESIVE 6X5 TAN STRL LF (GAUZE/BANDAGES/DRESSINGS) ×1 IMPLANT
BNDG ELASTIC 4X5.8 VLCR STR LF (GAUZE/BANDAGES/DRESSINGS) ×3 IMPLANT
BNDG ELASTIC 6X5.8 VLCR STR LF (GAUZE/BANDAGES/DRESSINGS) ×1 IMPLANT
BNDG ESMARK 6X9 LF (GAUZE/BANDAGES/DRESSINGS)
BNDG GAUZE ELAST 4 BULKY (GAUZE/BANDAGES/DRESSINGS) ×2 IMPLANT
BRUSH SCRUB EZ PLAIN DRY (MISCELLANEOUS) ×6 IMPLANT
CHLORAPREP W/TINT 26 (MISCELLANEOUS) ×3 IMPLANT
CLOSURE WOUND 1/2 X4 (GAUZE/BANDAGES/DRESSINGS)
COVER SURGICAL LIGHT HANDLE (MISCELLANEOUS) ×6 IMPLANT
CUFF TOURN SGL QUICK 18X4 (TOURNIQUET CUFF) IMPLANT
CUFF TOURN SGL QUICK 24 (TOURNIQUET CUFF)
CUFF TOURN SGL QUICK 34 (TOURNIQUET CUFF)
CUFF TRNQT CYL 24X4X16.5-23 (TOURNIQUET CUFF) IMPLANT
CUFF TRNQT CYL 34X4.125X (TOURNIQUET CUFF) IMPLANT
DRAPE C-ARM 42X72 X-RAY (DRAPES) IMPLANT
DRAPE C-ARMOR (DRAPES) ×3 IMPLANT
DRAPE U-SHAPE 47X51 STRL (DRAPES) ×3 IMPLANT
DRSG ADAPTIC 3X8 NADH LF (GAUZE/BANDAGES/DRESSINGS) ×3 IMPLANT
ELECT REM PT RETURN 9FT ADLT (ELECTROSURGICAL) ×3
ELECTRODE REM PT RTRN 9FT ADLT (ELECTROSURGICAL) ×1 IMPLANT
GAUZE SPONGE 4X4 12PLY STRL (GAUZE/BANDAGES/DRESSINGS) ×3 IMPLANT
GLOVE SURG ENC MOIS LTX SZ6.5 (GLOVE) ×9 IMPLANT
GLOVE SURG ENC MOIS LTX SZ7.5 (GLOVE) ×12 IMPLANT
GLOVE SURG UNDER POLY LF SZ6.5 (GLOVE) ×3 IMPLANT
GLOVE SURG UNDER POLY LF SZ7.5 (GLOVE) ×3 IMPLANT
GOWN STRL REUS W/ TWL LRG LVL3 (GOWN DISPOSABLE) ×2 IMPLANT
GOWN STRL REUS W/TWL LRG LVL3 (GOWN DISPOSABLE) ×4
KIT BASIN OR (CUSTOM PROCEDURE TRAY) ×3 IMPLANT
KIT TURNOVER KIT B (KITS) ×3 IMPLANT
MANIFOLD NEPTUNE II (INSTRUMENTS) ×3 IMPLANT
NEEDLE 22X1 1/2 (OR ONLY) (NEEDLE) IMPLANT
NS IRRIG 1000ML POUR BTL (IV SOLUTION) ×3 IMPLANT
PACK ORTHO EXTREMITY (CUSTOM PROCEDURE TRAY) ×3 IMPLANT
PAD ARMBOARD 7.5X6 YLW CONV (MISCELLANEOUS) ×6 IMPLANT
PAD CAST 4YDX4 CTTN HI CHSV (CAST SUPPLIES) IMPLANT
PADDING CAST COTTON 4X4 STRL (CAST SUPPLIES) ×2
PADDING CAST COTTON 6X4 STRL (CAST SUPPLIES) ×3 IMPLANT
SPONGE T-LAP 18X18 ~~LOC~~+RFID (SPONGE) ×3 IMPLANT
STAPLER VISISTAT 35W (STAPLE) IMPLANT
STOCKINETTE IMPERVIOUS LG (DRAPES) ×3 IMPLANT
STRIP CLOSURE SKIN 1/2X4 (GAUZE/BANDAGES/DRESSINGS) IMPLANT
SUCTION FRAZIER HANDLE 10FR (MISCELLANEOUS)
SUCTION TUBE FRAZIER 10FR DISP (MISCELLANEOUS) IMPLANT
SUT ETHILON 3 0 PS 1 (SUTURE) ×2 IMPLANT
SUT MNCRL AB 3-0 PS2 18 (SUTURE) ×1 IMPLANT
SUT MON AB 2-0 CT1 36 (SUTURE) ×1 IMPLANT
SUT PDS AB 2-0 CT1 27 (SUTURE) IMPLANT
SUT VIC AB 0 CT1 27 (SUTURE)
SUT VIC AB 0 CT1 27XBRD ANBCTR (SUTURE) IMPLANT
SUT VIC AB 2-0 CT1 27 (SUTURE)
SUT VIC AB 2-0 CT1 TAPERPNT 27 (SUTURE) IMPLANT
SYR CONTROL 10ML LL (SYRINGE) IMPLANT
TOWEL GREEN STERILE (TOWEL DISPOSABLE) ×6 IMPLANT
TOWEL GREEN STERILE FF (TOWEL DISPOSABLE) ×6 IMPLANT
TUBE CONNECTING 12'X1/4 (SUCTIONS) ×1
TUBE CONNECTING 12X1/4 (SUCTIONS) ×2 IMPLANT
UNDERPAD 30X36 HEAVY ABSORB (UNDERPADS AND DIAPERS) ×3 IMPLANT
WATER STERILE IRR 1000ML POUR (IV SOLUTION) ×6 IMPLANT
YANKAUER SUCT BULB TIP NO VENT (SUCTIONS) ×3 IMPLANT

## 2021-07-23 NOTE — Interval H&P Note (Signed)
History and Physical Interval Note:  07/23/2021 8:19 AM  Molly Dennis  has presented today for surgery, with the diagnosis of Painful hardware.  The various methods of treatment have been discussed with the patient and family. After consideration of risks, benefits and other options for treatment, the patient has consented to  Procedure(s): HARDWARE REMOVAL RIGHT FOOT (Right) as a surgical intervention.  The patient's history has been reviewed, patient examined, no change in status, stable for surgery.  I have reviewed the patient's chart and labs.  Questions were answered to the patient's satisfaction.     Caryn Bee P Aniyah Nobis

## 2021-07-23 NOTE — Transfer of Care (Signed)
Immediate Anesthesia Transfer of Care Note  Patient: Molly Dennis  Procedure(s) Performed: HARDWARE REMOVAL RIGHT FOOT (Right)  Patient Location: PACU  Anesthesia Type:General  Level of Consciousness: drowsy  Airway & Oxygen Therapy: Patient Spontanous Breathing and Patient connected to face mask oxygen  Post-op Assessment: Report given to RN and Post -op Vital signs reviewed and stable  Post vital signs: Reviewed and stable  Last Vitals:  Vitals Value Taken Time  BP 110/74 07/23/21 0934  Temp    Pulse 83 07/23/21 0934  Resp 10 07/23/21 0934  SpO2 100 % 07/23/21 0934  Vitals shown include unvalidated device data.  Last Pain:  Vitals:   07/23/21 0706  TempSrc:   PainSc: 0-No pain      Patients Stated Pain Goal: 0 (07/23/21 0706)  Complications: No notable events documented.

## 2021-07-23 NOTE — Progress Notes (Signed)
Patient unable to remove ring and requesting it to be cut off. Cut ring off per request and placed in patient's belonging bag.

## 2021-07-23 NOTE — Discharge Instructions (Addendum)
Orthopaedic Trauma Service Discharge Instructions   General Discharge Instructions  WEIGHT BEARING STATUS: weightbearing as tolerated   RANGE OF MOTION/ACTIVITY: Ok for ankle range of motion as tolerated  Wound Care: You may remove your surgical dressing on post-op day #2 (Friday 07/25/21). Continue current dressing changes every other day with KY jelly, adaptic, gauze, kerlix per previous plastic surgery instructions. Pay resume showering as you were previsously  DVT/PE prophylaxis: None  Diet: as you were eating previously.  Can use over the counter stool softeners and bowel preparations, such as Miralax, to help with bowel movements.  Narcotics can be constipating.  Be sure to drink plenty of fluids  PAIN MEDICATION USE AND EXPECTATIONS  You have likely been given narcotic medications to help control your pain.  After a traumatic event that results in an fracture (broken bone) with or without surgery, it is ok to use narcotic pain medications to help control one's pain.  We understand that everyone responds to pain differently and each individual patient will be evaluated on a regular basis for the continued need for narcotic medications. Ideally, narcotic medication use should last no more than 6-8 weeks (coinciding with fracture healing).   As a patient it is your responsibility as well to monitor narcotic medication use and report the amount and frequency you use these medications when you come to your office visit.   We would also advise that if you are using narcotic medications, you should take a dose prior to therapy to maximize you participation.  IF YOU ARE ON NARCOTIC MEDICATIONS IT IS NOT PERMISSIBLE TO OPERATE A MOTOR VEHICLE (MOTORCYCLE/CAR/TRUCK/MOPED) OR HEAVY MACHINERY DO NOT MIX NARCOTICS WITH OTHER CNS (CENTRAL NERVOUS SYSTEM) DEPRESSANTS SUCH AS ALCOHOL   STOP SMOKING OR USING NICOTINE PRODUCTS!!!!  As discussed nicotine severely impairs your body's ability to heal  surgical and traumatic wounds but also impairs bone healing.  Wounds and bone heal by forming microscopic blood vessels (angiogenesis) and nicotine is a vasoconstrictor (essentially, shrinks blood vessels).  Therefore, if vasoconstriction occurs to these microscopic blood vessels they essentially disappear and are unable to deliver necessary nutrients to the healing tissue.  This is one modifiable factor that you can do to dramatically increase your chances of healing your injury.    (This means no smoking, no nicotine gum, patches, etc)  DO NOT USE NONSTEROIDAL ANTI-INFLAMMATORY DRUGS (NSAID'S)  Using products such as Advil (ibuprofen), Aleve (naproxen), Motrin (ibuprofen) for additional pain control during fracture healing can delay and/or prevent the healing response.  If you would like to take over the counter (OTC) medication, Tylenol (acetaminophen) is ok.  However, some narcotic medications that are given for pain control contain acetaminophen as well. Therefore, you should not exceed more than 4000 mg of tylenol in a day if you do not have liver disease.  Also note that there are may OTC medicines, such as cold medicines and allergy medicines that my contain tylenol as well.  If you have any questions about medications and/or interactions please ask your doctor/PA or your pharmacist.      ICE AND ELEVATE INJURED/OPERATIVE EXTREMITY  Using ice and elevating the injured extremity above your heart can help with swelling and pain control.  Icing in a pulsatile fashion, such as 20 minutes on and 20 minutes off, can be followed.    Do not place ice directly on skin. Make sure there is a barrier between to skin and the ice pack.    Using frozen items such  as frozen peas works well as the conform nicely to the are that needs to be iced.  USE AN ACE WRAP OR TED HOSE FOR SWELLING CONTROL  In addition to icing and elevation, Ace wraps or TED hose are used to help limit and resolve swelling.  It is  recommended to use Ace wraps or TED hose until you are informed to stop.    When using Ace Wraps start the wrapping distally (farthest away from the body) and wrap proximally (closer to the body)   Example: If you had surgery on your leg or thing and you do not have a splint on, start the ace wrap at the toes and work your way up to the thigh        If you had surgery on your upper extremity and do not have a splint on, start the ace wrap at your fingers and work your way up to the upper arm   CALL THE OFFICE WITH ANY QUESTIONS OR CONCERNS: [phone]   VISIT OUR WEBSITE FOR ADDITIONAL INFORMATION: [email or web address]

## 2021-07-23 NOTE — Anesthesia Preprocedure Evaluation (Signed)
Anesthesia Evaluation  Patient identified by MRN, date of birth, ID band Patient awake    Reviewed: Allergy & Precautions, NPO status , Patient's Chart, lab work & pertinent test results  Airway Mallampati: II  TM Distance: >3 FB Neck ROM: Full    Dental   Pulmonary asthma , COPD, Current Smoker and Patient abstained from smoking.,    breath sounds clear to auscultation       Cardiovascular negative cardio ROS   Rhythm:Regular Rate:Normal     Neuro/Psych Seizures -,  CVA    GI/Hepatic negative GI ROS, Neg liver ROS,   Endo/Other  negative endocrine ROS  Renal/GU negative Renal ROS     Musculoskeletal   Abdominal   Peds  Hematology negative hematology ROS (+)   Anesthesia Other Findings   Reproductive/Obstetrics                             Lab Results  Component Value Date   WBC 12.8 (H) 02/04/2021   HGB 9.0 (L) 02/04/2021   HCT 28.1 (L) 02/04/2021   MCV 94.3 02/04/2021   PLT 497 (H) 02/04/2021   Lab Results  Component Value Date   CREATININE 0.59 02/02/2021   BUN 18 02/02/2021   NA 140 02/02/2021   K 3.7 02/02/2021   CL 103 02/02/2021   CO2 29 02/02/2021    Anesthesia Physical Anesthesia Plan  ASA: 3  Anesthesia Plan: General   Post-op Pain Management: Tylenol PO (pre-op) and Celebrex PO (pre-op)   Induction: Intravenous  PONV Risk Score and Plan: 2 and Dexamethasone, Ondansetron and Treatment may vary due to age or medical condition  Airway Management Planned: LMA  Additional Equipment: None  Intra-op Plan:   Post-operative Plan: Extubation in OR  Informed Consent: I have reviewed the patients History and Physical, chart, labs and discussed the procedure including the risks, benefits and alternatives for the proposed anesthesia with the patient or authorized representative who has indicated his/her understanding and acceptance.     Dental advisory  given  Plan Discussed with: CRNA  Anesthesia Plan Comments:         Anesthesia Quick Evaluation

## 2021-07-23 NOTE — Anesthesia Procedure Notes (Signed)
Procedure Name: LMA Insertion Date/Time: 07/23/2021 8:59 AM Performed by: Katina Degree, CRNA Pre-anesthesia Checklist: Patient identified, Emergency Drugs available, Suction available and Patient being monitored Patient Re-evaluated:Patient Re-evaluated prior to induction Oxygen Delivery Method: Circle system utilized Preoxygenation: Pre-oxygenation with 100% oxygen Induction Type: IV induction Ventilation: Mask ventilation without difficulty LMA: LMA inserted LMA Size: 4.0 Number of attempts: 1 Placement Confirmation: ETT inserted through vocal cords under direct vision, positive ETCO2 and breath sounds checked- equal and bilateral Tube secured with: Tape Dental Injury: Teeth and Oropharynx as per pre-operative assessment

## 2021-07-23 NOTE — Op Note (Signed)
Orthopaedic Surgery Operative Note (CSN: [id number] ) Date of Surgery: 07/23/2021  Admit Date: 07/23/2021   Diagnoses: Pre-Op Diagnoses: Painful right foot hardware  Post-Op Diagnosis: Same  Procedures: CPT 20680-Removal of hardware right calcaneus  Surgeons : Primary: Roby Lofts, MD  Assistant: Ulyses Southward, PA-C  Location: OR 7   Anesthesia:General   Antibiotics: Ancef 2g preop   Tourniquet time:None    Estimated Blood Loss:5 mL  Complications:None  Specimens:None  Implants: * No implants in log *   Indications for Surgery: 50 year old female who sustained multiple injuries to her right lower extremity.  She had an open calcaneus fracture which required I&D and percutaneous fixation of her tuberosity.  She had gone on to heal her fracture and had some soft tissue issues for which she has been seen by plastic surgery.  She was attempting to try to put weight on her leg but unfortunately she had significant pain location at the plantar aspect of the foot which seem to be location of her screws.  As result I felt that screw removal would be appropriate.  Risks and benefits were discussed with the patient.  She agreed to proceed with surgery and consent was obtained.  Operative Findings: Successful removal of right calcaneal screws without complication.  Procedure: The patient was identified in the preoperative holding area. Consent was confirmed with the patient and their family and all questions were answered. The operative extremity was marked after confirmation with the patient. she was then brought back to the operating room by our anesthesia colleagues.  She was carefully transferred to a radiolucent flat top table.  She was placed under general anesthetic.  Her right lower extremity was prepped and draped in usual sterile fashion.  A timeout was performed to verify the patient, the procedure, and the extremity.  Preoperative antibiotics were dosed.  Fluoroscopic  imaging was used to identify the screw heads.  I then percutaneously placed 2.8 mm guidewire through the cannulation of the screws.  I then cut down on these guidewires and removed each of the screws without difficulty.  Final fluoroscopic imaging showed no retained hardware in the calcaneus.  The incisions were then copiously irrigated.  They were then closed with 3-0 nylon suture.  Sterile dressing was applied.  The patient was then awoken from anesthesia and taken to the PACU in stable condition.   Post Op Plan/Instructions: Patient will be nonweightbearing to the right lower extremity.  She does not need any DVT prophylaxis due to the ambulatory nature of her surgery.  She will follow-up in 2 weeks for suture removal and x-rays.  I was present and performed the entire surgery.  Ulyses Southward, PA-C did assist me throughout the case. An assistant was necessary given the difficulty in approach, maintenance of reduction and ability to instrument the fracture.   Truitt Merle, MD [facility]

## 2021-07-24 ENCOUNTER — Encounter (HOSPITAL_COMMUNITY): Payer: Self-pay | Admitting: Student

## 2021-07-24 NOTE — Anesthesia Postprocedure Evaluation (Signed)
Anesthesia Post Note  Patient: Molly Dennis  Procedure(s) Performed: HARDWARE REMOVAL RIGHT FOOT (Right)     Patient location during evaluation: PACU Anesthesia Type: General Level of consciousness: awake and alert Pain management: pain level controlled Vital Signs Assessment: post-procedure vital signs reviewed and stable Respiratory status: spontaneous breathing, nonlabored ventilation, respiratory function stable and patient connected to nasal cannula oxygen Cardiovascular status: blood pressure returned to baseline and stable Postop Assessment: no apparent nausea or vomiting Anesthetic complications: no   No notable events documented.  Last Vitals:  Vitals:   07/23/21 0947 07/23/21 1002  BP: (!) 112/59 112/85  Pulse: 91 87  Resp: 14 12  Temp:  (!) 36.2 C  SpO2: 96% 94%    Last Pain:  Vitals:   07/23/21 1002  TempSrc:   PainSc: 0-No pain                 Kennieth Rad

## 2021-08-11 NOTE — Progress Notes (Signed)
Referring Provider Clinic, [facility],  [place] [address]   CC:  Chief Complaint  Patient presents with   Skin Problem      Molly Dennis is an 51 y.o. female.  HPI:  Patient is a 51 year old female s/p right heel debridement with placement of ACell performed by Dr. Marla Roe 05/21/2021 who presents to clinic for postoperative follow-up.  Patient was last seen here in clinic on 07/22/2021.  At that time, she was about to have surgery with Dr. Doreatha Martin to remove hardware in her right foot the following day.  She had reported continued improvement, specifically with regard to her redness and swelling.  She continued to apply collagen dressing changes regularly.  Wound is approximately 7 x 6 x 0.5 cm with good granular base.  There was some hardened exudate along periphery of wound that was debrided at bedside without difficulty or complication.  Plan was for continued dressing changes regularly.  Today, she states that she has been discharged home from her facility.  She saw Dr. Doreatha Martin, her orthopedist, last week.  She has had her hardware removed without complication.  She feels a hard "bump" over plantar aspect of right foot that she thinks may be scar tissue versus bone healing from her calcaneal fracture.  It has been mildly bothersome.  She denies any fevers, redness, or other symptoms.  Patient reports that she has been cleaning her wound with Dakin's and wrapping with Xeroform every other day.  No Known Allergies  Outpatient Encounter Medications as of 08/14/2021  Medication Sig Note   albuterol (VENTOLIN HFA) 108 (90 Base) MCG/ACT inhaler Inhale 1-2 puffs into the lungs every 6 (six) hours as needed for wheezing.    amitriptyline (ELAVIL) 50 MG tablet Take 50 mg by mouth at bedtime.    ARIPiprazole (ABILIFY) 10 MG tablet Take 10 mg by mouth in the morning.    atomoxetine (STRATTERA) 60 MG capsule Take 60 mg by mouth every morning. 01/25/2021:  Needs a refill   atorvastatin (LIPITOR) 40 MG tablet Take 40 mg by mouth daily.    calcium carbonate (TUMS EX) 750 MG chewable tablet Chew 1 tablet by mouth every 4 (four) hours as needed for heartburn.    diclofenac Sodium (VOLTAREN) 1 % GEL Apply 1 application topically 4 (four) times daily as needed (right knee pain).    DULoxetine (CYMBALTA) 60 MG capsule Take 60 mg by mouth daily.    fluticasone-salmeterol (ADVAIR) 100-50 MCG/ACT AEPB Inhale 1 puff into the lungs 2 (two) times daily.    gabapentin (NEURONTIN) 300 MG capsule Take 300 mg by mouth 3 (three) times daily.    guaiFENesin (ROBITUSSIN) 100 MG/5ML SOLN Take 10 mLs (200 mg total) by mouth every 4 (four) hours as needed for cough or to loosen phlegm.    levETIRAcetam (KEPPRA) 500 MG tablet Take 1 tablet (500 mg total) by mouth 2 (two) times daily. (Patient taking differently: Take 500 mg by mouth at bedtime.)    loperamide (IMODIUM) 2 MG capsule Take 2 mg by mouth every 4 (four) hours as needed for diarrhea or loose stools.    oxyCODONE (ROXICODONE) 5 MG immediate release tablet Take 1 tablet (5 mg total) by mouth every 6 (six) hours as needed for severe pain.    pantoprazole (PROTONIX) 20 MG tablet Take 20 mg by mouth daily.    QUEtiapine (SEROQUEL) 400 MG tablet Take 800 mg by mouth at bedtime.    tamsulosin (FLOMAX) 0.4  MG CAPS capsule Take 1 capsule (0.4 mg total) by mouth daily.    No facility-administered encounter medications on file as of 08/14/2021.     Past Medical History:  Diagnosis Date   Anxiety    Asthma    COPD (chronic obstructive pulmonary disease) ([place])    Depression    Difficult intravenous access    Seizures ([place])    Smoker    Stroke [facility])     Past Surgical History:  Procedure Laterality Date   DEBRIDEMENT AND CLOSURE WOUND Right 05/21/2021   Procedure: Debridement and excision of right heel wound;  Surgeon: Wallace Going, DO;  Location: [facility];  Service: Plastics;  Laterality:  Right;   FOOT SURGERY     HARDWARE REMOVAL Right 07/23/2021   Procedure: HARDWARE REMOVAL RIGHT FOOT;  Surgeon: Shona Needles, MD;  Location: [facility];  Service: Orthopedics;  Laterality: Right;   I & D EXTREMITY Right 01/25/2021   Procedure: IRRIGATION AND DEBRIDEMENT RIGHT TIBIAL/FIBULA,, CALCANEUS;  Surgeon: Rod Can, MD;  Location: [place];  Service: Orthopedics;  Laterality: Right;   ORIF CALCANEOUS FRACTURE Right 01/27/2021   Procedure: OPEN REDUCTION INTERNAL FIXATION (ORIF) CALCANEOUS FRACTURE;  Surgeon: Shona Needles, MD;  Location: [place];  Service: Orthopedics;  Laterality: Right;   ORIF TIBIA PLATEAU Right 01/27/2021   Procedure: OPEN REDUCTION INTERNAL FIXATION (ORIF) TIBIAL PLATEAU;  Surgeon: Shona Needles, MD;  Location: [facility];  Service: Orthopedics;  Laterality: Right;   SHOULDER SURGERY     TIBIA IM NAIL INSERTION Right 01/27/2021   Procedure: INTRAMEDULLARY (IM) NAIL TIBIAL;  Surgeon: Shona Needles, MD;  Location: [facility];  Service: Orthopedics;  Laterality: Right;    No family history on file.  Social History   Social History Narrative   ** Merged History Encounter **         Review of Systems General: Denies fevers or chills MSK: Denies any new or worsening swelling Skin: Endorses itchy yellow crusting along periphery of wound  Physical Exam Vitals with BMI 07/23/2021 07/23/2021 07/23/2021  Height - - -  Weight - - -  BMI - - -  Systolic [id number]  Diastolic 85 59 74  Pulse 87 91 83    General:  No acute distress, nontoxic appearing  Respiratory: No increased work of breathing Neuro: Alert and oriented Psychiatric: Normal mood and affect  Skin: 5.5 x 5.5 cm wound noted, excellent granular base.  Continued advancing epithelization compared to prior clinic pictures.  No surrounding erythema or induration.  No purulent drainage.  Appears clean.  Assessment/Plan  Wound with continued healing.  Do not feel as though surgical intervention will be  needed anytime soon.  She can either proceed with outpatient wound care center or continue with seeing [place] every 3 to 4 weeks as needed for wound check.  I had started her on collagen dressing changes last month, but somehow her management changed at the nursing facility and she was transitioned to Dakin's.  I told her that we can discontinue the Dakin's and return to collagen dressing changes given her good granular base to help expedite her epithelialization.  Provided note for her home health agency to provide collagen followed by thin film K-Y jelly, Xeroform gauze, 4 x 4 gauze, and secured with Kerlix and Ace bandage.  She feels comfortable with dressing changes every other day which is fine.  Picture(s) obtained of the patient and placed in the chart were with the  patient's or guardian's permission.   [name] 08/14/2021, 9:37 AM

## 2021-08-14 ENCOUNTER — Ambulatory Visit (INDEPENDENT_AMBULATORY_CARE_PROVIDER_SITE_OTHER): Payer: Commercial Managed Care - HMO | Admitting: Physician Assistant

## 2021-08-14 ENCOUNTER — Other Ambulatory Visit: Payer: Self-pay

## 2021-08-14 DIAGNOSIS — Z9889 Other specified postprocedural states: Secondary | ICD-10-CM | POA: Diagnosis not present

## 2021-08-20 ENCOUNTER — Telehealth: Payer: Self-pay | Admitting: Plastic Surgery

## 2021-08-20 NOTE — Telephone Encounter (Signed)
I guess she did not pass along the written instructions I had given her at her last appointment.  Oh well.   Recommended collagen every other day followed by thin film K-Y jelly, Xeroform gauze, 4 x 4 gauze, and secured with Kerlix and Ace bandage. Thank you both!

## 2021-08-20 NOTE — Telephone Encounter (Signed)
Molly Dennis with [facility] called and left voicemail that they need orders for continued wound care for patient. They said if someone can call back with verbal orders at [phone] or if orders can be faxed to [phone].  Please follow up

## 2021-08-20 NOTE — Telephone Encounter (Signed)
Called Cheryl back, na, left vm with Garrett's recommendations.

## 2021-09-01 NOTE — Progress Notes (Deleted)
Referring Provider Clinic, [facility] [place],  [place] [address]   CC: No chief complaint on file.     Molly Dennis is an 51 y.o. female.  HPI: Patient is a 51 year old female s/p right heel debridement with placement of ACell performed by Dr. Marla Roe 05/21/2021 who presents to clinic for postoperative follow-up.  Patient was last seen here in clinic 08/14/2021.  At that time, she had been discharged home from our facility.  She was cleaning the wound with Dakin's and wrapping with Xeroform every other day.  Physical exam was reassuring.  Recommended that she transition back to collagen dressing changes to help expedite epithelialization.  Provided note for her home health agency with written instructions.  Today  No Known Allergies  Outpatient Encounter Medications as of 09/04/2021  Medication Sig Note   albuterol (VENTOLIN HFA) 108 (90 Base) MCG/ACT inhaler Inhale 1-2 puffs into the lungs every 6 (six) hours as needed for wheezing.    amitriptyline (ELAVIL) 50 MG tablet Take 50 mg by mouth at bedtime.    ARIPiprazole (ABILIFY) 10 MG tablet Take 10 mg by mouth in the morning.    atomoxetine (STRATTERA) 60 MG capsule Take 60 mg by mouth every morning. 01/25/2021: Needs a refill   atorvastatin (LIPITOR) 40 MG tablet Take 40 mg by mouth daily.    calcium carbonate (TUMS EX) 750 MG chewable tablet Chew 1 tablet by mouth every 4 (four) hours as needed for heartburn.    diclofenac Sodium (VOLTAREN) 1 % GEL Apply 1 application topically 4 (four) times daily as needed (right knee pain).    DULoxetine (CYMBALTA) 60 MG capsule Take 60 mg by mouth daily.    fluticasone-salmeterol (ADVAIR) 100-50 MCG/ACT AEPB Inhale 1 puff into the lungs 2 (two) times daily.    gabapentin (NEURONTIN) 300 MG capsule Take 300 mg by mouth 3 (three) times daily.    guaiFENesin (ROBITUSSIN) 100 MG/5ML SOLN Take 10 mLs (200 mg total) by mouth every 4 (four) hours as needed for cough or  to loosen phlegm.    levETIRAcetam (KEPPRA) 500 MG tablet Take 1 tablet (500 mg total) by mouth 2 (two) times daily. (Patient taking differently: Take 500 mg by mouth at bedtime.)    loperamide (IMODIUM) 2 MG capsule Take 2 mg by mouth every 4 (four) hours as needed for diarrhea or loose stools.    oxyCODONE (ROXICODONE) 5 MG immediate release tablet Take 1 tablet (5 mg total) by mouth every 6 (six) hours as needed for severe pain.    pantoprazole (PROTONIX) 20 MG tablet Take 20 mg by mouth daily.    QUEtiapine (SEROQUEL) 400 MG tablet Take 800 mg by mouth at bedtime.    tamsulosin (FLOMAX) 0.4 MG CAPS capsule Take 1 capsule (0.4 mg total) by mouth daily.    No facility-administered encounter medications on file as of 09/04/2021.     Past Medical History:  Diagnosis Date   Anxiety    Asthma    COPD (chronic obstructive pulmonary disease) ([place])    Depression    Difficult intravenous access    Seizures ([place])    Smoker    Stroke [facility])     Past Surgical History:  Procedure Laterality Date   DEBRIDEMENT AND CLOSURE WOUND Right 05/21/2021   Procedure: Debridement and excision of right heel wound;  Surgeon: Wallace Going, DO;  Location: [facility];  Service: Plastics;  Laterality: Right;   FOOT SURGERY  HARDWARE REMOVAL Right 07/23/2021   Procedure: HARDWARE REMOVAL RIGHT FOOT;  Surgeon: Shona Needles, MD;  Location: [facility];  Service: Orthopedics;  Laterality: Right;   I & D EXTREMITY Right 01/25/2021   Procedure: IRRIGATION AND DEBRIDEMENT RIGHT TIBIAL/FIBULA,, CALCANEUS;  Surgeon: Rod Can, MD;  Location: [facility];  Service: Orthopedics;  Laterality: Right;   ORIF CALCANEOUS FRACTURE Right 01/27/2021   Procedure: OPEN REDUCTION INTERNAL FIXATION (ORIF) CALCANEOUS FRACTURE;  Surgeon: Shona Needles, MD;  Location: [facility];  Service: Orthopedics;  Laterality: Right;   ORIF TIBIA PLATEAU Right 01/27/2021   Procedure: OPEN REDUCTION INTERNAL FIXATION (ORIF) TIBIAL  PLATEAU;  Surgeon: Shona Needles, MD;  Location: [place];  Service: Orthopedics;  Laterality: Right;   SHOULDER SURGERY     TIBIA IM NAIL INSERTION Right 01/27/2021   Procedure: INTRAMEDULLARY (IM) NAIL TIBIAL;  Surgeon: Shona Needles, MD;  Location: [place];  Service: Orthopedics;  Laterality: Right;    No family history on file.  Social History   Social History Narrative   ** Merged History Encounter **         Review of Systems General: Denies fevers or chills Cardio: Denies chest pain Pulmonary: Denies difficulty breathing  Physical Exam Vitals with BMI 07/23/2021 07/23/2021 07/23/2021  Height - - -  Weight - - -  BMI - - -  Systolic [id number]  Diastolic 85 59 74  Pulse 87 91 83    General:  No acute distress, nontoxic appearing  Respiratory: No increased work of breathing Neuro: Alert and oriented Psychiatric: Normal mood and affect   Assessment/Plan ***  Molly Dennis 09/01/2021, 10:25 AM

## 2021-09-04 ENCOUNTER — Ambulatory Visit: Payer: Commercial Managed Care - HMO | Admitting: Physician Assistant

## 2021-09-05 ENCOUNTER — Emergency Department
Admission: EM | Admit: 2021-09-05 | Discharge: 2021-09-05 | Disposition: A | Discharge status: Home / Self Care | Payer: Medicare Other | Attending: Emergency Medicine | Admitting: Emergency Medicine

## 2021-09-05 ENCOUNTER — Other Ambulatory Visit: Payer: Self-pay

## 2021-09-05 ENCOUNTER — Encounter: Payer: Self-pay | Admitting: Emergency Medicine

## 2021-09-05 DIAGNOSIS — R569 Unspecified convulsions: Secondary | ICD-10-CM

## 2021-09-05 DIAGNOSIS — G40909 Epilepsy, unspecified, not intractable, without status epilepticus: Secondary | ICD-10-CM | POA: Diagnosis present

## 2021-09-05 LAB — CBC WITH DIFFERENTIAL/PLATELET
Abs Immature Granulocytes: 0.08 10*3/uL — ABNORMAL HIGH (ref 0.00–0.07)
Basophils Absolute: 0 10*3/uL (ref 0.0–0.1)
Basophils Relative: 0 %
Eosinophils Absolute: 0.1 10*3/uL (ref 0.0–0.5)
Eosinophils Relative: 1 %
HCT: 40.8 % (ref 36.0–46.0)
Hemoglobin: 13.5 g/dL (ref 12.0–15.0)
Immature Granulocytes: 1 %
Lymphocytes Relative: 19 %
Lymphs Abs: 2.2 10*3/uL (ref 0.7–4.0)
MCH: 29.2 pg (ref 26.0–34.0)
MCHC: 33.1 g/dL (ref 30.0–36.0)
MCV: 88.1 fL (ref 80.0–100.0)
Monocytes Absolute: 0.7 10*3/uL (ref 0.1–1.0)
Monocytes Relative: 6 %
Neutro Abs: 8.5 10*3/uL — ABNORMAL HIGH (ref 1.7–7.7)
Neutrophils Relative %: 73 %
Platelets: 369 10*3/uL (ref 150–400)
RBC: 4.63 MIL/uL (ref 3.87–5.11)
RDW: 12.7 % (ref 11.5–15.5)
WBC: 11.6 10*3/uL — ABNORMAL HIGH (ref 4.0–10.5)
nRBC: 0 % (ref 0.0–0.2)

## 2021-09-05 MED ORDER — LEVETIRACETAM IN NACL 1000 MG/100ML IV SOLN
1000.0000 mg | INTRAVENOUS | Status: AC
  Administered 2021-09-05: 1000 mg via INTRAVENOUS
  Filled 2021-09-05: qty 100

## 2021-09-05 MED ORDER — PANTOPRAZOLE SODIUM 40 MG IV SOLR
40.0000 mg | Freq: Once | INTRAVENOUS | Status: AC
Start: 2021-09-05 — End: 2021-09-05
  Administered 2021-09-05: 40 mg via INTRAVENOUS
  Filled 2021-09-05: qty 40

## 2021-09-05 MED ORDER — SODIUM CHLORIDE 0.9 % IV BOLUS
1000.0000 mL | Freq: Once | INTRAVENOUS | Status: AC
  Administered 2021-09-05: 1000 mL via INTRAVENOUS

## 2021-09-05 NOTE — ED Notes (Signed)
Report received from [place], [place]. Patient resting comfortably on stretcher. RR even and unlabored. Patient is asking when she might be discharged. Will speak with provider. Patient has no other complaints at this time.

## 2021-09-05 NOTE — ED Provider Notes (Signed)
[facility] Provider Note    Event Date/Time   First MD Initiated Contact with Patient 09/05/21 1633     (approximate)   History   Seizures   HPI  Molly Dennis is a 51 y.o. female with a history of alcohol abuse, epilepsy on twice daily Keppra who is brought to the ED from a dollar store due to a seizure.  Patient reports that prior to this, she has been having some episodes of vomiting for the last 2 days which she attributes to her acid reflux.  She states that she induces the vomiting because it helps her stomach feel better.  She is eating and drinking normally.  No black or bloody stool.  No fever.  No trauma.  Currently she states that she is feeling fine.  She has been compliant with her medications but has not yet taken her Keppra today     Physical Exam   Triage Vital Signs: ED Triage Vitals  Enc Vitals Group     BP 09/05/21 1627 112/83     Pulse Rate 09/05/21 1627 95     Resp 09/05/21 1627 18     Temp 09/05/21 1627 98 F (36.7 C)     Temp Source 09/05/21 1627 Oral     SpO2 09/05/21 1627 95 %     Weight 09/05/21 1628 175 lb (79.4 kg)     Height 09/05/21 1628 5' 5.5" (1.664 m)     Head Circumference --      Peak Flow --      Pain Score 09/05/21 1628 0     Pain Loc --      Pain Edu? --      Excl. in GC? --     Most recent vital signs: Vitals:   09/05/21 1800 09/05/21 1830  BP: (!) 120/92 110/79  Pulse: 92 91  Resp:  18  Temp:    SpO2: 96% 94%     General: Awake, no distress.  CV:  Good peripheral perfusion.  Regular rate and rhythm Resp:  Normal effort.  Clear to auscultation bilaterally Abd:  No distention.  Soft and nontender.  Hemoccult negative Other:  No signs of head trauma.  No spinal tenderness.  No musculoskeletal injuries.  Slurred speech but interactive and following commands.  Patient states this is her baseline speech.   ED Results / Procedures / Treatments   Labs (all labs ordered are listed, but only  abnormal results are displayed) Labs Reviewed  CBC WITH DIFFERENTIAL/PLATELET - Abnormal; Notable for the following components:      Result Value   WBC 11.6 (*)    Neutro Abs 8.5 (*)    Abs Immature Granulocytes 0.08 (*)    All other components within normal limits  URINALYSIS, ROUTINE W REFLEX MICROSCOPIC  ETHANOL  COMPREHENSIVE METABOLIC PANEL     EKG     RADIOLOGY     PROCEDURES:  Critical Care performed: No  Procedures   MEDICATIONS ORDERED IN ED: Medications  sodium chloride 0.9 % bolus 1,000 mL (1,000 mLs Intravenous New Bag/Given 09/05/21 1712)  levETIRAcetam (KEPPRA) IVPB 1000 mg/100 mL premix (0 mg Intravenous Stopped 09/05/21 1900)  pantoprazole (PROTONIX) injection 40 mg (40 mg Intravenous Given 09/05/21 1712)     IMPRESSION / MDM / ASSESSMENT AND PLAN / ED COURSE  I reviewed the triage vital signs and the nursing notes.  Differential diagnosis includes, but is not limited to, medication noncompliance, electrolyte abnormality, anemia, alcohol intoxication, dehydration    Patient with a history of epilepsy comes the ED after a seizure in the setting of skipping this morning's dose of Keppra.  I also suspect alcohol intoxication.  Will obtain labs, give IV fluids and IV Keppra. Clinical Course as of 09/05/21 1954  Fri Sep 05, 2021  2952 Patient no longer willing to wait for labs which have been significantly delayed due to issues with blood draws.  She is clinically sober, no additional seizures here.  She has medical decision-making capacity to leave AGAINST MEDICAL ADVICE. [PS]    Clinical Course User Index [PS] Sharman Cheek, MD     FINAL CLINICAL IMPRESSION(S) / ED DIAGNOSES   Final diagnoses:  Seizure (HCC)     Rx / DC Orders   ED Discharge Orders     None        Note:  This document was prepared using Dragon voice recognition software and may include unintentional dictation errors.   Sharman Cheek, MD 09/05/21 [phone]

## 2021-09-05 NOTE — ED Triage Notes (Signed)
Pt to ER via EMS.  Pt was shopping with family when she had a seizure.  Pt has hx of same, last seizure was a year ago.  Per pt niece she has had coffee ground emesis x 3 this AM and has fallen x 2 today.  Pt arrives alert and oriented, but has been post ictal per EMS.

## 2021-09-05 NOTE — ED Notes (Signed)
Discharge instructions provided to patient along with follow-up information. Patient ambulated out to the waiting room without assistance. Patient given beverage and snack prior to discharge.

## 2021-09-10 ENCOUNTER — Telehealth: Payer: Self-pay

## 2021-09-10 NOTE — Telephone Encounter (Signed)
Molly Dennis from [facility] called she is requesting a extension for occupational therapy with a frequency of 1w4w Molly Dennis is requesting a call back once the order has been approved call back:[phone] option 2

## 2021-09-15 NOTE — Telephone Encounter (Signed)
Sure, sounds great. Thank you

## 2021-09-16 ENCOUNTER — Emergency Department: Payer: Medicare Other

## 2021-09-16 ENCOUNTER — Inpatient Hospital Stay
Admission: EM | Admit: 2021-09-16 | Discharge: 2021-09-18 | DRG: 100 | Disposition: A | Discharge status: Home / Self Care | Payer: Medicare Other | Attending: Hospitalist | Admitting: Hospitalist

## 2021-09-16 DIAGNOSIS — R569 Unspecified convulsions: Principal | ICD-10-CM

## 2021-09-16 DIAGNOSIS — L8990 Pressure ulcer of unspecified site, unspecified stage: Secondary | ICD-10-CM | POA: Insufficient documentation

## 2021-09-16 DIAGNOSIS — J690 Pneumonitis due to inhalation of food and vomit: Secondary | ICD-10-CM

## 2021-09-16 DIAGNOSIS — S91309A Unspecified open wound, unspecified foot, initial encounter: Secondary | ICD-10-CM

## 2021-09-16 DIAGNOSIS — R0603 Acute respiratory distress: Secondary | ICD-10-CM

## 2021-09-16 DIAGNOSIS — R0902 Hypoxemia: Secondary | ICD-10-CM

## 2021-09-16 DIAGNOSIS — J9601 Acute respiratory failure with hypoxia: Secondary | ICD-10-CM | POA: Diagnosis present

## 2021-09-16 DIAGNOSIS — F1410 Cocaine abuse, uncomplicated: Secondary | ICD-10-CM | POA: Diagnosis present

## 2021-09-16 DIAGNOSIS — Z20822 Contact with and (suspected) exposure to covid-19: Secondary | ICD-10-CM | POA: Diagnosis present

## 2021-09-16 DIAGNOSIS — G40909 Epilepsy, unspecified, not intractable, without status epilepticus: Secondary | ICD-10-CM | POA: Diagnosis not present

## 2021-09-16 DIAGNOSIS — Z9114 Patient's other noncompliance with medication regimen: Secondary | ICD-10-CM

## 2021-09-16 DIAGNOSIS — T426X6A Underdosing of other antiepileptic and sedative-hypnotic drugs, initial encounter: Secondary | ICD-10-CM | POA: Diagnosis present

## 2021-09-16 DIAGNOSIS — Z91128 Patient's intentional underdosing of medication regimen for other reason: Secondary | ICD-10-CM

## 2021-09-16 DIAGNOSIS — F1010 Alcohol abuse, uncomplicated: Secondary | ICD-10-CM | POA: Diagnosis present

## 2021-09-16 DIAGNOSIS — K219 Gastro-esophageal reflux disease without esophagitis: Secondary | ICD-10-CM | POA: Diagnosis present

## 2021-09-16 DIAGNOSIS — E785 Hyperlipidemia, unspecified: Secondary | ICD-10-CM | POA: Diagnosis present

## 2021-09-16 DIAGNOSIS — G40919 Epilepsy, unspecified, intractable, without status epilepticus: Secondary | ICD-10-CM

## 2021-09-16 DIAGNOSIS — W06XXXA Fall from bed, initial encounter: Secondary | ICD-10-CM | POA: Diagnosis present

## 2021-09-16 DIAGNOSIS — Y92003 Bedroom of unspecified non-institutional (private) residence as the place of occurrence of the external cause: Secondary | ICD-10-CM

## 2021-09-16 LAB — CBC WITH DIFFERENTIAL/PLATELET
Abs Immature Granulocytes: 0.07 10*3/uL (ref 0.00–0.07)
Basophils Absolute: 0.1 10*3/uL (ref 0.0–0.1)
Basophils Relative: 1 %
Eosinophils Absolute: 0.2 10*3/uL (ref 0.0–0.5)
Eosinophils Relative: 3 %
HCT: 37.9 % (ref 36.0–46.0)
Hemoglobin: 12 g/dL (ref 12.0–15.0)
Immature Granulocytes: 1 %
Lymphocytes Relative: 41 %
Lymphs Abs: 3.2 10*3/uL (ref 0.7–4.0)
MCH: 28.2 pg (ref 26.0–34.0)
MCHC: 31.7 g/dL (ref 30.0–36.0)
MCV: 89 fL (ref 80.0–100.0)
Monocytes Absolute: 0.5 10*3/uL (ref 0.1–1.0)
Monocytes Relative: 7 %
Neutro Abs: 3.7 10*3/uL (ref 1.7–7.7)
Neutrophils Relative %: 47 %
Platelets: 392 10*3/uL (ref 150–400)
RBC: 4.26 MIL/uL (ref 3.87–5.11)
RDW: 13.3 % (ref 11.5–15.5)
WBC: 7.7 10*3/uL (ref 4.0–10.5)
nRBC: 0 % (ref 0.0–0.2)

## 2021-09-16 LAB — ETHANOL: Alcohol, Ethyl (B): <10 mg/dL (ref <10)

## 2021-09-16 LAB — LACTIC ACID, PLASMA: Lactic Acid, Venous: 1.7 mmol/L (ref 0.5–1.9)

## 2021-09-16 LAB — BLOOD GAS, ARTERIAL

## 2021-09-16 MED ORDER — LORAZEPAM 2 MG/ML IJ SOLN
1.0000 mg | Freq: Once | INTRAMUSCULAR | Status: AC
  Administered 2021-09-16: 1 mg via INTRAVENOUS

## 2021-09-16 MED ORDER — LEVETIRACETAM IN NACL 1000 MG/100ML IV SOLN
1000.0000 mg | Freq: Once | INTRAVENOUS | Status: AC
  Administered 2021-09-16: 1000 mg via INTRAVENOUS
  Filled 2021-09-16: qty 100

## 2021-09-16 NOTE — Telephone Encounter (Signed)
Spoke to [place]; confirmed orders.
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
## 2021-09-16 NOTE — ED Triage Notes (Signed)
Pt brought from home via ems for seizure activity and breathing difficulty. Pt is responsive to pain upon arrival with oxygen saturations in the mid 80s. Pt placed on 15L non-rebreather at this time.

## 2021-09-17 ENCOUNTER — Emergency Department: Payer: Medicare Other

## 2021-09-17 ENCOUNTER — Ambulatory Visit: Payer: Medicare Other | Admitting: Nurse Practitioner

## 2021-09-17 DIAGNOSIS — R569 Unspecified convulsions: Principal | ICD-10-CM

## 2021-09-17 DIAGNOSIS — T426X6A Underdosing of other antiepileptic and sedative-hypnotic drugs, initial encounter: Secondary | ICD-10-CM | POA: Diagnosis present

## 2021-09-17 DIAGNOSIS — F1010 Alcohol abuse, uncomplicated: Secondary | ICD-10-CM | POA: Diagnosis present

## 2021-09-17 DIAGNOSIS — G40919 Epilepsy, unspecified, intractable, without status epilepticus: Secondary | ICD-10-CM

## 2021-09-17 DIAGNOSIS — W06XXXA Fall from bed, initial encounter: Secondary | ICD-10-CM | POA: Diagnosis present

## 2021-09-17 DIAGNOSIS — Z9114 Patient's other noncompliance with medication regimen: Secondary | ICD-10-CM | POA: Diagnosis not present

## 2021-09-17 DIAGNOSIS — F1410 Cocaine abuse, uncomplicated: Secondary | ICD-10-CM

## 2021-09-17 DIAGNOSIS — Y92003 Bedroom of unspecified non-institutional (private) residence as the place of occurrence of the external cause: Secondary | ICD-10-CM | POA: Diagnosis not present

## 2021-09-17 DIAGNOSIS — R0902 Hypoxemia: Secondary | ICD-10-CM

## 2021-09-17 DIAGNOSIS — J690 Pneumonitis due to inhalation of food and vomit: Secondary | ICD-10-CM

## 2021-09-17 DIAGNOSIS — K219 Gastro-esophageal reflux disease without esophagitis: Secondary | ICD-10-CM | POA: Diagnosis present

## 2021-09-17 DIAGNOSIS — E785 Hyperlipidemia, unspecified: Secondary | ICD-10-CM | POA: Diagnosis present

## 2021-09-17 DIAGNOSIS — J9601 Acute respiratory failure with hypoxia: Secondary | ICD-10-CM

## 2021-09-17 DIAGNOSIS — Z91128 Patient's intentional underdosing of medication regimen for other reason: Secondary | ICD-10-CM | POA: Diagnosis not present

## 2021-09-17 DIAGNOSIS — G40909 Epilepsy, unspecified, not intractable, without status epilepticus: Secondary | ICD-10-CM | POA: Diagnosis present

## 2021-09-17 DIAGNOSIS — Z20822 Contact with and (suspected) exposure to covid-19: Secondary | ICD-10-CM | POA: Diagnosis present

## 2021-09-17 LAB — COMPREHENSIVE METABOLIC PANEL
ALT: 9 U/L (ref 0–44)
AST: 14 U/L — ABNORMAL LOW (ref 15–41)
Albumin: 3.2 g/dL — ABNORMAL LOW (ref 3.5–5.0)
Alkaline Phosphatase: 155 U/L — ABNORMAL HIGH (ref 38–126)
Anion gap: 10 (ref 5–15)
BUN: 10 mg/dL (ref 6–20)
CO2: 26 mmol/L (ref 22–32)
Calcium: 8.5 mg/dL — ABNORMAL LOW (ref 8.9–10.3)
Chloride: 104 mmol/L (ref 98–111)
Creatinine, Ser: 1.04 mg/dL — ABNORMAL HIGH (ref 0.44–1.00)
GFR, Estimated: 36 mL/min — ABNORMAL LOW (ref >60)
Glucose, Bld: 158 mg/dL — ABNORMAL HIGH (ref 70–99)
Potassium: 3.5 mmol/L (ref 3.5–5.1)
Sodium: 140 mmol/L (ref 135–145)
Total Bilirubin: 0.5 mg/dL (ref 0.3–1.2)
Total Protein: 7.4 g/dL (ref 6.5–8.1)

## 2021-09-17 LAB — URINALYSIS, ROUTINE W REFLEX MICROSCOPIC
Bilirubin Urine: NEGATIVE
Glucose, UA: NEGATIVE mg/dL
Hgb urine dipstick: NEGATIVE
Ketones, ur: NEGATIVE mg/dL
Leukocytes,Ua: NEGATIVE
Nitrite: NEGATIVE
Protein, ur: NEGATIVE mg/dL
Specific Gravity, Urine: 1.012 (ref 1.005–1.030)
pH: 6 (ref 5.0–8.0)

## 2021-09-17 LAB — LACTIC ACID, PLASMA
Lactic Acid, Venous: 2 mmol/L (ref 0.5–1.9)
Lactic Acid, Venous: 1.5 mmol/L (ref 0.5–1.9)

## 2021-09-17 LAB — URINE DRUG SCREEN, QUALITATIVE (ARMC ONLY)
Amphetamines, Ur Screen: NOT DETECTED
Barbiturates, Ur Screen: NOT DETECTED
Benzodiazepine, Ur Scrn: NOT DETECTED
Cannabinoid 50 Ng, Ur ~~LOC~~: NOT DETECTED
Cocaine Metabolite,Ur ~~LOC~~: POSITIVE — AB
MDMA (Ecstasy)Ur Screen: NOT DETECTED
Methadone Scn, Ur: NOT DETECTED
Opiate, Ur Screen: NOT DETECTED
Phencyclidine (PCP) Ur S: NOT DETECTED
Tricyclic, Ur Screen: POSITIVE — AB

## 2021-09-17 LAB — CBC
HCT: 37.2 % (ref 36.0–46.0)
Hemoglobin: 11.6 g/dL — ABNORMAL LOW (ref 12.0–15.0)
MCH: 28 pg (ref 26.0–34.0)
MCHC: 31.2 g/dL (ref 30.0–36.0)
MCV: 89.6 fL (ref 80.0–100.0)
Platelets: 347 10*3/uL (ref 150–400)
RBC: 4.15 MIL/uL (ref 3.87–5.11)
RDW: 13.2 % (ref 11.5–15.5)
WBC: 10.3 10*3/uL (ref 4.0–10.5)
nRBC: 0 % (ref 0.0–0.2)

## 2021-09-17 LAB — RESP PANEL BY RT-PCR (FLU A&B, COVID) ARPGX2
Influenza A by PCR: NEGATIVE
Influenza B by PCR: NEGATIVE
SARS Coronavirus 2 by RT PCR: NEGATIVE

## 2021-09-17 LAB — TROPONIN I (HIGH SENSITIVITY)
Troponin I (High Sensitivity): 4 ng/L (ref <18)
Troponin I (High Sensitivity): 4 ng/L (ref <18)

## 2021-09-17 LAB — BASIC METABOLIC PANEL
Anion gap: 9 (ref 5–15)
BUN: 10 mg/dL (ref 6–20)
CO2: 25 mmol/L (ref 22–32)
Calcium: 8.5 mg/dL — ABNORMAL LOW (ref 8.9–10.3)
Chloride: 106 mmol/L (ref 98–111)
Creatinine, Ser: 0.79 mg/dL (ref 0.44–1.00)
GFR, Estimated: >60 mL/min (ref >60)
Glucose, Bld: 92 mg/dL (ref 70–99)
Potassium: 4 mmol/L (ref 3.5–5.1)
Sodium: 140 mmol/L (ref 135–145)

## 2021-09-17 LAB — HIV ANTIBODY (ROUTINE TESTING W REFLEX): HIV Screen 4th Generation wRfx: NONREACTIVE

## 2021-09-17 MED ORDER — LORAZEPAM 2 MG/ML IJ SOLN: 0.0000 mg | Freq: Four times a day (QID) | INTRAMUSCULAR | Status: DC

## 2021-09-17 MED ORDER — SODIUM CHLORIDE 0.9 % IV SOLN
3.0000 g | Freq: Once | INTRAVENOUS | Status: AC
  Administered 2021-09-17: 3 g via INTRAVENOUS
  Filled 2021-09-17: qty 8

## 2021-09-17 MED ORDER — TRAZODONE HCL 50 MG PO TABS
25.0000 mg | ORAL_TABLET | Freq: Every evening | ORAL | Status: DC | PRN
  Administered 2021-09-17: 25 mg via ORAL
  Filled 2021-09-17: qty 1

## 2021-09-17 MED ORDER — THIAMINE HCL 100 MG/ML IJ SOLN
100.0000 mg | Freq: Every day | INTRAMUSCULAR | Status: DC
  Administered 2021-09-17 – 2021-09-18 (×2): 100 mg via INTRAVENOUS
  Filled 2021-09-17 (×2): qty 2

## 2021-09-17 MED ORDER — ACETAMINOPHEN 325 MG PO TABS: 650.0000 mg | ORAL_TABLET | Freq: Four times a day (QID) | ORAL | Status: DC | PRN

## 2021-09-17 MED ORDER — SODIUM CHLORIDE 0.9 % IV SOLN
500.0000 mg | INTRAVENOUS | Status: DC
  Administered 2021-09-17: 500 mg via INTRAVENOUS
  Filled 2021-09-17: qty 5

## 2021-09-17 MED ORDER — ONDANSETRON HCL 4 MG PO TABS: 4.0000 mg | ORAL_TABLET | Freq: Four times a day (QID) | ORAL | Status: DC | PRN

## 2021-09-17 MED ORDER — MAGNESIUM HYDROXIDE 400 MG/5ML PO SUSP: 30.0000 mL | Freq: Every day | ORAL | Status: DC | PRN

## 2021-09-17 MED ORDER — THIAMINE HCL 100 MG PO TABS: 100.0000 mg | ORAL_TABLET | Freq: Every day | ORAL | Status: DC

## 2021-09-17 MED ORDER — ACETAMINOPHEN 650 MG RE SUPP: 650.0000 mg | Freq: Four times a day (QID) | RECTAL | Status: DC | PRN

## 2021-09-17 MED ORDER — LEVETIRACETAM 500 MG PO TABS: 500.0000 mg | ORAL_TABLET | Freq: Two times a day (BID) | ORAL | Status: DC

## 2021-09-17 MED ORDER — LEVETIRACETAM IN NACL 500 MG/100ML IV SOLN
500.0000 mg | Freq: Two times a day (BID) | INTRAVENOUS | Status: DC
  Administered 2021-09-17 – 2021-09-18 (×3): 500 mg via INTRAVENOUS
  Filled 2021-09-17 (×4): qty 100

## 2021-09-17 MED ORDER — SODIUM CHLORIDE 0.9 % IV SOLN: INTRAVENOUS | Status: DC

## 2021-09-17 MED ORDER — ONDANSETRON HCL 4 MG/2ML IJ SOLN: 4.0000 mg | Freq: Four times a day (QID) | INTRAMUSCULAR | Status: DC | PRN

## 2021-09-17 MED ORDER — SODIUM CHLORIDE 0.9 % IV SOLN
3.0000 g | Freq: Four times a day (QID) | INTRAVENOUS | Status: DC
  Administered 2021-09-17: 3 g via INTRAVENOUS
  Filled 2021-09-17: qty 8

## 2021-09-17 MED ORDER — ENOXAPARIN SODIUM 40 MG/0.4ML IJ SOSY
40.0000 mg | PREFILLED_SYRINGE | INTRAMUSCULAR | Status: DC
  Administered 2021-09-17 – 2021-09-18 (×2): 40 mg via SUBCUTANEOUS
  Filled 2021-09-17 (×2): qty 0.4

## 2021-09-17 MED ORDER — ASPIRIN EC 81 MG PO TBEC
81.0000 mg | DELAYED_RELEASE_TABLET | Freq: Every day | ORAL | Status: DC
  Administered 2021-09-18: 81 mg via ORAL
  Filled 2021-09-17: qty 1

## 2021-09-17 NOTE — H&P (Addendum)
[place]   PATIENT NAME: Molly Dennis    MR#:  [id number]  DATE OF BIRTH:  Dec 23, 1970  DATE OF ADMISSION:  09/16/2021  PRIMARY CARE PHYSICIAN: System, Provider Not In   Patient is coming from: Home  REQUESTING/REFERRING PHYSICIAN: Lurline Hare, MD  CHIEF COMPLAINT:   Chief Complaint  Patient presents with   Respiratory Distress   Seizures    HISTORY OF PRESENT ILLNESS:  Molly Dennis is a 51 y.o. female with medical history significant for alcohol use as well as polysubstance abuse and seizure disorder, who presented to the emergency room with acute onset of seizure with subsequent fall from her bed after which she called her family member.  EMS reported that she was unresponsive upon arrival and more awake on route to the ED.  She was still postictal upon arrival to the ED and was fairly combative.  Pulse oximetry was dropped into the mid 80s for which she was placed on nonrebreather.  She was noted to have audible crackles in her lungs with gurgling.  During the interview she was very somnolent and difficult to arouse and therefore no further history could be obtained.  ED Course: When she came to the ER, heart rate was 111 with BP 157/83 and respiratory rate of 18 and later 21.Marland Kitchen  She was 82% on room air.  Labs revealed borderline potassium of 3.5 and creatinine 1.04 with albumin of 3.2 alk phos of 155.  As was 1.7 L to CBC was within normal.  High-sensitivity troponin I was 4.  UA was negative.  Urine drug screen was positive for cocaine and tricyclic's.  Blood cultures were drawn.  Urine culture was drawn.  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  EKG as reviewed by me : EKG showed sinus tachycardia with rate 110 with left axis deviation Imaging: Portable chest ray showed no acute cardiopulmonary disease. Noncontrasted CT scan showed old right MCA territory infarction and  encephalomalacia with no acute intracranial normality. PAST MEDICAL HISTORY:   Seizure disorder, polysubstance abuse including EtOH  PAST SURGICAL HISTORY:   History is unobtainable due to altered mental status.  SOCIAL HISTORY:   Social History   Tobacco Use   Smoking status: Not on file   Smokeless tobacco: Not on file  Substance Use Topics   Alcohol use: Not on file  Positive for alcohol abuse and cocaine  FAMILY HISTORY:  No family history on file.  Unobtainable due to altered mental status  DRUG ALLERGIES:  No Known Allergies  REVIEW OF SYSTEMS:   ROS as per HPI otherwise unobtainable due to altered mental status and somnolenc   MEDICATIONS AT HOME:   Prior to Admission medications   Not on File      VITAL SIGNS:  Blood pressure (!) 123/94, pulse 92, temperature 98.6 F (37 C), temperature source Axillary, resp. rate 17, height '5\' 3"'  (1.6 m), weight 75 kg, SpO2 98 %.  PHYSICAL EXAMINATION:  Physical Exam  GENERAL: Acutely ill 51 y.o.-year-old Caucasian female patient lying in the bed on BiPAP with mild respiratory distress. EYES: Pupils equal, round, reactive to light and accommodation. No scleral icterus. Extraocular muscles intact.  HEENT: Head atraumatic, normocephalic. Oropharynx and nasopharynx clear.  NECK:  Supple, no jugular venous distention. No thyroid enlargement, no tenderness.  LUNGS: Diminished bibasilar breath sounds with bibasal crackles.  CARDIOVASCULAR: Regular rate and rhythm, S1, S2 normal. No murmurs, rubs, or  gallops.  ABDOMEN: Soft, nondistended, nontender. Bowel sounds present. No organomegaly or mass.  EXTREMITIES: No pedal edema, cyanosis, or clubbing.  NEUROLOGIC: Cranial nerves II through XII are intact. Muscle strength 5/5 in all extremities. Sensation intact. Gait not checked.  PSYCHIATRIC: The patient is somnolent and difficult to arouse.   SKIN: No obvious rash, lesion, or ulcer.   LABORATORY PANEL:   CBC Recent Labs  Lab  09/16/21 2317  WBC 7.7  HGB 12.0  HCT 37.9  PLT 392   ------------------------------------------------------------------------------------------------------------------  Chemistries  Recent Labs  Lab 09/16/21 2317  NA 140  K 3.5  CL 104  CO2 26  GLUCOSE 158*  BUN 10  CREATININE 1.04*  CALCIUM 8.5*  AST 14*  ALT 9  ALKPHOS 155*  BILITOT 0.5   ------------------------------------------------------------------------------------------------------------------  Cardiac Enzymes No results for input(s): TROPONINI in the last 168 hours. ------------------------------------------------------------------------------------------------------------------  RADIOLOGY:  CT Head Wo Contrast  Result Date: 09/17/2021 CLINICAL DATA:  Altered mental status. EXAM: CT HEAD WITHOUT CONTRAST TECHNIQUE: Contiguous axial images were obtained from the base of the skull through the vertex without intravenous contrast. RADIATION DOSE REDUCTION: This exam was performed according to the departmental dose-optimization program which includes automated exposure control, adjustment of the mA and/or kV according to patient size and/or use of iterative reconstruction technique. COMPARISON:  Eighty CT dated 11/27/2019. FINDINGS: Brain: Mild age-related atrophy and chronic microvascular ischemic changes. 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 There is mild ex vacuo dilatation of the right lateral ventricle. There is no acute intracranial hemorrhage. No mass effect or midline shift. No extra-axial fluid collection. Vascular: No hyperdense vessel or unexpected calcification. Skull: Normal. Negative for fracture or focal lesion. Sinuses/Orbits: No acute finding. Other: None IMPRESSION: 1. No acute intracranial pathology. 2. 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 Electronically Signed   By: Anner Crete M.D.   On: 09/17/2021 00:30   DG Chest Port 1 View  Result Date: 09/16/2021 CLINICAL  DATA:  seizure EXAM: PORTABLE CHEST 1 VIEW.  Patient rotation. COMPARISON:  None. FINDINGS: The heart and mediastinal contours are within normal limits. No definite focal consolidation. No pulmonary edema. No pleural effusion. No pneumothorax. No acute osseous abnormality. Plate and screw fixation of the proximal left humerus. IMPRESSION: No active disease. Slightly limited evaluation due to patient rotation. Electronically Signed   By: Iven Finn M.D.   On: 09/16/2021 23:51      IMPRESSION AND PLAN:  Principal Problem:   Seizure ([place])  1.  Breakthrough seizures with history of seizure disorder. - The patient be admitted to a PCU bed. - We will follow neurochecks every 4 hours for 24 hours. - Seizures precautions. - She will be placed on as needed IV Ativan. - We will continue IV loading dose IV Keppra with p.o. Keppra maintenance dose. - Will obtain EEG - Neurology consult to be obtained. - I notified Dr. Theda Sers about the patient.  2.  Acute hypoxic respiratory failure likely secondary to aspiration pneumonia/pneumonitis. - The patient will be resumed on BiPAP. - Suction will be provided as needed. - We will place on IV Unasyn and Zithromax. - Mucolytic therapy will be provided. - Bronchodilator therapy will be provided as needed. - We will follow blood cultures.  3.  GERD. - We will continue PPI therapy.  4.  BPH. - We will continue Flomax.  5.  Dyslipidemia. - We will continue statin therapy.  DVT prophylaxis: Lovenox. Advanced Care Planning:  Code Status: full code. Family Communication:  The plan of care was discussed in details with the patient (and family). I answered all questions. The patient agreed to proceed with the above mentioned plan. Further management will depend upon hospital course. Disposition Plan: Back to previous home environment Consults called: none neurology All the records are reviewed and case discussed with ED provider.  Status is:  Inpatient   At the time of the admission, it appears that the appropriate admission status for this patient is inpatient.  This is judged to be reasonable and necessary in order to provide the required intensity of service to ensure the patient's safety given the presenting symptoms, physical exam findings and initial radiographic and laboratory data in the context of comorbid conditions.  The patient requires inpatient status due to high intensity of service, high risk of further deterioration and high frequency of surveillance required.  I certify that at the time of admission, it is my clinical judgment that the patient will require inpatient hospital care extending more than 2 midnights.                            Dispo: The patient is from: Home              Anticipated d/c is to: Home              Patient currently is not medically stable to d/c.              Difficult to place patient: No Authorized and performed by: Eugenie Norrie, MD Total critical care time: Approximately  55   minutes. Due to a high probability of clinically significant, life-threatening deterioration, the patient required my highest level of preparedness to intervene emergently and I personally spent this critical care time directly and personally managing the patient.  This critical care time included obtaining a history, examining the patient, pulse oximetry, ordering and review of studies, arranging urgent treatment with development of management plan, evaluation of patient's response to treatment, frequent reassessment, and discussions with other providers. This critical care time was performed to assess and manage the high probability of imminent, life-threatening deterioration that could result in multiorgan failure.  It was exclusive of separately billable procedures and treating other patients and teaching time.   Christel Mormon M.D on 09/17/2021 at 3:17 AM  [facility]   From 7 PM-7 AM, contact  night-coverage [email or web address]  CC: Primary care physician; System, Provider Not In

## 2021-09-17 NOTE — ED Notes (Signed)
Admission MD messaged in regard to pt failing swallow screen with night shift RN. Will hold PO medication at this time

## 2021-09-17 NOTE — Progress Notes (Signed)
PHARMACY -  BRIEF ANTIBIOTIC NOTE   Pharmacy has received consult(s) for Unasyn from an ED provider.  The patient's profile has been reviewed for ht/wt/allergies/indication/available labs.    One time order(s) placed for Unasyn 3 gm IV X 1  Further antibiotics/pharmacy consults should be ordered by admitting physician if indicated.                       Thank you, Eddye Broxterman D 09/17/2021  12:39 AM

## 2021-09-17 NOTE — ED Notes (Signed)
Pt took off purewick and not letting this RN place new one at this time

## 2021-09-17 NOTE — ED Notes (Signed)
Lab at bedside to attempt blood draw.

## 2021-09-17 NOTE — ED Notes (Signed)
RT called to transport pt to assigned room

## 2021-09-17 NOTE — ED Notes (Signed)
Pt provided warm blankets at this time. Unable to obtain labs at this time.

## 2021-09-17 NOTE — ED Notes (Signed)
Right heel wrapped with xeroform and gauze.

## 2021-09-17 NOTE — Consult Note (Signed)
Neurology Consult H&P  Molly Dennis MR# [id number] 09/17/2021   CC: breakthrough seizure  History is obtained from: patient and chart.  HPI: Molly Dennis is a 51 y.o. female PMHx as reviewed below EtOH and polysubstance abuse, seizure disorder brought in by EMS after having seizure with subsequent fall from her bed. EMS found her unresponsive upon arrival she gradually became more awake en route to the ED.    Chart review shows she was still postictal and fairly combative upon arrival.   ROS: A complete ROS was performed and is negative except as noted in the HPI.   Social History:  has no history on file for tobacco use, alcohol use, and drug use.   Prior to Admission medications   Medication Sig Start Date End Date Taking? Authorizing Provider  albuterol (VENTOLIN HFA) 108 (90 Base) MCG/ACT inhaler Inhale 1-2 puffs into the lungs every 4 (four) hours as needed. 08/08/21  Yes [provider]  amitriptyline (ELAVIL) 50 MG tablet Take 50 mg by mouth at bedtime. 08/29/21  Yes [provider]  ARIPiprazole (ABILIFY) 10 MG tablet Take 10 mg by mouth daily. 08/29/21  Yes [provider]  atomoxetine (STRATTERA) 60 MG capsule Take 60 mg by mouth every morning. 08/29/21  Yes [provider]  atorvastatin (LIPITOR) 40 MG tablet Take 40 mg by mouth daily. 08/08/21  Yes [provider]  diclofenac Sodium (VOLTAREN) 1 % GEL Apply topically. 08/08/21  Yes [provider]  DULoxetine (CYMBALTA) 60 MG capsule Take 60 mg by mouth daily. 08/29/21  Yes [provider]  gabapentin (NEURONTIN) 300 MG capsule Take 300 mg by mouth 3 (three) times daily. 08/08/21  Yes [provider]  levETIRAcetam (KEPPRA) 500 MG tablet Take 500 mg by mouth daily. 08/08/21  Yes [provider]  QUEtiapine (SEROQUEL) 400 MG tablet Take 400 mg by mouth 2 (two) times daily. 08/29/21  Yes [provider]  tamsulosin (FLOMAX) 0.4 MG CAPS capsule  Take 0.4 mg by mouth daily. 08/08/21  Yes [provider]  Grant Ruts INHUB 100-50 MCG/ACT AEPB Inhale 1 puff into the lungs 2 (two) times daily. 08/08/21  Yes [provider]  loperamide (IMODIUM) 2 MG capsule Take 2 mg by mouth as needed. 08/08/21   [provider]   Exam: Current vital signs: BP 118/75    Pulse 87    Temp (!) 96.8 F (36 C) (Axillary)    Resp 15    Ht 5\' 3"  (1.6 m)    Wt 75 kg    SpO2 100%    BMI 29.29 kg/m   Physical Exam  Constitutional: Appears well-developed and well-nourished.  Psych: Affect appropriate to situation Eyes: No scleral injection HENT: No OP obstruction. Head: Normocephalic.  Cardiovascular: Normal rate and regular rhythm.  Respiratory: Effort normal, symmetric excursions bilaterally, no audible wheezing. GI: Soft.  No distension. There is no tenderness.  Skin: WDI  Neuro: Mental Status: Patient is awake, alert, oriented to person, place, month, year, and situation. Resting in bed with CPAP Patient is able to give a clear and coherent history. Speech fluent, intact comprehension and repetition. No signs of aphasia or neglect. Visual Fields are full. Pupils are equal, round, and reactive to light. EOMI without ptosis or diplopia.  Facial sensation is symmetric to temperature Facial movement is symmetric.  Hearing is intact to voice. Uvula midline and palate elevates symmetrically. Shoulder shrug is symmetric. Tongue is midline without atrophy or fasciculations.  Tone is normal. Bulk is  normal. 5/5 strength was present in all four extremities. Sensation is symmetric to light touch and temperature in the arms and legs. Deep Tendon Reflexes: 2+ and symmetric in the biceps and patellae. Toes are downgoing bilaterally. FNF and HKS are intact bilaterally. Gait - Deferred  I have reviewed labs in epic and the pertinent results are: Cocaine (+) Tricyclic (+)  I have reviewed the images obtained: NCT head showed Old right  MCA territory infarct and encephalomalacia. There is mild ex vacuo dilatation of the right lateral ventricle. There is no acute intracranial hemorrhage. No mass effect or midline shift.  Assessment: Molly Dennis is a 51 y.o. female PMHx seizure on LEV with breakthrough seizure this morning. She is now back to baseline. It is not clear if she has seizure disorder as there are no records and she did test positive for cocaine which may provoke seizure.  She stated that she frequently misses doses of levetiracetam a couple of times per week and had seizure last week.   She may benefit from switching to levetiracetam XR at a slightly higher dose.  Impression:  Breakthrough seizure Polysubstance abuse EtOH abuse Seizure disorder Medication nonadherence  Plan: -  Increase and switch levetiracetam to 750mg  XR daily.  - Follow up with outpatient neurology. - Neurology will remain available, please call for questions.  Electronically signed by:  Lynnae Sandhoff, MD Page: [phone] 09/17/2021, 10:39 AM  If 7pm- 7am, please page neurology on call as listed in [facility].

## 2021-09-18 ENCOUNTER — Ambulatory Visit: Payer: Commercial Managed Care - HMO | Admitting: Adult Health

## 2021-09-18 DIAGNOSIS — L8990 Pressure ulcer of unspecified site, unspecified stage: Secondary | ICD-10-CM | POA: Insufficient documentation

## 2021-09-18 LAB — CBC
HCT: 31.1 % — ABNORMAL LOW (ref 36.0–46.0)
Hemoglobin: 9.9 g/dL — ABNORMAL LOW (ref 12.0–15.0)
MCH: 28 pg (ref 26.0–34.0)
MCHC: 31.8 g/dL (ref 30.0–36.0)
MCV: 88.1 fL (ref 80.0–100.0)
Platelets: 360 10*3/uL (ref 150–400)
RBC: 3.53 MIL/uL — ABNORMAL LOW (ref 3.87–5.11)
RDW: 13.1 % (ref 11.5–15.5)
WBC: 10.2 10*3/uL (ref 4.0–10.5)
nRBC: 0 % (ref 0.0–0.2)

## 2021-09-18 LAB — BASIC METABOLIC PANEL
Anion gap: 7 (ref 5–15)
BUN: 9 mg/dL (ref 6–20)
CO2: 27 mmol/L (ref 22–32)
Calcium: 8.5 mg/dL — ABNORMAL LOW (ref 8.9–10.3)
Chloride: 103 mmol/L (ref 98–111)
Creatinine, Ser: 0.81 mg/dL (ref 0.44–1.00)
GFR, Estimated: >60 mL/min (ref >60)
Glucose, Bld: 84 mg/dL (ref 70–99)
Potassium: 3.7 mmol/L (ref 3.5–5.1)
Sodium: 137 mmol/L (ref 135–145)

## 2021-09-18 LAB — URINE CULTURE: Culture: NO GROWTH

## 2021-09-18 LAB — MAGNESIUM: Magnesium: 1.6 mg/dL — ABNORMAL LOW (ref 1.7–2.4)

## 2021-09-18 MED ORDER — LEVETIRACETAM 500 MG PO TABS: 500.0000 mg | ORAL_TABLET | Freq: Two times a day (BID) | ORAL | 0 refills | Status: DC

## 2021-09-18 MED ORDER — MAGNESIUM SULFATE 2 GM/50ML IV SOLN
2.0000 g | Freq: Once | INTRAVENOUS | Status: AC
  Administered 2021-09-18: 2 g via INTRAVENOUS
  Filled 2021-09-18: qty 50

## 2021-09-18 NOTE — Discharge Summary (Signed)
Physician Discharge Summary   Molly Dennis  female DOB: May 16, 1971  [id number]  PCP: System, Provider Not In  Admit date: 09/16/2021 Discharge date: 09/18/2021  Admitted From: home Disposition:  home CODE STATUS: Full code  Discharge Instructions     Discharge instructions   Complete by: As directed    Please be sure to take your Keppra, which is 500 mg twice daily.  You are on many sedating medications.  Since you want to work on reducing them with your PCP, I will not make any changes.    Please stop using cocaine as that can lower your seizure threshold.   [facility] Course:  For full details, please see H&P, progress notes, consult notes and ancillary notes.  Briefly,  Molly Dennis is a 51 y.o. female with medical history significant for alcohol use as well as polysubstance abuse and seizure disorder, who presented to the emergency room with reported acute onset of seizure with subsequent fall from her bed after which she called her family member.  EMS reported that she was unresponsive upon arrival and more awake on route to the ED.  She was still postictal upon arrival to the ED and was fairly combative.  Pulse oximetry was dropped into the mid 80s for which she was placed on nonrebreather.  She was noted to have audible crackles in her lungs with gurgling.   Breakthrough seizures 2/2 medication non-compliance --pt admitted to not taking her Keppra every day, also her home Keppra dose was documented as 500 mg daily. --Pt was loaded with IV Keppra.   --Neuro consulted and rec switching to switch levetiracetam to 750mg  XR daily, however, per pharm, the XR formulation is much more expensive, which may worsen the compliance, so pt was discharged on home Keppra 500 mg but at BID instead of daily.  Acute hypoxic respiratory failure likely secondary to aspiration, resolved  --pt was started on IV Unasyn and Zithromax on admission, but abx not continued  since pt was back on RA the next day, and CXR was clear.  Pt likely had some pneumonitis that cleared on its own.  GERD. - continue PPI therapy.  Dyslipidemia. - continue statin therapy.  Cocaine abuse --UDS pos for cocaine.  Pt was advised that cocaine can lower seizure threshold.   Discharge Diagnoses:  Principal Problem:   Seizure [facility]) Active Problems:   Pressure injury of skin   30 Day Unplanned Readmission Risk Score    Flowsheet Row ED to Hosp-Admission (Current) from 09/16/2021 in [facility]  30 Day Unplanned Readmission Risk Score (%) 13.51 Filed at 09/18/2021 0801       This score is the patient's risk of an unplanned readmission within 30 days of being discharged (0 -100%). The score is based on dignosis, age, lab data, medications, orders, and past utilization.   Low:  0-14.9   Medium: 15-21.9   High: 22-29.9   Extreme: 30 and above         Discharge Instructions:  Allergies as of 09/18/2021   No Known Allergies      Medication List     TAKE these medications    albuterol 108 (90 Base) MCG/ACT inhaler Commonly known as: VENTOLIN HFA Inhale 1-2 puffs into the lungs every 4 (four) hours as needed.   amitriptyline 50 MG tablet Commonly known as: ELAVIL Take 50 mg by mouth at bedtime.   ARIPiprazole 10 MG tablet Commonly known  as: ABILIFY Take 10 mg by mouth daily.   atomoxetine 60 MG capsule Commonly known as: STRATTERA Take 60 mg by mouth every morning.   atorvastatin 40 MG tablet Commonly known as: LIPITOR Take 40 mg by mouth daily.   diclofenac Sodium 1 % Gel Commonly known as: VOLTAREN Apply topically.   DULoxetine 60 MG capsule Commonly known as: CYMBALTA Take 60 mg by mouth daily.   gabapentin 300 MG capsule Commonly known as: NEURONTIN Take 300 mg by mouth 3 (three) times daily.   levETIRAcetam 500 MG tablet Commonly known as: KEPPRA Take 1 tablet (500 mg total) by mouth 2 (two) times daily. What  changed: when to take this   loperamide 2 MG capsule Commonly known as: IMODIUM Take 2 mg by mouth as needed.   QUEtiapine 400 MG tablet Commonly known as: SEROQUEL Take 400 mg by mouth 2 (two) times daily.   tamsulosin 0.4 MG Caps capsule Commonly known as: FLOMAX Take 0.4 mg by mouth daily.   Wixela Inhub 100-50 MCG/ACT Aepb Generic drug: fluticasone-salmeterol Inhale 1 puff into the lungs 2 (two) times daily.         Follow-up Information     Your PCP Follow up in 1 week(s).                  No Known Allergies   The results of significant diagnostics from this hospitalization (including imaging, microbiology, ancillary and laboratory) are listed below for reference.   Consultations:   Procedures/Studies: CT Head Wo Contrast  Result Date: 09/17/2021 CLINICAL DATA:  Altered mental status. EXAM: CT HEAD WITHOUT CONTRAST TECHNIQUE: Contiguous axial images were obtained from the base of the skull through the vertex without intravenous contrast. RADIATION DOSE REDUCTION: This exam was performed according to the departmental dose-optimization program which includes automated exposure control, adjustment of the mA and/or kV according to patient size and/or use of iterative reconstruction technique. COMPARISON:  Eighty CT dated 11/27/2019. FINDINGS: Brain: Mild age-related atrophy and chronic microvascular ischemic changes. 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 There is mild ex vacuo dilatation of the right lateral ventricle. There is no acute intracranial hemorrhage. No mass effect or midline shift. No extra-axial fluid collection. Vascular: No hyperdense vessel or unexpected calcification. Skull: Normal. Negative for fracture or focal lesion. Sinuses/Orbits: No acute finding. Other: None IMPRESSION: 1. No acute intracranial pathology. 2. 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 Electronically Signed   By: Anner Crete M.D.   On: 09/17/2021  00:30   DG Chest Port 1 View  Result Date: 09/16/2021 CLINICAL DATA:  seizure EXAM: PORTABLE CHEST 1 VIEW.  Patient rotation. COMPARISON:  None. FINDINGS: The heart and mediastinal contours are within normal limits. No definite focal consolidation. No pulmonary edema. No pleural effusion. No pneumothorax. No acute osseous abnormality. Plate and screw fixation of the proximal left humerus. IMPRESSION: No active disease. Slightly limited evaluation due to patient rotation. Electronically Signed   By: Iven Finn M.D.   On: 09/16/2021 23:51      Labs: BNP (last 3 results) No results for input(s): BNP in the last 8760 hours. Basic Metabolic Panel: Recent Labs  Lab 09/16/21 2317 09/17/21 0714 09/18/21 0430  NA 140 140 137  K 3.5 4.0 3.7  CL 104 106 103  CO2 26 25 27   GLUCOSE 158* 92 84  BUN 10 10 9   CREATININE 1.04* 0.79 0.81  CALCIUM 8.5* 8.5* 8.5*  MG  --   --  1.6*  Liver Function Tests: Recent Labs  Lab 09/16/21 2317  AST 14*  ALT 9  ALKPHOS 155*  BILITOT 0.5  PROT 7.4  ALBUMIN 3.2*   No results for input(s): LIPASE, AMYLASE in the last 168 hours. No results for input(s): AMMONIA in the last 168 hours. CBC: Recent Labs  Lab 09/16/21 2317 09/17/21 0714 09/18/21 0430  WBC 7.7 10.3 10.2  NEUTROABS 3.7  --   --   HGB 12.0 11.6* 9.9*  HCT 37.9 37.2 31.1*  MCV 89.0 89.6 88.1  PLT 392 347 360   Cardiac Enzymes: No results for input(s): CKTOTAL, CKMB, CKMBINDEX, TROPONINI in the last 168 hours. BNP: Invalid input(s): POCBNP CBG: No results for input(s): GLUCAP in the last 168 hours. D-Dimer No results for input(s): DDIMER in the last 72 hours. Hgb A1c No results for input(s): HGBA1C in the last 72 hours. Lipid Profile No results for input(s): CHOL, HDL, LDLCALC, TRIG, CHOLHDL, LDLDIRECT in the last 72 hours. Thyroid function studies No results for input(s): TSH, T4TOTAL, T3FREE, THYROIDAB in the last 72 hours.  Invalid input(s): FREET3 Anemia work  up No results for input(s): VITAMINB12, FOLATE, FERRITIN, TIBC, IRON, RETICCTPCT in the last 72 hours. Urinalysis    Component Value Date/Time   COLORURINE YELLOW (A) 09/16/2021 2317   APPEARANCEUR CLEAR (A) 09/16/2021 2317   LABSPEC 1.012 09/16/2021 2317   PHURINE 6.0 09/16/2021 2317   GLUCOSEU NEGATIVE 09/16/2021 2317   HGBUR NEGATIVE 09/16/2021 2317   BILIRUBINUR NEGATIVE 09/16/2021 2317   KETONESUR NEGATIVE 09/16/2021 2317   PROTEINUR NEGATIVE 09/16/2021 2317   NITRITE NEGATIVE 09/16/2021 2317   LEUKOCYTESUR NEGATIVE 09/16/2021 2317   Sepsis Labs Invalid input(s): PROCALCITONIN,  WBC,  LACTICIDVEN Microbiology Recent Results (from the past 240 hour(s))  Resp Panel by RT-PCR (Flu A&B, Covid) Nasopharyngeal Swab     Status: None   Collection Time: 09/16/21 11:17 PM   Specimen: Nasopharyngeal Swab; Nasopharyngeal(NP) swabs in vial transport medium  Result Value Ref Range Status   SARS Coronavirus 2 by RT PCR NEGATIVE NEGATIVE Final    Comment: (NOTE) SARS-CoV-2 target nucleic acids are NOT DETECTED.  The SARS-CoV-2 RNA is generally detectable in upper respiratory specimens during the acute phase of infection. The lowest concentration of SARS-CoV-2 viral copies this assay can detect is 138 copies/mL. A negative result does not preclude SARS-Cov-2 infection and should not be used as the sole basis for treatment or other patient management decisions. A negative result may occur with  improper specimen collection/handling, submission of specimen other than nasopharyngeal swab, presence of viral mutation(s) within the areas targeted by this assay, and inadequate number of viral copies(<138 copies/mL). A negative result must be combined with clinical observations, patient history, and epidemiological information. The expected result is Negative.  Fact Sheet for Patients:  EntrepreneurPulse.com.au  Fact Sheet for Healthcare Providers:   IncredibleEmployment.be  This test is no t yet approved or cleared by the [place] FDA and  has been authorized for detection and/or diagnosis of SARS-CoV-2 by FDA under an Emergency Use Authorization (EUA). This EUA will remain  in effect (meaning this test can be used) for the duration of the COVID-19 declaration under Section 564(b)(1) of the Act, 21 U.S.C.section 360bbb-3(b)(1), unless the authorization is terminated  or revoked sooner.       Influenza A by PCR NEGATIVE NEGATIVE Final   Influenza B by PCR NEGATIVE NEGATIVE Final    Comment: (NOTE) The Xpert Xpress SARS-CoV-2/FLU/RSV plus assay is intended as an aid in the  diagnosis of influenza from Nasopharyngeal swab specimens and should not be used as a sole basis for treatment. Nasal washings and aspirates are unacceptable for Xpert Xpress SARS-CoV-2/FLU/RSV testing.  Fact Sheet for Patients: EntrepreneurPulse.com.au  Fact Sheet for Healthcare Providers: IncredibleEmployment.be  This test is not yet approved or cleared by the [place] FDA and has been authorized for detection and/or diagnosis of SARS-CoV-2 by FDA under an Emergency Use Authorization (EUA). This EUA will remain in effect (meaning this test can be used) for the duration of the COVID-19 declaration under Section 564(b)(1) of the Act, 21 U.S.C. section 360bbb-3(b)(1), unless the authorization is terminated or revoked.  Performed at [facility], [address], [place], [place] [address]   Urine Culture     Status: None   Collection Time: 09/16/21 11:17 PM   Specimen: Urine, Catheterized  Result Value Ref Range Status   Specimen Description   Final    URINE, CATHETERIZED Performed at [facility], [address], [place], [place] [address]    Special Requests   Final    NONE Performed at [facility], [address] [place], [place] [address]     Culture   Final    NO GROWTH Performed at [facility], [place] [address], [place], [place] [address]    Report Status 09/18/2021 FINAL  Final  Culture, blood (routine x 2)     Status: None (Preliminary result)   Collection Time: 09/17/21 12:31 AM   Specimen: BLOOD  Result Value Ref Range Status   Specimen Description BLOOD RIGHT ARM  Final   Special Requests IN PEDIATRIC BOTTLE Blood Culture adequate volume  Final   Culture   Final    NO GROWTH 1 DAY Performed at [facility], [address], [place], [place] [address]    Report Status PENDING  Incomplete  Culture, blood (routine x 2)     Status: None (Preliminary result)   Collection Time: 09/17/21 12:31 AM   Specimen: BLOOD  Result Value Ref Range Status   Specimen Description BLOOD LEFT WRIST  Final   Special Requests IN PEDIATRIC BOTTLE Blood Culture adequate volume  Final   Culture   Final    NO GROWTH 1 DAY Performed at [facility], [address], [place], [place] [address]    Report Status PENDING  Incomplete     Total time spend on discharging this patient, including the last patient exam, discussing the hospital stay, instructions for ongoing care as it relates to all pertinent caregivers, as well as preparing the medical discharge records, prescriptions, and/or referrals as applicable, is 35 minutes.    Enzo Bi, MD  [facility] 09/18/2021, 9:24 AM

## 2021-09-19 LAB — BLOOD GAS, ARTERIAL
Acid-Base Excess: 5.8 mmol/L — ABNORMAL HIGH (ref 0.0–2.0)
Bicarbonate: 33.1 mmol/L — ABNORMAL HIGH (ref 20.0–28.0)
FIO2: 1 %
O2 Saturation: 97.1 %
Patient temperature: 37
pCO2 arterial: 60 mmHg — ABNORMAL HIGH (ref 32–48)
pH, Arterial: 7.35 (ref 7.35–7.45)
pO2, Arterial: 82 mmHg — ABNORMAL LOW (ref 83–108)

## 2021-09-22 LAB — CULTURE, BLOOD (ROUTINE X 2)
Culture: NO GROWTH
Culture: NO GROWTH
Special Requests: ADEQUATE
Special Requests: ADEQUATE
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
## 2021-09-23 ENCOUNTER — Ambulatory Visit: Payer: Medicare Other | Admitting: Nurse Practitioner

## 2021-09-24 ENCOUNTER — Encounter: Payer: Self-pay | Admitting: Physician Assistant

## 2021-09-24 ENCOUNTER — Ambulatory Visit (INDEPENDENT_AMBULATORY_CARE_PROVIDER_SITE_OTHER): Payer: Medicare Other | Admitting: Physician Assistant

## 2021-09-24 ENCOUNTER — Other Ambulatory Visit: Payer: Self-pay

## 2021-09-24 DIAGNOSIS — Z9889 Other specified postprocedural states: Secondary | ICD-10-CM

## 2021-09-30 ENCOUNTER — Telehealth: Payer: Self-pay | Admitting: *Deleted

## 2021-09-30 NOTE — Telephone Encounter (Signed)
Received on (09/16/21) via of fax Physician Order from [facility] requesting signature and date and return.  Given to provider to sign.  Physician Order signed and faxed back to [facility].  Confirmation received and copy scanned into the chart.//AB/CMA

## 2021-10-02 ENCOUNTER — Telehealth: Payer: Self-pay | Admitting: *Deleted

## 2021-10-02 NOTE — Telephone Encounter (Signed)
Received on (09/04/2021) via of fax Client Coordination Note Report from [facility].  Requesting signature,date,and return.  Given to provider to sign.   ? ?Client Coordination Note Report signed and faxed back to [facility].  Confirmation received and copy scanned into the chart.//AB/CMA ?

## 2021-10-02 NOTE — Telephone Encounter (Signed)
Received on (09/05/2021) via of fax [place] from [facility] requesting signature and return. Given to provider to sign and return. ? ?Home Health Certification and Plan of Care signed and faxed back to [facility].  Confirmation received and copy scanned into the chart.//AB/CMA   ?

## 2021-10-06 ENCOUNTER — Telehealth: Payer: Self-pay | Admitting: *Deleted

## 2021-10-06 NOTE — Telephone Encounter (Signed)
Received on (09/10/2021) via of fax Standard Written Order from [employer]  Requesting signature and date and return.  Given to provider to sign. ? ?Standard Written Order signed and faxed back to [employer]  Confirmation received and copy scanned into the chart.//AB/CMA ?

## 2021-10-07 ENCOUNTER — Telehealth: Payer: Self-pay

## 2021-10-07 NOTE — Telephone Encounter (Signed)
Home Health verbal orders ? ?Agency Name: [facility] ? ?Requesting OT/PT/Skilled nursing/Social Work/Speech: nursing for wound care ? ?Frequency: twice a week for 9 weeks.  ? ?Please forward to [facility] pool or providers CMA  ?

## 2021-10-07 NOTE — Telephone Encounter (Signed)
Called [place], na, left detailed vm on secured vm confirming verbal orders; approved by [place], [place].  ?

## 2021-10-21 ENCOUNTER — Ambulatory Visit (INDEPENDENT_AMBULATORY_CARE_PROVIDER_SITE_OTHER): Payer: Medicare Other | Admitting: Nurse Practitioner

## 2021-10-21 ENCOUNTER — Encounter: Payer: Self-pay | Admitting: Nurse Practitioner

## 2021-10-21 ENCOUNTER — Telehealth: Payer: Self-pay

## 2021-10-21 ENCOUNTER — Other Ambulatory Visit: Payer: Self-pay

## 2021-10-21 VITALS — BP 90/62 | HR 95 | Temp 96.6°F | Resp 12 | Ht 63.0 in | Wt 170.1 lb

## 2021-10-21 DIAGNOSIS — K219 Gastro-esophageal reflux disease without esophagitis: Secondary | ICD-10-CM

## 2021-10-21 DIAGNOSIS — F1420 Cocaine dependence, uncomplicated: Secondary | ICD-10-CM

## 2021-10-21 DIAGNOSIS — R569 Unspecified convulsions: Secondary | ICD-10-CM | POA: Diagnosis not present

## 2021-10-21 DIAGNOSIS — G629 Polyneuropathy, unspecified: Secondary | ICD-10-CM | POA: Diagnosis not present

## 2021-10-21 DIAGNOSIS — Z8673 Personal history of transient ischemic attack (TIA), and cerebral infarction without residual deficits: Secondary | ICD-10-CM

## 2021-10-21 DIAGNOSIS — Z122 Encounter for screening for malignant neoplasm of respiratory organs: Secondary | ICD-10-CM

## 2021-10-21 DIAGNOSIS — R911 Solitary pulmonary nodule: Secondary | ICD-10-CM

## 2021-10-21 MED ORDER — ATORVASTATIN CALCIUM 40 MG PO TABS: 40.0000 mg | ORAL_TABLET | Freq: Every day | ORAL | 2 refills | Status: DC

## 2021-10-21 MED ORDER — GABAPENTIN 300 MG PO CAPS: 300.0000 mg | ORAL_CAPSULE | Freq: Three times a day (TID) | ORAL | 2 refills | Status: DC

## 2021-10-21 MED ORDER — LEVETIRACETAM 500 MG PO TABS: 500.0000 mg | ORAL_TABLET | Freq: Two times a day (BID) | ORAL | 1 refills | Status: DC

## 2021-10-21 NOTE — Patient Instructions (Signed)
Follow up with your specialty providers as scheduled ?I want to see you in approx 4 months for your physical and blood work ?Follow up sooner if you need me ?

## 2021-10-21 NOTE — Assessment & Plan Note (Signed)
Patient has a history of cocaine use.  States she does not snore but she does smoke it states that she uses it approximately once a week states she uses it because she gets bored ?

## 2021-10-21 NOTE — Assessment & Plan Note (Signed)
Patient is on gabapentin 300 mg 3 times daily for extended period of time.  States in the past she has been on as much as 3600 mg daily.  She did request a dose increase today and she states that the gabapentin does not seem to be working currently.  Also asked if we can change from gabapentin to pregabalin.  After reviewing last hospital note recommended reduction in sedating medications as she is on several behavioral health meds that can be sedating along with gabapentin.  Declined to titrate up on medication currently.  We can always see what neurology states when she sees them ?

## 2021-10-21 NOTE — Progress Notes (Signed)
? ?New Patient Office Visit ? ?Subjective:  ?Patient ID: Molly Dennis, female    DOB: 03-Apr-1971  Age: 51 y.o. MRN: [id number] ? ?CC:  ?Chief Complaint  ?Patient presents with  ? Establish Care  ?  Previous PCP with [facility] Hermenia FiscalGambly-in the chart  ? Medication Refill  ? ? ?HPI ?Molly Dennis presents for Establish care ? ?[facility] Strattera, Seroquel, amitriptyline, and duloxetine. States that she just recently started back on the duloxetine and has appointment this week [facility]. PHQ-9 and GAD-7 administered in office.  Patient states she does have a history of suicide attempt many times with pills being the method.  She has been hospitalized for the same.  No current HI SI AVH. ? 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  ? ?Seizures:  Last seizure to ED not followed by neurology. Has been on keppra. Did have an rx that was printed but was not signed. Patient states that she is not out yet. I will bridge her until she gets into neurology   ? ?Asthma/copd: States that she has wixela and proair respiclick. States that she uses albuterol approx 3 times a day. Just got the wixela inhaler and will start using it today. Did inform patient to rinse mouth after each use.  ? ?GERD: Prolisec 20mg  BID. States that she will have heart burn and make herself throw up. ? ?HLD: takes atorvastatin at night and does well ? ?Eats approx 1 meal a day. Not a big snacker. States that she does not exercise yet. Currenlty in betwwen a move ? ?Plastic: Evelena LeydenGarrett Green., Foster Simpsonlaire Dillingham. Has appointment this Friday to follow up on right heel dressing. Was wrapped in office did not unwrap to see. States every other day dressing changes with home health  ?Orthopedist: Dr. Jena GaussHaddix ? ? ?Immunizations: ?-Tetanus:01/2021 ?-Influenza:0101/203 ?-Covid-19 J&J and one bosster: ?-Shingles: info given  ?-Pneumonia:  ? ?-HPV: Aged ? ?Diet: Fair diet.  ?Exercise: No regular exercise. ? ?Eye  exam: Needs updating  ?Dental exam: Currenlty working with   ? ?Pap Smear: Completed in 2022 ?Mammogram: Completed in ordered ?Colonoscopy: Completed in [place] ?Dexa:  ? ? ?Lung Cancer Screening: Lung nodules. referr ? ?Past Medical History:  ?Diagnosis Date  ? Anxiety   ? Asthma   ? COPD (chronic obstructive pulmonary disease) (HCC)   ? Depression   ? Difficult intravenous access   ? GERD (gastroesophageal reflux disease)   ? History of chickenpox   ? Seizures (HCC)   ? Smoker   ? Stroke [facility](HCC)   ? ? ?Past Surgical History:  ?Procedure Laterality Date  ? CHOLECYSTECTOMY    ? DEBRIDEMENT AND CLOSURE WOUND Right 05/21/2021  ? Procedure: Debridement and excision of right heel wound;  Surgeon: Peggye Formillingham, Claire S, DO;  Location: [facility];  Service: Plastics;  Laterality: Right;  ? FOOT SURGERY    ? HARDWARE REMOVAL Right 07/23/2021  ? Procedure: HARDWARE REMOVAL RIGHT FOOT;  Surgeon: Roby LoftsHaddix, Kevin P, MD;  Location: [facility];  Service: Orthopedics;  Laterality: Right;  ? I & D EXTREMITY Right 01/25/2021  ? Procedure: IRRIGATION AND DEBRIDEMENT RIGHT TIBIAL/FIBULA,, CALCANEUS;  Surgeon: Samson FredericSwinteck, Brian, MD;  Location: [facility];  Service: Orthopedics;  Laterality: Right;  ? ORIF CALCANEOUS FRACTURE Right 01/27/2021  ? Procedure: OPEN REDUCTION INTERNAL FIXATION (ORIF) CALCANEOUS FRACTURE;  Surgeon: Roby LoftsHaddix, Kevin P, MD;  Location: [facility];  Service: Orthopedics;  Laterality: Right;  ? ORIF TIBIA PLATEAU  Right 01/27/2021  ? Procedure: OPEN REDUCTION INTERNAL FIXATION (ORIF) TIBIAL PLATEAU;  Surgeon: Roby Lofts, MD;  Location: [facility];  Service: Orthopedics;  Laterality: Right;  ? SHOULDER SURGERY    ? TIBIA IM NAIL INSERTION Right 01/27/2021  ? Procedure: INTRAMEDULLARY (IM) NAIL TIBIAL;  Surgeon: Roby Lofts, MD;  Location: [facility];  Service: Orthopedics;  Laterality: Right;  ? ? ?Family History  ?Problem Relation Age of Onset  ? Hypertension Mother   ? Heart disease Mother   ? Hearing loss Mother   ?  COPD Mother   ? Asthma Mother   ? Arthritis Mother   ? Alcohol abuse Father   ? Drug abuse Sister   ? Diabetes Sister   ? Depression Sister   ? Drug abuse Sister   ? Depression Sister   ? COPD Sister   ? Asthma Sister   ? Diabetes Maternal Grandfather   ? Diabetes Paternal Grandmother   ? Alcohol abuse Paternal Grandfather   ? ? ?Social History  ? ?Socioeconomic History  ? Marital status: Divorced  ?  Spouse name: Not on file  ? Number of children: 0  ? Years of education: Not on file  ? Highest education level: Not on file  ?Occupational History  ? Not on file  ?Tobacco Use  ? Smoking status: Every Day  ?  Packs/day: 0.50  ?  Years: 36.00  ?  Pack years: 18.00  ?  Types: Cigarettes  ? Smokeless tobacco: Never  ?Vaping Use  ? Vaping Use: Every day  ?Substance and Sexual Activity  ? Alcohol use: Not Currently  ?  Comment: none since going into SNF 01-2021  ? Drug use: Yes  ?  Types: Marijuana, Cocaine  ?  Comment: goes to [employer]  ? Sexual activity: Not Currently  ?Other Topics Concern  ? Not on file  ?Social History Narrative  ? Not currenlty employed  ? ?Social Determinants of Health  ? ?Financial Resource Strain: Not on file  ?Food Insecurity: Not on file  ?Transportation Needs: Not on file  ?Physical Activity: Not on file  ?Stress: Not on file  ?Social Connections: Not on file  ?Intimate Partner Violence: Not on file  ? ? ?ROS ?Review of Systems  ?Constitutional:  Negative for chills and fever.  ?Respiratory:  Positive for shortness of breath. Negative for cough.   ?Cardiovascular:  Positive for leg swelling. Negative for chest pain.  ?Gastrointestinal:  Positive for vomiting (Secondary to GERD).  ?Musculoskeletal:  Positive for arthralgias, gait problem and joint swelling.  ?Neurological:  Positive for seizures, facial asymmetry and weakness.  ?Psychiatric/Behavioral:  Negative for self-injury (hx of same several times) and suicidal ideas.   ? ?Objective:  ? ?Today's Vitals: BP 90/62   Pulse  95   Temp (!) 96.6 ?F (35.9 ?C)   Resp 12   Ht 5\' 3"  (1.6 m)   Wt 170 lb 2 oz (77.2 kg)   LMP  (LMP Unknown)   SpO2 97%   BMI 30.14 kg/m?  ? ?Physical Exam ?Vitals and nursing note reviewed.  ?Constitutional:   ?   Appearance: Normal appearance.  ?HENT:  ?   Right Ear: Tympanic membrane, ear canal and external ear normal.  ?   Left Ear: Tympanic membrane, ear canal and external ear normal.  ?   Mouth/Throat:  ?   Mouth: Mucous membranes are moist.  ?   Pharynx: Oropharynx is clear.  ?Eyes:  ?   Extraocular  Movements: Extraocular movements intact.  ?   Pupils: Pupils are equal, round, and reactive to light.  ?Neck:  ?   Thyroid: No thyroid mass, thyromegaly or thyroid tenderness.  ?Cardiovascular:  ?   Rate and Rhythm: Normal rate and regular rhythm.  ?   Pulses: Normal pulses.  ?   Heart sounds: Normal heart sounds.  ?Pulmonary:  ?   Effort: Pulmonary effort is normal.  ?   Breath sounds: Normal breath sounds.  ?Abdominal:  ?   General: Bowel sounds are normal.  ?Musculoskeletal:  ?   Right lower leg: Edema present.  ?   Left lower leg: No edema.  ?Lymphadenopathy:  ?   Cervical: No cervical adenopathy.  ?Skin: ?   General: Skin is warm.  ?Neurological:  ?   Mental Status: She is alert. Mental status is at baseline.  ?   Motor: Weakness present.  ?   Gait: Gait abnormal.  ?   Deep Tendon Reflexes: Reflexes abnormal.  ?   Reflex Scores: ?     Bicep reflexes are 2+ on the right side and 2+ on the left side. ?     Patellar reflexes are 0 on the right side and 2+ on the left side. ?   Comments: Bilateral lower extremity extremity 5/5 ?Right upper extremity strength 5/5 left upper extremity strength 4/5 decreased in grip on left side also. ? ?Patient also has left-sided facial droop.  Alongside some speech deficits from my perception.  ?Psychiatric:     ?   Mood and Affect: Mood normal.     ?   Behavior: Behavior normal.     ?   Thought Content: Thought content normal.     ?   Judgment: Judgment normal.   ? ? ?Assessment & Plan:  ? ?Problem List Items Addressed This Visit   ? ?  ? Digestive  ? Gastroesophageal reflux disease  ? Relevant Orders  ? Ambulatory referral to Gastroenterology  ?  ? Nervous and Auditory  ? Neuropath

## 2021-10-21 NOTE — Telephone Encounter (Signed)
PATIENT WANTED ME TO CALL HER BACK ?

## 2021-10-21 NOTE — Assessment & Plan Note (Signed)
Historical diagnosis and patient confirms same does have a history of approximately taking pack years.  Currently smokes but not as much but does use electronic cigarettes/vaping ambulatory referral for low-dose CT scan placed ?

## 2021-10-21 NOTE — Assessment & Plan Note (Signed)
History of a CVA in approximately 2012.  Looking back looks like a carotid dissection that might of caused stroke.  Patient currently on atorvastatin 40 mg currently.  Patient does have left-sided residuals in regards to left facial droop, distorted speech, left-sided upper and lower extremity weakness.  Did fill out a permanent handicap placard for patient as she does have difficulty ambulating with a left-sided weakness patient is also suffered many orthopedic surgeries and traumas that inhibit her ability to walk for long distances.  Copy scanned to chart ?

## 2021-10-21 NOTE — Assessment & Plan Note (Signed)
History of same.  Patient was recently hospitalized on 09/17/2021 for seizure.  Patient states she had a mostly here when comes to the levetiracetam.  Patient states she has not followed by neurology currently.  We will cover her by filling her levetiracetam 500 mg twice daily for 30 days with 1 refill ambulatory referral to neurology placed ?

## 2021-10-23 ENCOUNTER — Telehealth: Payer: Self-pay

## 2021-10-23 NOTE — Telephone Encounter (Signed)
CALLED PATIENT NO ANSWER LEFT VOICEMAIL FOR A CALL BACK °Letter sent °

## 2021-10-28 ENCOUNTER — Other Ambulatory Visit: Payer: Self-pay

## 2021-10-28 ENCOUNTER — Encounter: Payer: Self-pay | Admitting: Plastic Surgery

## 2021-10-28 ENCOUNTER — Ambulatory Visit (INDEPENDENT_AMBULATORY_CARE_PROVIDER_SITE_OTHER): Payer: Medicare Other | Admitting: Plastic Surgery

## 2021-10-28 DIAGNOSIS — Z716 Tobacco abuse counseling: Secondary | ICD-10-CM | POA: Diagnosis not present

## 2021-10-28 DIAGNOSIS — G629 Polyneuropathy, unspecified: Secondary | ICD-10-CM

## 2021-10-28 DIAGNOSIS — S91301D Unspecified open wound, right foot, subsequent encounter: Secondary | ICD-10-CM | POA: Diagnosis not present
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
## 2021-10-30 ENCOUNTER — Other Ambulatory Visit: Payer: Self-pay | Admitting: Student

## 2021-10-30 DIAGNOSIS — S82141D Displaced bicondylar fracture of right tibia, subsequent encounter for closed fracture with routine healing: Secondary | ICD-10-CM

## 2021-11-04 ENCOUNTER — Telehealth: Payer: Medicare Other | Admitting: Surgical

## 2021-11-05 ENCOUNTER — Encounter (HOSPITAL_BASED_OUTPATIENT_CLINIC_OR_DEPARTMENT_OTHER): Payer: Medicare Other | Attending: Internal Medicine | Admitting: Physician Assistant

## 2021-11-11 ENCOUNTER — Ambulatory Visit (INDEPENDENT_AMBULATORY_CARE_PROVIDER_SITE_OTHER): Payer: Medicare Other | Admitting: Surgical

## 2021-11-11 DIAGNOSIS — S91301D Unspecified open wound, right foot, subsequent encounter: Secondary | ICD-10-CM | POA: Diagnosis not present
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
## 2021-11-21 ENCOUNTER — Inpatient Hospital Stay: Admission: RE | Admit: 2021-11-21 | Payer: Medicare Other | Source: Ambulatory Visit

## 2021-11-27 ENCOUNTER — Telehealth: Payer: Self-pay | Admitting: *Deleted

## 2021-11-27 NOTE — Telephone Encounter (Signed)
Received on (09/05/21) via of fax Home Health Certification and Plan of Care from [facility] requesting signature and return.  Given to provider to sign. ? ?Home Health Certification and Plan of Care signed and faxed back to [facility].  Confirmation received and copy scanned into the chart.//AB/CMA ?

## 2021-12-03 ENCOUNTER — Telehealth: Payer: Self-pay

## 2021-12-03 NOTE — Telephone Encounter (Signed)
Call was transferred from [facility], Production designer, theatre/television/film. Spoke to [place], Charity fundraiser with [facility]. She needed verbals to continue wound dressings x one more week. She also stated she was going to increase to going out to see pt twice per week; I confirmed verbals.  ?

## 2021-12-05 ENCOUNTER — Telehealth: Payer: Self-pay

## 2021-12-05 NOTE — Telephone Encounter (Signed)
Close  

## 2021-12-10 ENCOUNTER — Other Ambulatory Visit: Payer: Self-pay | Admitting: Nurse Practitioner

## 2021-12-10 DIAGNOSIS — R569 Unspecified convulsions: Secondary | ICD-10-CM

## 2021-12-10 NOTE — Telephone Encounter (Signed)
Per chart in April [facility] called patient and patient said she would need to call back to discuss scheduling and nothing further is noted. ?Called patient and left her a message to discuss ?

## 2021-12-12 NOTE — Telephone Encounter (Signed)
Fyi to [facility], still trying to reach, when calling patient and her niece today the phone line keeps saying "I'm sorry your call can not be completed as dialed." I called her sister and her mom's number on file and neither answered and both voicemail were full ?

## 2021-12-15 NOTE — Telephone Encounter (Signed)
I also called [facility] and they put a note on their end also that we need to speak with patient ?

## 2021-12-17 ENCOUNTER — Encounter: Payer: Self-pay | Admitting: Nurse Practitioner

## 2021-12-17 NOTE — Telephone Encounter (Signed)
Called all the numbers in the chart not able to reach patient. Letter mailed. ?

## 2021-12-30 ENCOUNTER — Telehealth: Payer: Self-pay | Admitting: *Deleted

## 2021-12-30 NOTE — Telephone Encounter (Signed)
Received on (12/07/2021) via of fax Home Health Certification and Plan of Care from [facility].  Requesting orders to be signed,dated, and returned.  Given to provider to sign and date.     Home Health Certification and Plan of Care was signed,dated, and faxed back to [facility].  Confirmation received and copy scanned into the chart.//AB/CMA

## 2022-01-14 ENCOUNTER — Encounter: Payer: Self-pay | Admitting: Surgical

## 2022-01-14 ENCOUNTER — Telehealth: Payer: Self-pay

## 2022-01-14 ENCOUNTER — Ambulatory Visit (INDEPENDENT_AMBULATORY_CARE_PROVIDER_SITE_OTHER): Payer: Medicare Other | Admitting: Surgical

## 2022-01-14 DIAGNOSIS — S91301D Unspecified open wound, right foot, subsequent encounter: Secondary | ICD-10-CM

## 2022-01-14 DIAGNOSIS — F17210 Nicotine dependence, cigarettes, uncomplicated: Secondary | ICD-10-CM | POA: Diagnosis not present
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
## 2022-01-14 NOTE — Telephone Encounter (Signed)
Do the forms need to be filled out by me?

## 2022-01-14 NOTE — Progress Notes (Signed)
   Referring Provider Eden Emms, NP [address] [place],  [place] [address]   CC: No chief complaint on file.     Molly Dennis is an 51 y.o. female.  HPI: Patient is a 51 year old female here for follow-up on her right heel wound.  She was last seen on 11/11/2021, reports she has been receiving home health assistance with dressing changes once per week.  She reports home health is requesting increased visits per week up to 2.  She reports she is unable to change her dressing on her own due to her history of a stroke causing decreased function of her left hand.  She reports that she did not show up for her appointment at the wound care center and when discussing rescheduling with wound care center she did not want to reschedule as the next available appointment was 3 to 4 months later.  She reports she is still living in the boardinghouse, reports she is doing well.  She reports that she is still smoking about half a pack per day of cigarettes as well as vaping with nicotine.  She is ambulating in the office today with croc shoes on.  She reports some mild tenderness to the plantar aspect of her heel, no other complaints.  Denies any infectious symptoms.  Review of Systems General: No fevers or chills  Physical Exam    10/21/2021   10:40 AM 09/18/2021    8:25 AM 09/18/2021    3:56 AM  Vitals with BMI  Height 5\' 3"     Weight 170 lbs 2 oz    BMI 30.14    Systolic 90 117 121  Diastolic 62 99 73  Pulse 95 94 96    General:  No acute distress,  Alert and oriented, Non-Toxic, Normal speech and affect Right heel with desiccated skin at the periphery of the wound, granulation tissue is present within the wound bed that is approximately 2 x 2 cm.  There is no surrounding erythema or cellulitic changes noted.  There is a smaller wound on the plantar aspect of her heel that is approximately 2 x 2 mm.  No surrounding erythema or cellulitic changes.  No foul odor is  noted.  Assessment/Plan Provided patient with prescription to increase home health visits to twice weekly with collagen and Xeroform dressing changes.  She is ambulating, despite having some pain with ambulation due to the wound.  We discussed smoking cessation, discussed the delayed healing that may result.  Recommend following up in 2 months for reevaluation.  Pictures were placed in the patient's chart with patient's permission.  No signs of concern on exam.  Call with questions or concerns.    Jeannia Tatro 01/14/2022, 10:06 AM

## 2022-01-14 NOTE — Telephone Encounter (Signed)
I still have the forms. Not able to reach patient. Will let provider review this message.

## 2022-01-14 NOTE — Telephone Encounter (Signed)
I am confused as to what the patient is needing. She saw plastic surgery today and was given updated "script" for home health twice a week to help with wound changes as she has difficulty doing it herself because of a previous stroke. This form is asking for an independent assessment for an in home aide?? What is she needing help with and this is separate from the wound care folks? Also per noted from plastics she is living in a boarding house currenlty

## 2022-01-15 NOTE — Telephone Encounter (Signed)
Spoke with Marcelino Duster from Home aide services, this request is for personal home aid to help with bathing, cleaning, etc. After they received the forms they would process to see if she is eligible based on the information. She has not gotten this service from them yet. This is separate from [facility] orders for wound check/change. This is due to the lingering symptom after having a stroke. Marcelino Duster states as far as she knows patient is living in boarding house currently.

## 2022-01-19 ENCOUNTER — Ambulatory Visit: Payer: Medicare Other | Admitting: Podiatry

## 2022-01-19 NOTE — Telephone Encounter (Signed)
Form reviewed, filled out and signed. Placed on your desk to be faxed

## 2022-01-19 NOTE — Telephone Encounter (Signed)
Form faxed

## 2022-01-21 ENCOUNTER — Other Ambulatory Visit: Payer: Self-pay | Admitting: Nurse Practitioner

## 2022-01-21 DIAGNOSIS — G629 Polyneuropathy, unspecified: Secondary | ICD-10-CM

## 2022-01-21 DIAGNOSIS — Z8673 Personal history of transient ischemic attack (TIA), and cerebral infarction without residual deficits: Secondary | ICD-10-CM

## 2022-01-22 ENCOUNTER — Telehealth: Payer: Self-pay | Admitting: Nurse Practitioner

## 2022-01-22 NOTE — Telephone Encounter (Signed)
Pt friend came in and dropped off form for labs (from [employer]) [phone].  Pt gave verbal permission for the friend to be contacted due to pt not having a phone or transportation (friend is her method of contact and transportation). Friends information is [phone] [employer].

## 2022-01-22 NOTE — Telephone Encounter (Signed)
Spoke with [name] and asked how long and often patient has been going to the [employer], [name] said patient started about a week or 2 ago. They asked patient to take the lab order to her PCP and have labs drawn. [name] did not have any other information. Placed paper in Matt's inbox.

## 2022-01-23 ENCOUNTER — Ambulatory Visit: Payer: Medicare Other | Admitting: Podiatry

## 2022-01-23 NOTE — Telephone Encounter (Signed)
Will review form

## 2022-01-23 NOTE — Telephone Encounter (Signed)
Have not called Molly Dennis since speaking with her yesterday. Will call once I have feedback from provider on the form

## 2022-01-26 ENCOUNTER — Telehealth: Payer: Self-pay | Admitting: Nurse Practitioner

## 2022-01-26 ENCOUNTER — Other Ambulatory Visit: Payer: Self-pay | Admitting: Nurse Practitioner

## 2022-01-26 DIAGNOSIS — F411 Generalized anxiety disorder: Secondary | ICD-10-CM

## 2022-01-26 DIAGNOSIS — F331 Major depressive disorder, recurrent, moderate: Secondary | ICD-10-CM

## 2022-01-26 DIAGNOSIS — F319 Bipolar disorder, unspecified: Secondary | ICD-10-CM

## 2022-01-26 NOTE — Telephone Encounter (Addendum)
Molly Dennis called to follow up on labs, She scheduled for labs for patient tomorrow at 1pm. She said she was unsure what was on the paperwork that was brought and was just told that patient needed to have lab work done as soon as possible. She said we would probably need to call whoever was on the paperwork to verify

## 2022-01-27 ENCOUNTER — Other Ambulatory Visit (INDEPENDENT_AMBULATORY_CARE_PROVIDER_SITE_OTHER): Payer: Medicare Other

## 2022-01-27 DIAGNOSIS — F411 Generalized anxiety disorder: Secondary | ICD-10-CM | POA: Diagnosis not present

## 2022-01-27 DIAGNOSIS — F331 Major depressive disorder, recurrent, moderate: Secondary | ICD-10-CM

## 2022-01-27 DIAGNOSIS — F319 Bipolar disorder, unspecified: Secondary | ICD-10-CM

## 2022-01-27 LAB — CBC WITH DIFFERENTIAL/PLATELET
Basophils Absolute: 0.1 10*3/uL (ref 0.0–0.1)
Basophils Relative: 1.1 % (ref 0.0–3.0)
Eosinophils Absolute: 0.2 10*3/uL (ref 0.0–0.7)
Eosinophils Relative: 2.6 % (ref 0.0–5.0)
HCT: 40.8 % (ref 36.0–46.0)
Hemoglobin: 13.4 g/dL (ref 12.0–15.0)
Lymphocytes Relative: 29.7 % (ref 12.0–46.0)
Lymphs Abs: 2.3 10*3/uL (ref 0.7–4.0)
MCHC: 33 g/dL (ref 30.0–36.0)
MCV: 91.2 fl (ref 78.0–100.0)
Monocytes Absolute: 0.7 10*3/uL (ref 0.1–1.0)
Monocytes Relative: 9.4 % (ref 3.0–12.0)
Neutro Abs: 4.4 10*3/uL (ref 1.4–7.7)
Neutrophils Relative %: 57.2 % (ref 43.0–77.0)
Platelets: 381 10*3/uL (ref 150.0–400.0)
RBC: 4.47 Mil/uL (ref 3.87–5.11)
RDW: 14.8 % (ref 11.5–15.5)
WBC: 7.7 10*3/uL (ref 4.0–10.5)

## 2022-01-27 LAB — COMPREHENSIVE METABOLIC PANEL
ALT: 8 U/L (ref 0–35)
AST: 11 U/L (ref 0–37)
Albumin: 3.4 g/dL — ABNORMAL LOW (ref 3.5–5.2)
Alkaline Phosphatase: 109 U/L (ref 39–117)
BUN: 15 mg/dL (ref 6–23)
CO2: 29 mEq/L (ref 19–32)
Calcium: 8.9 mg/dL (ref 8.4–10.5)
Chloride: 101 mEq/L (ref 96–112)
Creatinine, Ser: 0.83 mg/dL (ref 0.40–1.20)
GFR: 81.52 mL/min (ref >60.00)
Glucose, Bld: 113 mg/dL — ABNORMAL HIGH (ref 70–99)
Potassium: 3.6 mEq/L (ref 3.5–5.1)
Sodium: 138 mEq/L (ref 135–145)
Total Bilirubin: 0.3 mg/dL (ref 0.2–1.2)
Total Protein: 6.5 g/dL (ref 6.0–8.3)

## 2022-01-27 LAB — AMYLASE: Amylase: 14 U/L — ABNORMAL LOW (ref 27–131)

## 2022-01-27 NOTE — Telephone Encounter (Signed)
See other phone note

## 2022-02-02 ENCOUNTER — Telehealth: Payer: Self-pay | Admitting: *Deleted

## 2022-02-02 ENCOUNTER — Telehealth: Payer: Self-pay

## 2022-02-02 NOTE — Telephone Encounter (Signed)
Received on (02/01/2022) via of fax Physician Orders from [facility].  Requesting signature and return.  Given to provider to sign.  Physician Orders signed and faxed back to [facility].  Confirmation received and copy scanned into the chart.//AB/CMA

## 2022-02-02 NOTE — Telephone Encounter (Signed)
Per Alcario Drought, RN with [facility]-they went to patient's appointment for today's dressing change and no one answered the door or the phone. She will try again at the Wednesday scheduled time.

## 2022-02-04 ENCOUNTER — Telehealth (INDEPENDENT_AMBULATORY_CARE_PROVIDER_SITE_OTHER): Payer: Self-pay | Admitting: Surgical

## 2022-02-04 DIAGNOSIS — S91301A Unspecified open wound, right foot, initial encounter: Secondary | ICD-10-CM

## 2022-02-04 NOTE — Telephone Encounter (Signed)
Patient is a 51 year old female with a right heel wound.  She has been receiving home health assistance with dressing changes from home health RN.received a call from [place], Charity fundraiser at [employer] today to discuss patient's care.  She reports patient has had a poor history of being able to easily contact.  She reports in the past she has had difficulty contacting the patient and providing services due to the patient not answering her phone.  She reports that she has called the patient multiple times this week for her appointments and the patient has not answered her phone.  Of note, the patient lives in a group home and [place], [place] reports she has previously been able to go to the group home and assist the patient via direct contact, however has had no success in reaching the patient.  She reports that patient was coming up on recertification for needs for home health, however today is the last day and therefore she is going to have to discharge the patient from their services as she is not answering the phone.  She reports she is also called the patient's emergency contacts which is her sister and mother.  She has not received any information from the family regarding the patient.  I discussed with Alcario Drought, RN that we appreciate their help, we will update them if we hear anything from the patient.

## 2022-02-06 NOTE — Telephone Encounter (Addendum)
Patient's friend called in stating that Molly Dennis has not yet received the paperwork. I have re-faxed this information to them with the fax of [phone] and have gotten a successful confirmation. [name] will be coming by to pick up a copy on Monday.

## 2022-02-09 ENCOUNTER — Telehealth: Payer: Self-pay | Admitting: Nurse Practitioner

## 2022-02-09 DIAGNOSIS — S91301D Unspecified open wound, right foot, subsequent encounter: Secondary | ICD-10-CM

## 2022-02-09 NOTE — Telephone Encounter (Signed)
Patients friend Lauro Regulus called and said she took patient to [facility] and they said she needs to go to wound care for her right foot and they told her to contact us to have [place] put in the referral. She said patients phone isnt working right now. Callback is [phone].

## 2022-02-09 NOTE — Telephone Encounter (Signed)
Molli Hazard,  I know you all have been dealing with Dannica her her heel wound. I have seen in the notes where you have offered to refer her to wound care but she has not went. I see that you mentioned that she had an appointment with wound care but I do not see a referral. She has a friend/caregiver helping her out named [name] and I got a note that states the urgent care requests she goes to wound center for this. I am going to defer to you as you guys are the ones managing the wound. Thanks for your help  Audria Nine, DNP, AGNP-C

## 2022-02-10 NOTE — Telephone Encounter (Signed)
I added a note to the referral stating to call [name] to schedule.

## 2022-02-11 ENCOUNTER — Telehealth: Payer: Self-pay | Admitting: *Deleted

## 2022-02-11 NOTE — Telephone Encounter (Signed)
Received on (10/14/2021) via of fax Home Health Certification and Plan of Care from [facility] requesting signature and return.  Given to provider to sign and return.     Home Health Certification and Plan of Care signed and faxed back to [facility].  Confirmation received and copy scanned into the chart.//AB/CMA

## 2022-02-18 ENCOUNTER — Other Ambulatory Visit: Payer: Self-pay | Admitting: Nurse Practitioner

## 2022-02-18 DIAGNOSIS — F319 Bipolar disorder, unspecified: Secondary | ICD-10-CM

## 2022-02-18 DIAGNOSIS — F331 Major depressive disorder, recurrent, moderate: Secondary | ICD-10-CM

## 2022-02-19 ENCOUNTER — Other Ambulatory Visit: Payer: Self-pay | Admitting: Nurse Practitioner

## 2022-02-19 DIAGNOSIS — R569 Unspecified convulsions: Secondary | ICD-10-CM

## 2022-02-19 NOTE — Telephone Encounter (Signed)
Spoke with [facility] pharmacist, they were following up on refill medication for Keppra. Advised pharmacist that this medication needs to go through neurology but patient has not established with them (did not call them back to schedule) and the issues with getting in touch with patient.  Patient has an appointment on 02/23/22 with Audria Nine and I advised pharmacist I would let Matt know and see if he will refill one more time or will wait until patient comes in for an appointment.

## 2022-02-19 NOTE — Telephone Encounter (Signed)
She would be due on 02/24/22 for a refill but the pharmacy was requesting earlier because they make the medication packet for her and send it out ahead of time

## 2022-02-23 ENCOUNTER — Ambulatory Visit: Payer: Medicare Other | Admitting: Nurse Practitioner

## 2022-02-23 NOTE — Telephone Encounter (Signed)
Patient's friend Molly Dennis [phone]) on DPR is calling about Molly Dennis's wound appointment  Left a voice message for Wound healing at [facility] [phone]) to ask them to call Molly Dennis to set up an appointment.

## 2022-02-26 ENCOUNTER — Other Ambulatory Visit: Payer: Medicare Other

## 2022-03-02 ENCOUNTER — Telehealth: Payer: Self-pay | Admitting: Nurse Practitioner

## 2022-03-02 ENCOUNTER — Ambulatory Visit
Admission: EM | Admit: 2022-03-02 | Discharge: 2022-03-02 | Disposition: A | Discharge status: Home / Self Care | Payer: Medicare Other | Attending: Emergency Medicine | Admitting: Emergency Medicine

## 2022-03-02 DIAGNOSIS — L89612 Pressure ulcer of right heel, stage 2: Secondary | ICD-10-CM | POA: Diagnosis not present

## 2022-03-02 MED ORDER — DOXYCYCLINE HYCLATE 100 MG PO CAPS: 100.0000 mg | ORAL_CAPSULE | Freq: Two times a day (BID) | ORAL | 0 refills | Status: AC

## 2022-03-02 NOTE — Telephone Encounter (Signed)
[name] calling to schedule an appointment, states that patient's foot wound seems to be spreading up her leg and is looking as if it is infected  Patient has a wound appointment on 8.10.23, advised cheryl to call wound and see if they can get her in sooner  Patient will still not wake up before 12 noon
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
## 2022-03-02 NOTE — Telephone Encounter (Signed)
Noted. Also her wound has been managed and followed by Plastic surgery

## 2022-03-02 NOTE — Telephone Encounter (Signed)
Spoke to [place] ([place]) by telephone and was advised that she called the wound center and they can not get the patient in today. [name] stated that the patient's foot has gotten worse and there are streaks running up her leg. [name] was advised that patient should go to an UC or ED to be evaluated today. [name] stated that the  patient will not get up until around 1:00. [name] stated that she will get the patient to an Urgent Care today. [name] was given information on the [facility] in [place] on [address]. [name] will make sure patient goes to UC as soon as she gets up.

## 2022-03-02 NOTE — Discharge Instructions (Signed)
Today you are being treated for the wound to your foot  Take doxycycline every morning and every evening for the next 10 days, this will provide bacterial coverage and if enough medicine to get you to your wound care appointment  You may cover area with collagen dressing, if unable to get collagen dressing you may cover with a nonstick padded dressing  Keep area clean, clean daily with diluted soapy water, pat dry and cover with bandage  Elevate foot and allow it to flow off of pillows when sitting and lying to keep pressure off of the wound  Ensure that you keep your upcoming appointment with wound care  At any point if you start to notice increased drainage, fever or chills you will need to go to the nearest emergency department for immediate evaluation

## 2022-03-03 ENCOUNTER — Other Ambulatory Visit: Payer: Medicare Other

## 2022-03-03 ENCOUNTER — Other Ambulatory Visit: Payer: Self-pay | Admitting: Nurse Practitioner

## 2022-03-03 DIAGNOSIS — F319 Bipolar disorder, unspecified: Secondary | ICD-10-CM

## 2022-03-03 NOTE — Addendum Note (Signed)
Addended by: Alvina Chou on: 03/03/2022 10:05 AM   Modules accepted: Orders

## 2022-03-06 ENCOUNTER — Emergency Department: Payer: Medicare Other

## 2022-03-06 ENCOUNTER — Emergency Department
Admission: EM | Admit: 2022-03-06 | Discharge: 2022-03-06 | Disposition: A | Discharge status: Home / Self Care | Payer: Medicare Other | Attending: Emergency Medicine | Admitting: Emergency Medicine

## 2022-03-06 ENCOUNTER — Other Ambulatory Visit: Payer: Self-pay

## 2022-03-06 DIAGNOSIS — E876 Hypokalemia: Secondary | ICD-10-CM | POA: Insufficient documentation

## 2022-03-06 DIAGNOSIS — Z20822 Contact with and (suspected) exposure to covid-19: Secondary | ICD-10-CM | POA: Insufficient documentation

## 2022-03-06 DIAGNOSIS — L97519 Non-pressure chronic ulcer of other part of right foot with unspecified severity: Secondary | ICD-10-CM | POA: Insufficient documentation

## 2022-03-06 DIAGNOSIS — J449 Chronic obstructive pulmonary disease, unspecified: Secondary | ICD-10-CM | POA: Insufficient documentation

## 2022-03-06 DIAGNOSIS — S299XXA Unspecified injury of thorax, initial encounter: Secondary | ICD-10-CM | POA: Diagnosis not present

## 2022-03-06 DIAGNOSIS — R4182 Altered mental status, unspecified: Secondary | ICD-10-CM | POA: Insufficient documentation

## 2022-03-06 LAB — BASIC METABOLIC PANEL
Anion gap: 14 (ref 5–15)
BUN: 16 mg/dL (ref 6–20)
CO2: 20 mmol/L — ABNORMAL LOW (ref 22–32)
Calcium: 9.6 mg/dL (ref 8.9–10.3)
Chloride: 104 mmol/L (ref 98–111)
Creatinine, Ser: 0.97 mg/dL (ref 0.44–1.00)
GFR, Estimated: >60 mL/min (ref >60)
Glucose, Bld: 94 mg/dL (ref 70–99)
Potassium: 3.3 mmol/L — ABNORMAL LOW (ref 3.5–5.1)
Sodium: 138 mmol/L (ref 135–145)

## 2022-03-06 LAB — CBC
HCT: 47.3 % — ABNORMAL HIGH (ref 36.0–46.0)
Hemoglobin: 15.6 g/dL — ABNORMAL HIGH (ref 12.0–15.0)
MCH: 30.5 pg (ref 26.0–34.0)
MCHC: 33 g/dL (ref 30.0–36.0)
MCV: 92.6 fL (ref 80.0–100.0)
Platelets: 376 10*3/uL (ref 150–400)
RBC: 5.11 MIL/uL (ref 3.87–5.11)
RDW: 14.3 % (ref 11.5–15.5)
WBC: 10.1 10*3/uL (ref 4.0–10.5)
nRBC: 0 % (ref 0.0–0.2)

## 2022-03-06 LAB — TROPONIN I (HIGH SENSITIVITY): Troponin I (High Sensitivity): 4 ng/L (ref <18)

## 2022-03-06 LAB — SARS CORONAVIRUS 2 BY RT PCR: SARS Coronavirus 2 by RT PCR: NEGATIVE

## 2022-03-06 MED ORDER — CEPHALEXIN 500 MG PO CAPS: 500.0000 mg | ORAL_CAPSULE | Freq: Two times a day (BID) | ORAL | 0 refills | Status: AC

## 2022-03-06 MED ORDER — IPRATROPIUM-ALBUTEROL 0.5-2.5 (3) MG/3ML IN SOLN
3.0000 mL | Freq: Once | RESPIRATORY_TRACT | Status: AC
  Administered 2022-03-06: 3 mL via RESPIRATORY_TRACT
  Filled 2022-03-06: qty 3

## 2022-03-06 NOTE — ED Notes (Signed)
Lavender and light green top sent. Called lab to inform that pt is a difficult stick.
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
## 2022-03-06 NOTE — ED Notes (Signed)
Attempted IV start, unable, pt. Very difficult stick. IV team consulted.

## 2022-03-06 NOTE — ED Notes (Signed)
Pt seen walking out by this RN with a female. This RN asked pt where she was going pt stated "I'm leaving." This RN asked pt to go back to room and speak with Fuller Plan MD. Pt continued to walk out. Pt alert and oriented with steady gait.

## 2022-03-11 ENCOUNTER — Telehealth: Payer: Self-pay | Admitting: Nurse Practitioner

## 2022-03-11 NOTE — Telephone Encounter (Signed)
Tried calling patient to  schedule Medicare Annual Wellness Visit (AWV) either virtually or phone   awvi 05/04/15 per [facility]  No answer    This should be a 45 minute visit.  I left my direct # [phone]

## 2022-03-12 ENCOUNTER — Ambulatory Visit: Payer: Medicare Other | Admitting: Physician Assistant

## 2022-03-18 ENCOUNTER — Ambulatory Visit: Payer: Medicare Other | Admitting: Surgical

## 2022-03-24 ENCOUNTER — Other Ambulatory Visit: Payer: Self-pay | Admitting: Nurse Practitioner

## 2022-03-24 DIAGNOSIS — R569 Unspecified convulsions: Secondary | ICD-10-CM

## 2022-03-25 NOTE — Telephone Encounter (Signed)
Their office called patient in April and patient told them she would have to call back but never did.

## 2022-03-30 ENCOUNTER — Other Ambulatory Visit: Payer: Medicare Other

## 2022-04-13 ENCOUNTER — Other Ambulatory Visit: Payer: Self-pay | Admitting: Nurse Practitioner

## 2022-04-13 DIAGNOSIS — G629 Polyneuropathy, unspecified: Secondary | ICD-10-CM

## 2022-04-13 DIAGNOSIS — Z8673 Personal history of transient ischemic attack (TIA), and cerebral infarction without residual deficits: Secondary | ICD-10-CM

## 2022-04-23 NOTE — Progress Notes (Deleted)
Patient cancelled

## 2022-04-24 ENCOUNTER — Ambulatory Visit: Payer: Medicare Other | Admitting: Physician Assistant

## 2022-04-24 NOTE — Progress Notes (Unsigned)
Referring Provider Molly Pitcher, NP [place],  [place] [address]   CC: No chief complaint on file.     Molly Dennis Dennis an 51 y.o. female.  HPI: Patient Dennis a 51 year old female s/p right heel debridement with placement of ACell performed by Dr. Marla Dennis 05/21/2021 who presents to clinic for follow-up.   She was referred to wound care 09/24/2021 given slow improvement and lack of need for surgical intervention.  She was then seen again here in clinic 01/14/2022 and per that provider, she had missed her wound care appointment and then chose not to reschedule.  She has since been in the ER on multiple occasions, most recently 03/06/2022.  At that time, small soft tissue ulceration was noted over posterior calcaneus with concern for possible osteomyelitis.  MRI was ordered, but patient eloped before it could be obtained.  Referral was made to podiatry, Dr. Posey Dennis.  According to her chart, she had missed an appointment with Molly Dennis 03/2022.   Today,   No Known Allergies  Outpatient Encounter Medications as of 04/27/2022  Medication Sig   albuterol (VENTOLIN HFA) 108 (90 Base) MCG/ACT inhaler Inhale 1-2 puffs into the lungs every 4 (four) hours as needed.   amitriptyline (ELAVIL) 50 MG tablet Take 50 mg by mouth at bedtime.   ARIPiprazole (ABILIFY) 10 MG tablet Take 10 mg by mouth daily.   atomoxetine (STRATTERA) 60 MG capsule Take 60 mg by mouth every morning.   atorvastatin (LIPITOR) 40 MG tablet TAKE 1 TABLET BY MOUTH DAILY   calcium carbonate (TUMS EX) 750 MG chewable tablet Chew 1 tablet by mouth every 4 (four) hours as needed for heartburn.   diclofenac Sodium (VOLTAREN) 1 % GEL Apply 1 application topically 4 (four) times daily as needed (right knee pain).   DULoxetine (CYMBALTA) 60 MG capsule Take 60 mg by mouth daily.   gabapentin (NEURONTIN) 300 MG capsule TAKE 1 CAPSULE BY MOUTH 3 TIMES DAILY   levETIRAcetam (KEPPRA) 500 MG tablet TAKE 1 TABLET BY MOUTH TWICE A  DAY   oxyCODONE (ROXICODONE) 5 MG immediate release tablet Take 1 tablet (5 mg total) by mouth every 6 (six) hours as needed for severe pain.   QUEtiapine (SEROQUEL) 400 MG tablet Take 400 mg by mouth 2 (two) times daily.   traMADol (ULTRAM) 50 MG tablet Take 50 mg by mouth 3 (three) times daily as needed.   WIXELA INHUB 100-50 MCG/ACT AEPB Inhale 1 puff into the lungs 2 (two) times daily.   No facility-administered encounter medications on file as of 04/27/2022.     Past Medical History:  Diagnosis Date   Anxiety    Asthma    COPD (chronic obstructive pulmonary disease) ([place])    Depression    Difficult intravenous access    GERD (gastroesophageal reflux disease)    History of chickenpox    Seizures ([place])    Smoker    Stroke [facility])     Past Surgical History:  Procedure Laterality Date   CHOLECYSTECTOMY     DEBRIDEMENT AND CLOSURE WOUND Right 05/21/2021   Procedure: Debridement and excision of right heel wound;  Surgeon: Molly Dennis;  Location: [place];  Service: Plastics;  Laterality: Right;   FOOT SURGERY     HARDWARE REMOVAL Right 07/23/2021   Procedure: HARDWARE REMOVAL RIGHT FOOT;  Surgeon: Molly Needles, MD;  Location: [place];  Service: Orthopedics;  Laterality: Right;   I & D EXTREMITY Right  01/25/2021   Procedure: IRRIGATION AND DEBRIDEMENT RIGHT TIBIAL/FIBULA,, CALCANEUS;  Surgeon: Molly Frederic, MD;  Location[facility];  Service: Orthopedics;  Laterality: Right;   ORIF CALCANEOUS FRACTURE Right 01/27/2021   Procedure: OPEN REDUCTION INTERNAL FIXATION (ORIF) CALCANEOUS FRACTURE;  Surgeon: Molly Lofts, MD;  Location: [facility];  Service: Orthopedics;  Laterality: Right;   ORIF TIBIA PLATEAU Right 01/27/2021   Procedure: OPEN REDUCTION INTERNAL FIXATION (ORIF) TIBIAL PLATEAU;  Surgeon: Molly Lofts, MD;  Location: [facility];  Service: Orthopedics;  Laterality: Right;   SHOULDER SURGERY     TIBIA IM NAIL INSERTION Right 01/27/2021   Procedure:  INTRAMEDULLARY (IM) NAIL TIBIAL;  Surgeon: Molly Lofts, MD;  [facility];  Service: Orthopedics;  Laterality: Right;    Family History  Problem Relation Age of Onset   Hypertension Mother    Heart disease Mother    Hearing loss Mother    COPD Mother    Asthma Mother    Arthritis Mother    Alcohol abuse Father    Drug abuse Sister    Diabetes Sister    Depression Sister    Drug abuse Sister    Depression Sister    COPD Sister    Asthma Sister    Diabetes Maternal Grandfather    Diabetes Paternal Grandmother    Alcohol abuse Paternal Grandfather     Social History   Social History Narrative   Not currenlty employed     Review of Systems General: Denies fevers or chills Cardio: Denies chest pain Pulmonary: Denies difficulty breathing  Physical Exam    03/06/2022   12:30 PM 03/06/2022   11:45 AM 03/06/2022   10:00 AM  Vitals with BMI  Systolic   128  Diastolic   84  Pulse 93 71 97    General:  No acute distress, nontoxic appearing  Respiratory: No increased work of breathing Neuro: Alert and oriented Psychiatric: Normal mood and affect   Assessment/Plan ***  Molly Dennis 04/27/2022, 10:38 AM

## 2022-04-27 ENCOUNTER — Ambulatory Visit: Payer: Medicare Other | Admitting: Physician Assistant

## 2022-04-27 DIAGNOSIS — S91301D Unspecified open wound, right foot, subsequent encounter: Secondary | ICD-10-CM

## 2022-04-28 ENCOUNTER — Other Ambulatory Visit: Payer: Self-pay | Admitting: Nurse Practitioner

## 2022-04-28 DIAGNOSIS — R569 Unspecified convulsions: Secondary | ICD-10-CM

## 2022-04-29 ENCOUNTER — Encounter: Payer: Self-pay | Admitting: Nurse Practitioner

## 2022-04-29 NOTE — Telephone Encounter (Signed)
Called pt, no answer. Left vm to call back  

## 2022-04-29 NOTE — Telephone Encounter (Signed)
Please call Malachy Mood, on dpr, [phone]. to schedule patient for visit with [facility]. Anytime

## 2022-04-29 NOTE — Telephone Encounter (Signed)
Noted, will need to update DPR on file, called patient's number not able to leave a message. Called patient's mom and left a message asking patient to call back, called her niece but the number would not dial out correctly. Letter mailed to the patient also.

## 2022-04-29 NOTE — Telephone Encounter (Signed)
[phone] # called back, Stated her name was Malachy Mood. Also stated she doesn't speak to the pt anymore. Requested for her # to be taken off pt's chart.
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
## 2022-05-06 ENCOUNTER — Encounter: Payer: Self-pay | Admitting: Surgical

## 2022-05-06 ENCOUNTER — Ambulatory Visit (INDEPENDENT_AMBULATORY_CARE_PROVIDER_SITE_OTHER): Payer: Medicare Other | Admitting: Surgical

## 2022-05-06 DIAGNOSIS — S92001S Unspecified fracture of right calcaneus, sequela: Secondary | ICD-10-CM

## 2022-05-06 DIAGNOSIS — Z716 Tobacco abuse counseling: Secondary | ICD-10-CM

## 2022-05-06 DIAGNOSIS — Z91198 Patient's noncompliance with other medical treatment and regimen for other reason: Secondary | ICD-10-CM | POA: Diagnosis not present

## 2022-05-06 DIAGNOSIS — S91301D Unspecified open wound, right foot, subsequent encounter: Secondary | ICD-10-CM | POA: Diagnosis not present

## 2022-05-06 DIAGNOSIS — S91301A Unspecified open wound, right foot, initial encounter: Secondary | ICD-10-CM

## 2022-05-07 ENCOUNTER — Telehealth: Payer: Self-pay | Admitting: Surgical

## 2022-05-07 NOTE — Telephone Encounter (Signed)
Molly Dennis called questioning if she needed antibiotics for her heel.  She was seen yesterday, no antibiotics were indicated at that time.  I did instruct her to continue follow-up plan with outpatient wound care.  She verbalized understanding and agreement to today's plan.

## 2022-05-07 NOTE — Telephone Encounter (Signed)
Pt is calling in to see if she can have her antibiotics called in to her pharmacy she was seen on yesterday.  Pharm:  Consulting civil engineer in [place], [place].

## 2022-05-14 ENCOUNTER — Ambulatory Visit: Payer: Medicare Other | Admitting: Podiatry

## 2022-05-15 ENCOUNTER — Telehealth: Payer: Self-pay | Admitting: Nurse Practitioner

## 2022-05-15 NOTE — Telephone Encounter (Signed)
Left message for patient to call back and schedule Medicare Annual Wellness Visit (AWV).  Pease offer to do virtually or by telephone.   No hx of AWV - eligible as of 05/04/2015  Please schedule at anytime with [facility].   45 minute appointment  Any questions, please contact me at [phone]

## 2022-05-19 ENCOUNTER — Ambulatory Visit: Payer: Medicare Other | Admitting: Podiatry

## 2022-05-22 ENCOUNTER — Ambulatory Visit (INDEPENDENT_AMBULATORY_CARE_PROVIDER_SITE_OTHER): Payer: Medicare Other | Admitting: Podiatry

## 2022-05-22 DIAGNOSIS — M79675 Pain in left toe(s): Secondary | ICD-10-CM

## 2022-05-22 DIAGNOSIS — M79674 Pain in right toe(s): Secondary | ICD-10-CM

## 2022-05-22 DIAGNOSIS — B351 Tinea unguium: Secondary | ICD-10-CM

## 2022-05-22 DIAGNOSIS — L97412 Non-pressure chronic ulcer of right heel and midfoot with fat layer exposed: Secondary | ICD-10-CM

## 2022-05-25 NOTE — Progress Notes (Signed)
   Chief Complaint  Patient presents with   Wound Check    Patient is here for follow-up right heel wound.    SUBJECTIVE Patient presents to office today complaining of elongated, thickened nails that cause pain while ambulating in shoes.  Patient is unable to trim their own nails.  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  She has an appointment coming up with the wound care center on 05/26/2022.  She is not presenting today necessarily to have the heel wound evaluated and treated and just requesting routine nail care since she already has an appointment set up with the [facility].  Patient is here for further evaluation and treatment.  Past Medical History:  Diagnosis Date   Anxiety    Asthma    COPD (chronic obstructive pulmonary disease) ([place])    Depression    Difficult intravenous access    GERD (gastroesophageal reflux disease)    History of chickenpox    Seizures ([place])    Smoker    Stroke ([place])     No Known Allergies     OBJECTIVE General Patient is awake, alert, and oriented x 3 and in no acute distress. Derm chronic ulcer noted right posterior heel.  Appears stable with minimal serosanguineous drainage.  Granular wound base.  No exposed bone muscle tendon ligament or joint.  She has an appointment coming up 05/26/2022 at the [facility] wound care center  nails are tender, long, thickened and dystrophic with subungual debris, consistent with onychomycosis, 1-5 bilateral. No signs of infection noted. Vasc  DP and PT pedal pulses palpable bilaterally. Temperature gradient within normal limits.  Neuro Epicritic and protective threshold sensation grossly intact bilaterally.  Musculoskeletal Exam No symptomatic pedal deformities noted bilateral. Muscular strength within normal limits.  ASSESSMENT 1.  Pain due to onychomycosis of toenails both 2.  Ulcer right posterior heel fat layer exposed  PLAN OF CARE 1. Patient evaluated today.  2. Instructed to  maintain good pedal hygiene and foot care.  3. Mechanical debridement of nails 1-5 bilaterally performed using a nail nipper. Filed with dremel without incident.  4.  Dressings were reapplied to the right posterior heel.   5.  Return to clinic in 3 mos.    Edrick Kins, DPM [facility]  Dr. Edrick Kins, DPM    [address], [place] [address]                Office [phone]  Fax [phone]

## 2022-05-26 ENCOUNTER — Ambulatory Visit: Payer: Medicare Other | Admitting: Physician Assistant

## 2022-05-26 ENCOUNTER — Ambulatory Visit: Payer: Medicare Other | Admitting: Surgical

## 2022-05-31 ENCOUNTER — Inpatient Hospital Stay
Admission: EM | Admit: 2022-05-31 | Discharge: 2022-06-03 | DRG: 101 | Disposition: A | Discharge status: Home / Self Care | Payer: Medicare Other | Attending: Internal Medicine | Admitting: Internal Medicine

## 2022-05-31 ENCOUNTER — Other Ambulatory Visit: Payer: Self-pay

## 2022-05-31 ENCOUNTER — Emergency Department: Payer: Medicare Other

## 2022-05-31 DIAGNOSIS — F32A Depression, unspecified: Secondary | ICD-10-CM | POA: Diagnosis present

## 2022-05-31 DIAGNOSIS — N390 Urinary tract infection, site not specified: Secondary | ICD-10-CM

## 2022-05-31 DIAGNOSIS — F17210 Nicotine dependence, cigarettes, uncomplicated: Secondary | ICD-10-CM | POA: Diagnosis present

## 2022-05-31 DIAGNOSIS — G9349 Other encephalopathy: Secondary | ICD-10-CM | POA: Diagnosis present

## 2022-05-31 DIAGNOSIS — G40909 Epilepsy, unspecified, not intractable, without status epilepticus: Secondary | ICD-10-CM | POA: Diagnosis not present

## 2022-05-31 DIAGNOSIS — R569 Unspecified convulsions: Principal | ICD-10-CM

## 2022-05-31 DIAGNOSIS — Z818 Family history of other mental and behavioral disorders: Secondary | ICD-10-CM

## 2022-05-31 DIAGNOSIS — J4489 Other specified chronic obstructive pulmonary disease: Secondary | ICD-10-CM | POA: Diagnosis present

## 2022-05-31 DIAGNOSIS — F1010 Alcohol abuse, uncomplicated: Secondary | ICD-10-CM | POA: Diagnosis present

## 2022-05-31 DIAGNOSIS — Z9049 Acquired absence of other specified parts of digestive tract: Secondary | ICD-10-CM

## 2022-05-31 DIAGNOSIS — B9620 Unspecified Escherichia coli [E. coli] as the cause of diseases classified elsewhere: Secondary | ICD-10-CM | POA: Diagnosis present

## 2022-05-31 DIAGNOSIS — Z8249 Family history of ischemic heart disease and other diseases of the circulatory system: Secondary | ICD-10-CM

## 2022-05-31 DIAGNOSIS — J449 Chronic obstructive pulmonary disease, unspecified: Secondary | ICD-10-CM | POA: Diagnosis present

## 2022-05-31 DIAGNOSIS — Z833 Family history of diabetes mellitus: Secondary | ICD-10-CM

## 2022-05-31 DIAGNOSIS — R9389 Abnormal findings on diagnostic imaging of other specified body structures: Secondary | ICD-10-CM

## 2022-05-31 DIAGNOSIS — Z7951 Long term (current) use of inhaled steroids: Secondary | ICD-10-CM

## 2022-05-31 DIAGNOSIS — Z811 Family history of alcohol abuse and dependence: Secondary | ICD-10-CM

## 2022-05-31 DIAGNOSIS — G9341 Metabolic encephalopathy: Secondary | ICD-10-CM

## 2022-05-31 DIAGNOSIS — F1910 Other psychoactive substance abuse, uncomplicated: Secondary | ICD-10-CM | POA: Diagnosis present

## 2022-05-31 DIAGNOSIS — Z79891 Long term (current) use of opiate analgesic: Secondary | ICD-10-CM

## 2022-05-31 DIAGNOSIS — Z79899 Other long term (current) drug therapy: Secondary | ICD-10-CM

## 2022-05-31 DIAGNOSIS — R918 Other nonspecific abnormal finding of lung field: Secondary | ICD-10-CM | POA: Diagnosis present

## 2022-05-31 DIAGNOSIS — F1090 Alcohol use, unspecified, uncomplicated: Secondary | ICD-10-CM | POA: Diagnosis present

## 2022-05-31 DIAGNOSIS — Z825 Family history of asthma and other chronic lower respiratory diseases: Secondary | ICD-10-CM

## 2022-05-31 DIAGNOSIS — G40919 Epilepsy, unspecified, intractable, without status epilepticus: Secondary | ICD-10-CM | POA: Diagnosis present

## 2022-05-31 DIAGNOSIS — Z813 Family history of other psychoactive substance abuse and dependence: Secondary | ICD-10-CM

## 2022-05-31 DIAGNOSIS — R4182 Altered mental status, unspecified: Secondary | ICD-10-CM

## 2022-05-31 DIAGNOSIS — Z1152 Encounter for screening for COVID-19: Secondary | ICD-10-CM

## 2022-05-31 DIAGNOSIS — J441 Chronic obstructive pulmonary disease with (acute) exacerbation: Secondary | ICD-10-CM | POA: Diagnosis present

## 2022-05-31 DIAGNOSIS — Z8673 Personal history of transient ischemic attack (TIA), and cerebral infarction without residual deficits: Secondary | ICD-10-CM

## 2022-05-31 DIAGNOSIS — F1490 Cocaine use, unspecified, uncomplicated: Secondary | ICD-10-CM

## 2022-05-31 LAB — BASIC METABOLIC PANEL
Anion gap: 9 (ref 5–15)
BUN: 14 mg/dL (ref 6–20)
CO2: 26 mmol/L (ref 22–32)
Calcium: 8.8 mg/dL — ABNORMAL LOW (ref 8.9–10.3)
Chloride: 101 mmol/L (ref 98–111)
Creatinine, Ser: 0.68 mg/dL (ref 0.44–1.00)
GFR, Estimated: >60 mL/min (ref >60)
Glucose, Bld: 95 mg/dL (ref 70–99)
Potassium: 3.8 mmol/L (ref 3.5–5.1)
Sodium: 136 mmol/L (ref 135–145)

## 2022-05-31 LAB — CBC
HCT: 36.5 % (ref 36.0–46.0)
Hemoglobin: 12 g/dL (ref 12.0–15.0)
MCH: 28.5 pg (ref 26.0–34.0)
MCHC: 32.9 g/dL (ref 30.0–36.0)
MCV: 86.7 fL (ref 80.0–100.0)
Platelets: 329 10*3/uL (ref 150–400)
RBC: 4.21 MIL/uL (ref 3.87–5.11)
RDW: 13.2 % (ref 11.5–15.5)
WBC: 9.2 10*3/uL (ref 4.0–10.5)
nRBC: 0 % (ref 0.0–0.2)

## 2022-05-31 LAB — CBG MONITORING, ED: Glucose-Capillary: 95 mg/dL (ref 70–99)

## 2022-05-31 LAB — RESP PANEL BY RT-PCR (FLU A&B, COVID) ARPGX2
Influenza A by PCR: NEGATIVE
Influenza B by PCR: NEGATIVE
SARS Coronavirus 2 by RT PCR: NEGATIVE

## 2022-05-31 LAB — BRAIN NATRIURETIC PEPTIDE: B Natriuretic Peptide: 118.9 pg/mL — ABNORMAL HIGH (ref 0.0–100.0)

## 2022-05-31 MED ORDER — LEVETIRACETAM IN NACL 1000 MG/100ML IV SOLN
1000.0000 mg | Freq: Once | INTRAVENOUS | Status: AC
  Administered 2022-05-31: 1000 mg via INTRAVENOUS
  Filled 2022-05-31: qty 100

## 2022-05-31 NOTE — Discharge Instructions (Addendum)
Takle your medications as prescribed.  Follow up with PCP and neurology.  Return for any additional questions or concerns.

## 2022-05-31 NOTE — ED Triage Notes (Signed)
Pt arrives via EMS from her adult care facility ([facility]) for a seizure- pt has a hx of seizures- pt is still confused but unknown what her baseline is- pt smells strongly of urine- pt had 1 episode of vomiting upon arriving to room

## 2022-05-31 NOTE — Progress Notes (Signed)
A consult was placed to the hospital's IV Nurse to obtain iv access for the pt;  both arms assessed w ultrasound; no suitable veins noted;  RN aware;
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
## 2022-05-31 NOTE — ED Notes (Signed)
Pt taken for CT 

## 2022-05-31 NOTE — ED Provider Notes (Signed)
[facility] Provider Note    Event Date/Time   First MD Initiated Contact with Patient 05/31/22 1703     (approximate)   History   Seizures   HPI  Molly Dennis is a 51 y.o. female with a history of seizures on Keppra presents to the ER for evaluation of witnessed seizure-like activity.  She denies any pain or discomfort at this time.  Is amnestic to the event.  Denies any numbness or tingling.  No chest pain or shortness of breath.  Had 1 episode of vomiting upon arrival to the ER.  Denies any nausea at this time.     Physical Exam   Triage Vital Signs: ED Triage Vitals [05/31/22 1659]  Enc Vitals Group     BP      Pulse      Resp      Temp      Temp src      SpO2      Weight 180 lb (81.6 kg)     Height 5\' 5"  (1.651 m)     Head Circumference      Peak Flow      Pain Score      Pain Loc      Pain Edu?      Excl. in GC?     Most recent vital signs: Vitals:   05/31/22 2100 05/31/22 2130  BP: (!) 141/92 (!) 134/122  Pulse: 88 85  Resp: 19 19  Temp:    SpO2: 99% 95%     Constitutional: Alert  Eyes: Conjunctivae are normal.  Head: Atraumatic. Nose: No congestion/rhinnorhea. Mouth/Throat: Mucous membranes are moist.   Neck: Painless ROM.  Cardiovascular:   Good peripheral circulation. Respiratory: Normal respiratory effort.  No retractions.  Gastrointestinal: Soft and nontender.  Musculoskeletal:  no deformity Neurologic:  MAE spontaneously. No gross focal neurologic deficits are appreciated.  Skin:  Skin is warm, dry and intact. No rash noted. Psychiatric: Mood and affect are normal. Speech and behavior are normal.    ED Results / Procedures / Treatments   Labs (all labs ordered are listed, but only abnormal results are displayed) Labs Reviewed  BASIC METABOLIC PANEL - Abnormal; Notable for the following components:      Result Value   Calcium 8.8 (*)    All other components within normal limits  BRAIN NATRIURETIC  PEPTIDE - Abnormal; Notable for the following components:   B Natriuretic Peptide 118.9 (*)    All other components within normal limits  RESP PANEL BY RT-PCR (FLU A&B, COVID) ARPGX2  CBC  CBG MONITORING, ED     EKG  ED ECG REPORT I, 06/02/22, the attending physician, personally viewed and interpreted this ECG.   Date: 05/31/2022  EKG Time: 17:07  Rate: 85  Rhythm: sinus  Axis: normal  Intervals: borderline prolonged qt  ST&T Change: no stemi, nodepressions    RADIOLOGY Please see ED Course for my review and interpretation.  I personally reviewed all radiographic images ordered to evaluate for the above acute complaints and reviewed radiology reports and findings.  These findings were personally discussed with the patient.  Please see medical record for radiology report.    PROCEDURES:  Critical Care performed: no Procedures   MEDICATIONS ORDERED IN ED: Medications  levETIRAcetam (KEPPRA) IVPB 1000 mg/100 mL premix (0 mg Intravenous Stopped 05/31/22 2141)     IMPRESSION / MDM / ASSESSMENT AND PLAN / ED COURSE  I reviewed the triage vital signs  and the nursing notes.                              Differential diagnosis includes, but is not limited to, seizure, noncompliance, electrolyte abnormality, hypoglycemia, sepsis, SDH, IPH, CVA  Patient presenting to the ER for evaluation of symptoms as described above.  Based on symptoms, risk factors and considered above differential, this presenting complaint could reflect a potentially life-threatening illness therefore the patient will be placed on continuous pulse oximetry and telemetry for monitoring.  Laboratory evaluation will be sent to evaluate for the above complaints.  CT imaging will be ordered for by differential.  She is currently protecting her airway seems like this is likely a breakthrough seizure not consistent with status.   Clinical Course as of 05/31/22 0923  Nancy Fetter May 31, 2022  1802 CT head on  my review and interpretation shows evidence of previous CVA but no signs of hemorrhage. [PR]  2217 Patient able to ambulate about the room to baseline.  She is not having frequent cough she admits chest x-ray was ordered and on my review to rotation does not show any evidence of consolidation or pneumothorax per radiology report concerning for possible atypical pneumonia.  We will send COVID studies.  I have a lower suspicion for CHF. [PR]  2345 Reassessed.  She is not hypoxic.  Viral panel negative.  BNP only borderline elevated.  No lower extremity swelling.  She denies any chest pain or pressure.  She is not complaining of any shortness of breath.  At this point I do believe she stable and appropriate for outpatient follow-up.  She been observed here in the ER for several hours without any altered mental status or recurrent seizures.  Discussed strict return precautions. [PR]    Clinical Course User Index [PR] Merlyn Lot, MD     FINAL CLINICAL IMPRESSION(S) / ED DIAGNOSES   Final diagnoses:  Seizure-like activity ([place])     Rx / DC Orders   ED Discharge Orders     None        Note:  This document was prepared using Dragon voice recognition software and may include unintentional dictation errors.    Merlyn Lot, MD 05/31/22 [phone]

## 2022-06-01 ENCOUNTER — Encounter: Payer: Self-pay | Admitting: Internal Medicine

## 2022-06-01 ENCOUNTER — Inpatient Hospital Stay: Payer: Medicare Other

## 2022-06-01 DIAGNOSIS — B9620 Unspecified Escherichia coli [E. coli] as the cause of diseases classified elsewhere: Secondary | ICD-10-CM | POA: Diagnosis present

## 2022-06-01 DIAGNOSIS — F17210 Nicotine dependence, cigarettes, uncomplicated: Secondary | ICD-10-CM | POA: Diagnosis present

## 2022-06-01 DIAGNOSIS — J441 Chronic obstructive pulmonary disease with (acute) exacerbation: Secondary | ICD-10-CM | POA: Diagnosis present

## 2022-06-01 DIAGNOSIS — R918 Other nonspecific abnormal finding of lung field: Secondary | ICD-10-CM | POA: Diagnosis present

## 2022-06-01 DIAGNOSIS — G40909 Epilepsy, unspecified, not intractable, without status epilepticus: Secondary | ICD-10-CM | POA: Diagnosis present

## 2022-06-01 DIAGNOSIS — N3000 Acute cystitis without hematuria: Secondary | ICD-10-CM | POA: Diagnosis not present

## 2022-06-01 DIAGNOSIS — J449 Chronic obstructive pulmonary disease, unspecified: Secondary | ICD-10-CM | POA: Diagnosis present

## 2022-06-01 DIAGNOSIS — F32A Depression, unspecified: Secondary | ICD-10-CM | POA: Diagnosis present

## 2022-06-01 DIAGNOSIS — G9341 Metabolic encephalopathy: Secondary | ICD-10-CM

## 2022-06-01 DIAGNOSIS — G40919 Epilepsy, unspecified, intractable, without status epilepticus: Secondary | ICD-10-CM | POA: Diagnosis not present

## 2022-06-01 DIAGNOSIS — Z833 Family history of diabetes mellitus: Secondary | ICD-10-CM | POA: Diagnosis not present

## 2022-06-01 DIAGNOSIS — Z818 Family history of other mental and behavioral disorders: Secondary | ICD-10-CM | POA: Diagnosis not present

## 2022-06-01 DIAGNOSIS — R569 Unspecified convulsions: Secondary | ICD-10-CM | POA: Insufficient documentation

## 2022-06-01 DIAGNOSIS — R9389 Abnormal findings on diagnostic imaging of other specified body structures: Secondary | ICD-10-CM

## 2022-06-01 DIAGNOSIS — F1910 Other psychoactive substance abuse, uncomplicated: Secondary | ICD-10-CM | POA: Diagnosis present

## 2022-06-01 DIAGNOSIS — N390 Urinary tract infection, site not specified: Secondary | ICD-10-CM | POA: Diagnosis present

## 2022-06-01 DIAGNOSIS — F1090 Alcohol use, unspecified, uncomplicated: Secondary | ICD-10-CM | POA: Diagnosis present

## 2022-06-01 DIAGNOSIS — G4089 Other seizures: Secondary | ICD-10-CM | POA: Diagnosis not present

## 2022-06-01 DIAGNOSIS — R4182 Altered mental status, unspecified: Secondary | ICD-10-CM

## 2022-06-01 DIAGNOSIS — Z79899 Other long term (current) drug therapy: Secondary | ICD-10-CM | POA: Diagnosis not present

## 2022-06-01 DIAGNOSIS — F1490 Cocaine use, unspecified, uncomplicated: Secondary | ICD-10-CM

## 2022-06-01 DIAGNOSIS — Z813 Family history of other psychoactive substance abuse and dependence: Secondary | ICD-10-CM | POA: Diagnosis not present

## 2022-06-01 DIAGNOSIS — Z79891 Long term (current) use of opiate analgesic: Secondary | ICD-10-CM | POA: Diagnosis not present

## 2022-06-01 DIAGNOSIS — F1010 Alcohol abuse, uncomplicated: Secondary | ICD-10-CM | POA: Diagnosis present

## 2022-06-01 DIAGNOSIS — Z811 Family history of alcohol abuse and dependence: Secondary | ICD-10-CM | POA: Diagnosis not present

## 2022-06-01 DIAGNOSIS — G9349 Other encephalopathy: Secondary | ICD-10-CM | POA: Diagnosis present

## 2022-06-01 DIAGNOSIS — Z8673 Personal history of transient ischemic attack (TIA), and cerebral infarction without residual deficits: Secondary | ICD-10-CM | POA: Diagnosis not present

## 2022-06-01 DIAGNOSIS — Z8249 Family history of ischemic heart disease and other diseases of the circulatory system: Secondary | ICD-10-CM | POA: Diagnosis not present

## 2022-06-01 DIAGNOSIS — Z1152 Encounter for screening for COVID-19: Secondary | ICD-10-CM | POA: Diagnosis not present

## 2022-06-01 DIAGNOSIS — Z825 Family history of asthma and other chronic lower respiratory diseases: Secondary | ICD-10-CM | POA: Diagnosis not present

## 2022-06-01 DIAGNOSIS — Z7951 Long term (current) use of inhaled steroids: Secondary | ICD-10-CM | POA: Diagnosis not present

## 2022-06-01 DIAGNOSIS — Z9049 Acquired absence of other specified parts of digestive tract: Secondary | ICD-10-CM | POA: Diagnosis not present

## 2022-06-01 DIAGNOSIS — J4489 Other specified chronic obstructive pulmonary disease: Secondary | ICD-10-CM | POA: Diagnosis present

## 2022-06-01 LAB — URINE DRUG SCREEN, QUALITATIVE (ARMC ONLY)
Amphetamines, Ur Screen: NOT DETECTED
Barbiturates, Ur Screen: NOT DETECTED
Benzodiazepine, Ur Scrn: NOT DETECTED
Cannabinoid 50 Ng, Ur ~~LOC~~: NOT DETECTED
Cocaine Metabolite,Ur ~~LOC~~: POSITIVE — AB
MDMA (Ecstasy)Ur Screen: NOT DETECTED
Methadone Scn, Ur: NOT DETECTED
Opiate, Ur Screen: NOT DETECTED
Phencyclidine (PCP) Ur S: NOT DETECTED
Tricyclic, Ur Screen: POSITIVE — AB

## 2022-06-01 LAB — URINALYSIS, ROUTINE W REFLEX MICROSCOPIC
Bilirubin Urine: NEGATIVE
Glucose, UA: NEGATIVE mg/dL
Hgb urine dipstick: NEGATIVE
Ketones, ur: NEGATIVE mg/dL
Leukocytes,Ua: NEGATIVE
Nitrite: POSITIVE — AB
Protein, ur: NEGATIVE mg/dL
Specific Gravity, Urine: 1.015 (ref 1.005–1.030)
pH: 7 (ref 5.0–8.0)

## 2022-06-01 LAB — AMMONIA: Ammonia: 10 umol/L (ref 9–35)

## 2022-06-01 LAB — CBC
HCT: 37.1 % (ref 36.0–46.0)
Hemoglobin: 12.2 g/dL (ref 12.0–15.0)
MCH: 29.1 pg (ref 26.0–34.0)
MCHC: 32.9 g/dL (ref 30.0–36.0)
MCV: 88.5 fL (ref 80.0–100.0)
Platelets: 290 10*3/uL (ref 150–400)
RBC: 4.19 MIL/uL (ref 3.87–5.11)
RDW: 13.2 % (ref 11.5–15.5)
WBC: 7.2 10*3/uL (ref 4.0–10.5)
nRBC: 0 % (ref 0.0–0.2)

## 2022-06-01 LAB — CREATININE, SERUM
Creatinine, Ser: 0.77 mg/dL (ref 0.44–1.00)
GFR, Estimated: >60 mL/min (ref >60)

## 2022-06-01 LAB — HEPATIC FUNCTION PANEL
ALT: 12 U/L (ref 0–44)
AST: 20 U/L (ref 15–41)
Albumin: 2.9 g/dL — ABNORMAL LOW (ref 3.5–5.0)
Alkaline Phosphatase: 91 U/L (ref 38–126)
Bilirubin, Direct: <0.1 mg/dL (ref 0.0–0.2)
Total Bilirubin: 0.3 mg/dL (ref 0.3–1.2)
Total Protein: 7.2 g/dL (ref 6.5–8.1)

## 2022-06-01 LAB — ETHANOL: Alcohol, Ethyl (B): <10 mg/dL (ref <10)

## 2022-06-01 MED ORDER — ORAL CARE MOUTH RINSE
15.0000 mL | OROMUCOSAL | Status: DC
  Administered 2022-06-02 (×2): 15 mL via OROMUCOSAL
  Filled 2022-06-01 (×14): qty 15

## 2022-06-01 MED ORDER — LEVETIRACETAM 500 MG PO TABS
500.0000 mg | ORAL_TABLET | Freq: Two times a day (BID) | ORAL | Status: DC
  Administered 2022-06-02 – 2022-06-03 (×4): 500 mg via ORAL
  Filled 2022-06-01 (×4): qty 1

## 2022-06-01 MED ORDER — ATORVASTATIN CALCIUM 20 MG PO TABS
40.0000 mg | ORAL_TABLET | Freq: Every day | ORAL | Status: DC
  Administered 2022-06-02: 40 mg via ORAL
  Filled 2022-06-01: qty 2

## 2022-06-01 MED ORDER — LORAZEPAM 1 MG PO TABS
1.0000 mg | ORAL_TABLET | ORAL | Status: DC | PRN
  Administered 2022-06-01: 1 mg via ORAL
  Filled 2022-06-01: qty 1

## 2022-06-01 MED ORDER — SODIUM CHLORIDE 0.9 % IV SOLN
1.0000 g | Freq: Once | INTRAVENOUS | Status: AC
  Administered 2022-06-01: 1 g via INTRAVENOUS
  Filled 2022-06-01: qty 10

## 2022-06-01 MED ORDER — THIAMINE HCL 100 MG/ML IJ SOLN: 100.0000 mg | Freq: Every day | INTRAMUSCULAR | Status: DC

## 2022-06-01 MED ORDER — SODIUM CHLORIDE 0.9 % IV SOLN
75.0000 mL/h | INTRAVENOUS | Status: DC
  Administered 2022-06-01 – 2022-06-03 (×4): 75 mL/h via INTRAVENOUS

## 2022-06-01 MED ORDER — FOLIC ACID 1 MG PO TABS
1.0000 mg | ORAL_TABLET | Freq: Every day | ORAL | Status: DC
  Administered 2022-06-02 – 2022-06-03 (×2): 1 mg via ORAL
  Filled 2022-06-01 (×2): qty 1

## 2022-06-01 MED ORDER — ORAL CARE MOUTH RINSE: 15.0000 mL | OROMUCOSAL | Status: DC | PRN

## 2022-06-01 MED ORDER — ADULT MULTIVITAMIN W/MINERALS CH: 1.0000 | ORAL_TABLET | Freq: Every day | ORAL | Status: DC

## 2022-06-01 MED ORDER — ONDANSETRON HCL 4 MG PO TABS: 4.0000 mg | ORAL_TABLET | Freq: Four times a day (QID) | ORAL | Status: DC | PRN

## 2022-06-01 MED ORDER — ENOXAPARIN SODIUM 40 MG/0.4ML IJ SOSY
40.0000 mg | PREFILLED_SYRINGE | INTRAMUSCULAR | Status: DC
  Administered 2022-06-02 (×2): 40 mg via SUBCUTANEOUS
  Filled 2022-06-01 (×2): qty 0.4

## 2022-06-01 MED ORDER — DULOXETINE HCL 30 MG PO CPEP
60.0000 mg | ORAL_CAPSULE | Freq: Every day | ORAL | Status: DC
  Administered 2022-06-02 – 2022-06-03 (×2): 60 mg via ORAL
  Filled 2022-06-01 (×2): qty 2

## 2022-06-01 MED ORDER — GABAPENTIN 300 MG PO CAPS
300.0000 mg | ORAL_CAPSULE | Freq: Three times a day (TID) | ORAL | Status: DC
  Administered 2022-06-02 – 2022-06-03 (×5): 300 mg via ORAL
  Filled 2022-06-01 (×5): qty 1

## 2022-06-01 MED ORDER — LORAZEPAM 2 MG/ML IJ SOLN
1.0000 mg | INTRAMUSCULAR | Status: DC | PRN
  Administered 2022-06-01: 1 mg via INTRAVENOUS
  Administered 2022-06-02: 2 mg via INTRAVENOUS
  Filled 2022-06-01 (×2): qty 1

## 2022-06-01 MED ORDER — ONDANSETRON HCL 4 MG/2ML IJ SOLN: 4.0000 mg | Freq: Four times a day (QID) | INTRAMUSCULAR | Status: DC | PRN

## 2022-06-01 MED ORDER — ALBUTEROL SULFATE (2.5 MG/3ML) 0.083% IN NEBU: 3.0000 mL | INHALATION_SOLUTION | RESPIRATORY_TRACT | Status: DC | PRN

## 2022-06-01 MED ORDER — THIAMINE MONONITRATE 100 MG PO TABS
100.0000 mg | ORAL_TABLET | Freq: Every day | ORAL | Status: DC
  Administered 2022-06-02 – 2022-06-03 (×2): 100 mg via ORAL
  Filled 2022-06-01 (×2): qty 1

## 2022-06-01 MED ORDER — ADULT MULTIVITAMIN W/MINERALS CH
1.0000 | ORAL_TABLET | Freq: Every day | ORAL | Status: DC
  Administered 2022-06-02 – 2022-06-03 (×2): 1 via ORAL
  Filled 2022-06-01 (×2): qty 1

## 2022-06-01 MED ORDER — FOLIC ACID 1 MG PO TABS: 1.0000 mg | ORAL_TABLET | Freq: Every day | ORAL | Status: DC

## 2022-06-01 MED ORDER — LORAZEPAM 2 MG/ML IJ SOLN: 2.0000 mg | INTRAMUSCULAR | Status: DC | PRN

## 2022-06-01 MED ORDER — THIAMINE MONONITRATE 100 MG PO TABS: 100.0000 mg | ORAL_TABLET | Freq: Every day | ORAL | Status: DC

## 2022-06-01 NOTE — Assessment & Plan Note (Signed)
Loaded with Keppra in the ED Continue Keppra 500 mg twice daily and gabapentin 300 mg 3 times daily Ativan as needed seizure Seizure and fall precautions Follow-up Keppra level Can consider neurology consult

## 2022-06-01 NOTE — Assessment & Plan Note (Signed)
For counseling when back to baseline.  UDS positive for cocaine and tricyclic

## 2022-06-01 NOTE — ED Notes (Addendum)
Patient woke up, ate 90% of dinner tray and drank water.  Patient agreeable to taking night time meds at this time, will give per [facility].  Patient seizure pads in place, call bell within reach, yellow fall risk socks on patient at this time.

## 2022-06-01 NOTE — Assessment & Plan Note (Signed)
Chest x-ray showed "Diffuse bilateral interstitial pulmonary opacity, consistent with edema or atypical/viral infection. No focal airspace opacity" Negative COVID and flu No obvious respiratory symptoms

## 2022-06-01 NOTE — ED Notes (Signed)
Patient has not ate any of her meals this shift. Encouraged patient to sit up and eat. Patient refused. Hand tremors mild.

## 2022-06-01 NOTE — Final Progress Note (Signed)
Same day rounding progress note  Patient seen and examined while in the ED.  Waiting for the floor bed.  Please see Dr. Josefine Class dictated history and physical for further details  Breakthrough seizure Patient was postictal when I saw her Continue Keppra and maintain seizure precaution High risk for fall We will consult neurology in the morning  Time spent: 15 minutes

## 2022-06-01 NOTE — ED Notes (Signed)
Patient purewick out of place. Lined soiled with urine. Patient refuses to have linen changed and get washed. I explained to patient that I could not leave her bed soiled with urine. Patient irritable about being rolled. Continues to roll away when attempting to wash her. Ensured patient and linen  dry. Dry brief applied and purewick back in place.

## 2022-06-01 NOTE — ED Provider Notes (Signed)
-----------------------------------------   9:35 PM on 06/01/2022 ----------------------------------------- Patient attempted to ambulate on her own and unfortunately fell in her room, striking her head.  She did not lose consciousness, but complains of headache.  Plan to check CT head and cervical spine.  No extremity bony tenderness noted, no chest wall or abdominal tenderness noted.  ----------------------------------------- 10:49 PM on 06/01/2022 ----------------------------------------- CT imaging is negative for acute traumatic injury, does show evidence of possible pneumonia versus aspiration, patient currently being treated with antibiotics by hospitalist service.   Blake Divine, MD 06/01/22 2250

## 2022-06-01 NOTE — ED Notes (Signed)
Patient refusing to sit up and take medication. States, "Leave me alone, I just want to sleep". Patient lethargic. Will sit up and then lay right back down and pull her covers over her head and groan that she just wants to sleep.

## 2022-06-01 NOTE — ED Notes (Signed)
While this RN was in neighboring patient's room, patient was found on floor by staff in hall.  Patient alert, states she had to use the restroom.  Patient had removed yellow socks. Patient covered in feces.  MD Jessup immediately at bedside, CT orders placed.  Patient cleaned up and assisted to standing position where she proceeded to walk with a steady gait to bed. Patient immediately taken to CT.

## 2022-06-01 NOTE — ED Notes (Signed)
Lethargic. Arousable to touch. States "I just want to sleep" and pulls cover back over her head. Refuses to keep monitor leads or purewick in place.

## 2022-06-01 NOTE — Assessment & Plan Note (Signed)
Urine culture growing E. coli Continue Rocephin

## 2022-06-01 NOTE — ED Provider Notes (Signed)
12:55 AM  Assumed care at shift change.  Patient is a 51 year old female with history of COPD, bipolar disorder, seizures who presented to the emergency department after a seizure.  Patient seen by previous provider and up for discharge.  Per nursing staff patient is still confused but she is ambulatory.  It is unclear what her baseline normally is as there is no family at the bedside and reportedly she lives in a boardinghouse and we are not able to get in touch with anyone there.  Her lab work today was unremarkable other than minimally elevated BNP.  Her chest x-ray showed atypical/viral infection versus edema.  Her lungs are clear here and she has no hypoxia.  CT head showed old right MCA infarct but no acute abnormality.  On my exam, patient is able to tell me her name and what year it is but cannot tell me where she is at or what is going on.  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  She does have a history of polysubstance use and tells me that she did use crack today.  Denies any other drug or alcohol use.  We will add on additional blood work today to evaluate for other causes of altered mental status such as ammonia level, EtOH, urine drug screen, urinalysis.  It appears on previous ED and outpatient notes it appears that she has been acting normally.  It is unclear if she is intoxicated or still postictal.  Low suspicion for subclinical seizures at this time but if she continues to be altered she may need admission to the hospital.      RIGHT heel   Patient gave verbal permission to utilize photo for medical documentation only. The image was not stored on any personal device.    4:43 AM  Pt does have a nitrite positive urine.  She has bacteriuria and pyuria.  Culture pending.  Will give Rocephin here.  I wonder if this is on top of her substance abuse triggered her seizure today and if she is still  postictal.  Again I do not think she is having subclinical status epilepticus but I do not feel she is safe to be discharged if she is still confused.  She does not appear grossly intoxicated at this time but I feel she will need observation in the hospital.  Discussed this with Dr. Damita Dunnings with hospitalist service who agrees and will place admission orders.   Bryton Waight, Delice Bison, DO 06/01/22 [phone]

## 2022-06-01 NOTE — H&P (Signed)
History and Physical    Patient: Molly Dennis WUJ:[id number] DOB: 23-Oct-1970 DOA: 05/31/2022 DOS: the patient was seen and examined on 06/01/2022 PCP: Eden Emms, NP  Patient coming from: Home  Chief Complaint:  Chief Complaint  Patient presents with   Seizures    HPI: [name] is a 51 y.o. female with medical history significant for alcohol use as well as polysubstance abuse and seizure disorder on Keppra, who presented to the emergency room with a witnessed seizure.  Patient has no recollection of the episode.  She had no urinary incontinence.  Had an episode of vomiting upon arrival to the emergency room.  Patient has been confused since arrival to the ED about 12 hours prior per ED.  She is unable to contribute to history. ED course and data review: Afebrile, BP 154/94 and otherwise normal vitals.  Labs with normal CBC and BMP.  BNP 118, COVID and flu negative, urinalysis with positive nitrites, UDS with positive cocaine and tricyclics.  Ammonia level 10, EtOH pending. EKG, personally viewed and interpreted showing sinus rhythm at 90 with no acute ischemic changes. CT head showed the following: IMPRESSION: 1. No acute intracranial pathology. 2. Right MCA territory old infarct and encephalomalacia. 3. Age-related atrophy and chronic microvascular ischemic changes.  Chest x-ray showed the following: IMPRESSION: Diffuse bilateral interstitial pulmonary opacity, consistent with edema or atypical/viral infection. No focal airspace opacity  Patient was given a Keppra bolus and a dose of ceftriaxone for possible UTI.  After several hours of observation an attempt was made to discharge patient from the ED but she remained confused and was unsteady and unable to ambulate on her own.  Hospitalist consulted for admission.     Past Medical History:  Diagnosis Date   Anxiety    Asthma    COPD (chronic obstructive pulmonary disease) (HCC)    Depression    Difficult  intravenous access    GERD (gastroesophageal reflux disease)    History of chickenpox    Seizures (HCC)    Smoker    Stroke [facility])    Past Surgical History:  Procedure Laterality Date   CHOLECYSTECTOMY     DEBRIDEMENT AND CLOSURE WOUND Right 05/21/2021   Procedure: Debridement and excision of right heel wound;  Surgeon: Peggye Form, DO;  Location: [facility];  Service: Plastics;  Laterality: Right;   FOOT SURGERY     HARDWARE REMOVAL Right 07/23/2021   Procedure: HARDWARE REMOVAL RIGHT FOOT;  Surgeon: Roby Lofts, MD;  Location: [facility] OR;  Service: Orthopedics;  Laterality: Right;   I & D EXTREMITY Right 01/25/2021   Procedure: IRRIGATION AND DEBRIDEMENT RIGHT TIBIAL/FIBULA,, CALCANEUS;  Surgeon: Samson Frederic, MD;  Location: [facility] OR;  Service: Orthopedics;  Laterality: Right;   ORIF CALCANEOUS FRACTURE Right 01/27/2021   Procedure: OPEN REDUCTION INTERNAL FIXATION (ORIF) CALCANEOUS FRACTURE;  Surgeon: Roby Lofts, MD;  Location: [facility] OR;  Service: Orthopedics;  Laterality: Right;   ORIF TIBIA PLATEAU Right 01/27/2021   Procedure: OPEN REDUCTION INTERNAL FIXATION (ORIF) TIBIAL PLATEAU;  Surgeon: Roby Lofts, MD;  Location: [facility] OR;  Service: Orthopedics;  Laterality: Right;   SHOULDER SURGERY     TIBIA IM NAIL INSERTION Right 01/27/2021   Procedure: INTRAMEDULLARY (IM) NAIL TIBIAL;  Surgeon: Roby Lofts, MD;  Location: [facility] OR;  Service: Orthopedics;  Laterality: Right;   Social History:  reports that she has been smoking cigarettes. She has a 72.00 pack-year smoking history. She has never used smokeless tobacco.  She reports that she does not currently use alcohol. She reports current drug use. Drugs: Marijuana and Cocaine.  No Known Allergies  Family History  Problem Relation Age of Onset   Hypertension Mother    Heart disease Mother    Hearing loss Mother    COPD Mother    Asthma Mother    Arthritis Mother    Alcohol abuse Father    Drug abuse  Sister    Diabetes Sister    Depression Sister    Drug abuse Sister    Depression Sister    COPD Sister    Asthma Sister    Diabetes Maternal Grandfather    Diabetes Paternal Grandmother    Alcohol abuse Paternal Grandfather     Prior to Admission medications   Medication Sig Start Date End Date Taking? Authorizing Provider  albuterol (VENTOLIN HFA) 108 (90 Base) MCG/ACT inhaler Inhale 1-2 puffs into the lungs every 4 (four) hours as needed. 08/08/21   [provider]  amitriptyline (ELAVIL) 50 MG tablet Take 50 mg by mouth at bedtime. 08/29/21   [provider]  ARIPiprazole (ABILIFY) 10 MG tablet Take 10 mg by mouth daily. 08/29/21   [provider]  atomoxetine (STRATTERA) 60 MG capsule Take 60 mg by mouth every morning. 08/29/21   [provider]  atorvastatin (LIPITOR) 40 MG tablet TAKE 1 TABLET BY MOUTH DAILY 04/14/22   Eden Emms, NP  calcium carbonate (TUMS EX) 750 MG chewable tablet Chew 1 tablet by mouth every 4 (four) hours as needed for heartburn.    [provider]  diclofenac Sodium (VOLTAREN) 1 % GEL Apply 1 application topically 4 (four) times daily as needed (right knee pain).    [provider]  DULoxetine (CYMBALTA) 60 MG capsule Take 60 mg by mouth daily. 08/29/21   [provider]  gabapentin (NEURONTIN) 300 MG capsule TAKE 1 CAPSULE BY MOUTH 3 TIMES DAILY 04/14/22   Eden Emms, NP  levETIRAcetam (KEPPRA) 500 MG tablet Take 1 tablet (500 mg total) by mouth 2 (two) times daily. NO MORE REFILLS WITHOUT OFFICE VISIT 05/05/22   Eden Emms, NP  oxyCODONE (ROXICODONE) 5 MG immediate release tablet Take 1 tablet (5 mg total) by mouth every 6 (six) hours as needed for severe pain. 07/23/21   [place], PA-C  QUEtiapine (SEROQUEL) 400 MG tablet Take 400 mg by mouth 2 (two) times daily. 08/29/21   [provider]  traMADol (ULTRAM) 50 MG tablet Take 50 mg by mouth 3 (three) times daily as needed.  10/01/21   [provider]  WIXELA INHUB 100-50 MCG/ACT AEPB Inhale 1 puff into the lungs 2 (two) times daily. 08/08/21   [provider]    Physical Exam: Vitals:   05/31/22 2030 05/31/22 2100 05/31/22 2130 06/01/22 0401  BP: 137/88 (!) 141/92 (!) 134/122 120/67  Pulse: 91 88 85 74  Resp: 17 19 19 15   Temp:    98.1 F (36.7 C)  TempSrc:    Oral  SpO2: 94% 99% 95% 97%  Weight:      Height:       Physical Exam Vitals and nursing note reviewed.  Constitutional:      General: She is not in acute distress. HENT:     Head: Normocephalic and atraumatic.  Cardiovascular:     Rate and Rhythm: Normal rate and regular rhythm.     Heart sounds: Normal heart sounds.  Pulmonary:     Effort:  Pulmonary effort is normal.     Breath sounds: Normal breath sounds.  Abdominal:     Palpations: Abdomen is soft.     Tenderness: There is no abdominal tenderness.  Neurological:     Mental Status: She is disoriented.     Labs on Admission: I have personally reviewed following labs and imaging studies  CBC: Recent Labs  Lab 05/31/22 1939  WBC 9.2  HGB 12.0  HCT 36.5  MCV 86.7  PLT 329   Basic Metabolic Panel: Recent Labs  Lab 05/31/22 1939  NA 136  K 3.8  CL 101  CO2 26  GLUCOSE 95  BUN 14  CREATININE 0.68  CALCIUM 8.8*   GFR: Estimated Creatinine Clearance: 87.7 mL/min (by C-G formula based on SCr of 0.68 mg/dL). Liver Function Tests: Recent Labs  Lab 06/01/22 0149  AST 20  ALT 12  ALKPHOS 91  BILITOT 0.3  PROT 7.2  ALBUMIN 2.9*   No results for input(s): "LIPASE", "AMYLASE" in the last 168 hours. Recent Labs  Lab 06/01/22 0105  AMMONIA 10   Coagulation Profile: No results for input(s): "INR", "PROTIME" in the last 168 hours. Cardiac Enzymes: No results for input(s): "CKTOTAL", "CKMB", "CKMBINDEX", "TROPONINI" in the last 168 hours. BNP (last 3 results) No results for input(s): "PROBNP" in the last 8760 hours. HbA1C: No results for input(s):  "HGBA1C" in the last 72 hours. CBG: Recent Labs  Lab 05/31/22 1809  GLUCAP 95   Lipid Profile: No results for input(s): "CHOL", "HDL", "LDLCALC", "TRIG", "CHOLHDL", "LDLDIRECT" in the last 72 hours. Thyroid Function Tests: No results for input(s): "TSH", "T4TOTAL", "FREET4", "T3FREE", "THYROIDAB" in the last 72 hours. Anemia Panel: No results for input(s): "VITAMINB12", "FOLATE", "FERRITIN", "TIBC", "IRON", "RETICCTPCT" in the last 72 hours. Urine analysis:    Component Value Date/Time   COLORURINE YELLOW (A) 06/01/2022 0104   APPEARANCEUR HAZY (A) 06/01/2022 0104   LABSPEC 1.015 06/01/2022 0104   PHURINE 7.0 06/01/2022 0104   GLUCOSEU NEGATIVE 06/01/2022 0104   HGBUR NEGATIVE 06/01/2022 0104   BILIRUBINUR NEGATIVE 06/01/2022 0104   KETONESUR NEGATIVE 06/01/2022 0104   PROTEINUR NEGATIVE 06/01/2022 0104   NITRITE POSITIVE (A) 06/01/2022 0104   LEUKOCYTESUR NEGATIVE 06/01/2022 0104    Radiological Exams on Admission: DG Chest Portable 1 View  Result Date: 05/31/2022 CLINICAL DATA:  Cough EXAM: PORTABLE CHEST 1 VIEW COMPARISON:  03/06/2022 FINDINGS: The heart size and mediastinal contours are within normal limits. Diffuse bilateral interstitial pulmonary opacity. Plate and screw fixation of the proximal left humerus. IMPRESSION: Diffuse bilateral interstitial pulmonary opacity, consistent with edema or atypical/viral infection. No focal airspace opacity. Electronically Signed   By: Jearld Lesch M.D.   On: 05/31/2022 21:46   CT HEAD WO CONTRAST ( )  Result Date: 05/31/2022 CLINICAL DATA:  Seizure. EXAM: CT HEAD WITHOUT CONTRAST TECHNIQUE: Contiguous axial images were obtained from the base of the skull through the vertex without intravenous contrast. RADIATION DOSE REDUCTION: This exam was performed according to the departmental dose-optimization program which includes automated exposure control, adjustment of the mA and/or kV according to patient size and/or use of iterative  reconstruction technique. COMPARISON:  Head CT dated 03/06/2022. FINDINGS: Brain: Right MCA territory old infarct and encephalomalacia. There is age-related atrophy and chronic microvascular ischemic changes. There is no acute intracranial hemorrhage. No mass effect or midline shift. No extra-axial fluid collection. Vascular: No hyperdense vessel or unexpected calcification. Skull: Normal. Negative for fracture or focal lesion. Sinuses/Orbits: No acute finding. Other: None IMPRESSION: 1. No  acute intracranial pathology. 2. Right MCA territory old infarct and encephalomalacia. 3. Age-related atrophy and chronic microvascular ischemic changes. Electronically Signed   By: Anner Crete M.D.   On: 05/31/2022 18:09     Data Reviewed: Relevant notes from primary care and specialist visits, past discharge summaries as available in EHR, including Care Everywhere. Prior diagnostic testing as pertinent to current admission diagnoses Updated medications and problem lists for reconciliation ED course, including vitals, labs, imaging, treatment and response to treatment Triage notes, nursing and pharmacy notes and ED provider's notes Notable results as noted in HPI   Assessment and Plan: * Breakthrough seizure ([place]) Loaded with Keppra in the ED Continue Keppra 500 mg twice daily and gabapentin 300 mg 3 times daily Ativan as needed seizure Seizure and fall precautions Follow-up Keppra level Can consider neurology consult  Acute metabolic encephalopathy Possibly postictal from seizure in combination with substance abuse given UDS positive for cocaine and tricyclics Follow EtOH level for possibility of alcohol withdrawal seizure Neurologic checks Fall and aspiration precautions Hold sedating meds  Urinary tract infection Urinalysis with positive nitrites Continue Rocephin and follow cultures  Abnormal chest x-ray Chest x-ray showed "Diffuse bilateral interstitial pulmonary opacity, consistent  with edema or atypical/viral infection. No focal airspace opacity" Follow-up COVID and flu No obvious respiratory symptoms  Depression Continue Cymbalta We will hold amitriptyline until closer to baseline  COPD (chronic obstructive pulmonary disease) ([place]) Not acutely exacerbated DuoNebs as needed  Polysubstance abuse ([place]) For counseling when back to baseline  Alcohol use disorder Follow EtOH level CIWA withdrawal protocol  History of CVA (cerebrovascular accident) Continue atorvastatin        DVT prophylaxis: Lovenox  Consults: none  Advance Care Planning:   Code Status: Prior   Family Communication: none  Disposition Plan: Back to previous home environment  Severity of Illness: The appropriate patient status for this patient is OBSERVATION. Observation status is judged to be reasonable and necessary in order to provide the required intensity of service to ensure the patient's safety. The patient's presenting symptoms, physical exam findings, and initial radiographic and laboratory data in the context of their medical condition is felt to place them at decreased risk for further clinical deterioration. Furthermore, it is anticipated that the patient will be medically stable for discharge from the hospital within 2 midnights of admission.   Author: Athena Masse, MD 06/01/2022 4:46 AM  For on call review [email or web address].

## 2022-06-01 NOTE — Assessment & Plan Note (Signed)
Follow EtOH level CIWA withdrawal protocol

## 2022-06-01 NOTE — Assessment & Plan Note (Signed)
Not acutely exacerbated DuoNebs as needed 

## 2022-06-01 NOTE — Assessment & Plan Note (Signed)
Continue Cymbalta We will hold amitriptyline until closer to baseline

## 2022-06-01 NOTE — Assessment & Plan Note (Addendum)
Possibly postictal from seizure in combination with substance abuse given UDS positive for cocaine and tricyclics  possibility of alcohol withdrawal seizure pending neuro consult -Dr. Amada Jupiter was notified Neurologic checks Fall and aspiration precautions Hold sedating meds

## 2022-06-01 NOTE — Assessment & Plan Note (Signed)
Continue atorvastatin, neurology consult.  CT head negative for any acute intracranial pathology

## 2022-06-02 DIAGNOSIS — G9341 Metabolic encephalopathy: Secondary | ICD-10-CM

## 2022-06-02 DIAGNOSIS — F1910 Other psychoactive substance abuse, uncomplicated: Secondary | ICD-10-CM

## 2022-06-02 DIAGNOSIS — N3000 Acute cystitis without hematuria: Secondary | ICD-10-CM

## 2022-06-02 LAB — CBC
HCT: 41.6 % (ref 36.0–46.0)
Hemoglobin: 13.4 g/dL (ref 12.0–15.0)
MCH: 29.1 pg (ref 26.0–34.0)
MCHC: 32.2 g/dL (ref 30.0–36.0)
MCV: 90.2 fL (ref 80.0–100.0)
Platelets: 298 10*3/uL (ref 150–400)
RBC: 4.61 MIL/uL (ref 3.87–5.11)
RDW: 13.2 % (ref 11.5–15.5)
WBC: 5.6 10*3/uL (ref 4.0–10.5)
nRBC: 0 % (ref 0.0–0.2)

## 2022-06-02 LAB — BASIC METABOLIC PANEL
Anion gap: 12 (ref 5–15)
BUN: 12 mg/dL (ref 6–20)
CO2: 23 mmol/L (ref 22–32)
Calcium: 8.9 mg/dL (ref 8.9–10.3)
Chloride: 103 mmol/L (ref 98–111)
Creatinine, Ser: 0.75 mg/dL (ref 0.44–1.00)
GFR, Estimated: >60 mL/min (ref >60)
Glucose, Bld: 59 mg/dL — ABNORMAL LOW (ref 70–99)
Potassium: 3.5 mmol/L (ref 3.5–5.1)
Sodium: 138 mmol/L (ref 135–145)

## 2022-06-02 LAB — LEVETIRACETAM LEVEL: Levetiracetam Lvl: 18.1 ug/mL (ref 10.0–40.0)

## 2022-06-02 MED ORDER — SODIUM CHLORIDE 0.9% FLUSH
10.0000 mL | Freq: Two times a day (BID) | INTRAVENOUS | Status: DC
  Administered 2022-06-02: 10 mL

## 2022-06-02 MED ORDER — SODIUM CHLORIDE 0.9% FLUSH: 10.0000 mL | INTRAVENOUS | Status: DC | PRN

## 2022-06-02 NOTE — Progress Notes (Signed)

## 2022-06-02 NOTE — Hospital Course (Addendum)
51 year old female with no history of polysubstance abuse and seizure disorder on Keppra admitted for breakthrough seizure  10/31: Neurology consult and EEG pending

## 2022-06-02 NOTE — Progress Notes (Signed)
  Progress Note   Patient: Molly Dennis [id number]:[id number] DOB: 02/26/1971 DOA: 05/31/2022     1 DOS: the patient was seen and examined on 06/02/2022   Brief hospital course: 51 year old female with no history of polysubstance abuse and seizure disorder on Keppra admitted for breakthrough seizure  10/31: Neurology consult and EEG pending   Assessment and Plan: * Breakthrough seizure ([place]) Loaded with Keppra in the ED Continue Keppra 500 mg twice daily and gabapentin 300 mg 3 times daily Ativan as needed seizure Seizure and fall precautions EEG pending Pending neurology consult  Acute metabolic encephalopathy Possibly postictal from seizure in combination with substance abuse given UDS positive for cocaine and tricyclics  possibility of alcohol withdrawal seizure pending neuro consult -Dr. Leonel Ramsay was notified Neurologic checks Fall and aspiration precautions Hold sedating meds  Urinary tract infection Urine culture growing E. coli Continue Rocephin   Abnormal chest x-ray Chest x-ray showed "Diffuse bilateral interstitial pulmonary opacity, consistent with edema or atypical/viral infection. No focal airspace opacity" Negative COVID and flu No obvious respiratory symptoms  Depression Continue Cymbalta  hold amitriptyline until closer to baseline  COPD (chronic obstructive pulmonary disease) ([place]) Not acutely exacerbated DuoNebs as needed  Polysubstance abuse ([place]) For counseling when back to baseline.  UDS positive for cocaine and tricyclic  Alcohol use disorder Continue CIWA withdrawal protocol  History of CVA (cerebrovascular accident) Continue atorvastatin, neurology consult.  CT head negative for any acute intracranial pathology        Subjective: Patient is still obtunded, not wanting to talk  Physical Exam: Vitals:   06/02/22 1135 06/02/22 1617 06/02/22 1700 06/02/22 2030  BP: 130/87 137/86  (!) 134/113  Pulse: 89 (!) 102 94 92  Resp: 16 17 16  14   Temp: 98 F (36.7 C) 98.4 F (36.9 C)  (!) 97.3 F (36.3 C)  TempSrc: Axillary Axillary    SpO2: 91% 92%  100%  Weight:      Height:       51 year old female lying in the bed comfortably without any acute distress Lungs clear to auscultation bilaterally Heart regular rate and rhythm Abdomen soft and benign Neuro patient is lethargic and sleepy, nonfocal Data Reviewed:  Urine drug screen positive for cocaine and tricyclic  Family Communication: None  Disposition: Status is: Inpatient Remains inpatient appropriate because: Seizure management   Planned Discharge Destination: Home    DVT prophylaxis-Lovenox Time spent: 35 minutes  Author: Max Sane, MD 06/02/2022 10:06 PM  For on call review [email or web address].
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
## 2022-06-03 DIAGNOSIS — G4089 Other seizures: Secondary | ICD-10-CM

## 2022-06-03 LAB — BASIC METABOLIC PANEL
Anion gap: 7 (ref 5–15)
BUN: 9 mg/dL (ref 6–20)
CO2: 25 mmol/L (ref 22–32)
Calcium: 8.7 mg/dL — ABNORMAL LOW (ref 8.9–10.3)
Chloride: 107 mmol/L (ref 98–111)
Creatinine, Ser: 0.7 mg/dL (ref 0.44–1.00)
GFR, Estimated: >60 mL/min (ref >60)
Glucose, Bld: 89 mg/dL (ref 70–99)
Potassium: 3.5 mmol/L (ref 3.5–5.1)
Sodium: 139 mmol/L (ref 135–145)

## 2022-06-03 LAB — CBC
HCT: 35.3 % — ABNORMAL LOW (ref 36.0–46.0)
Hemoglobin: 12 g/dL (ref 12.0–15.0)
MCH: 29.3 pg (ref 26.0–34.0)
MCHC: 34 g/dL (ref 30.0–36.0)
MCV: 86.1 fL (ref 80.0–100.0)
Platelets: 304 10*3/uL (ref 150–400)
RBC: 4.1 MIL/uL (ref 3.87–5.11)
RDW: 13.1 % (ref 11.5–15.5)
WBC: 4.9 10*3/uL (ref 4.0–10.5)
nRBC: 0 % (ref 0.0–0.2)

## 2022-06-03 LAB — URINE CULTURE: Culture: >=100000 — AB

## 2022-06-03 MED ORDER — LEVETIRACETAM 500 MG PO TABS: 500.0000 mg | ORAL_TABLET | Freq: Two times a day (BID) | ORAL | 0 refills | Status: DC
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
## 2022-06-03 NOTE — Procedures (Signed)
History: 51 year old female with a history of seizures  Sedation: None  Technique: This EEG was acquired with electrodes placed according to the International 10-20 electrode system (including Fp1, Fp2, F3, F4, C3, C4, P3, P4, O1, O2, T3, T4, T5, T6, A1, A2, Fz, Cz, Pz). The following electrodes were missing or displaced: none.   Background: There is a posterior dominant rhythm of 8 Hz which attenuated with eye opening.  In addition, there is generalized irregular delta activity in the background even during maximal wakefulness.  With drowsiness, this increases.  Photic stimulation: Physiologic driving is present  EEG Abnormalities: Generalized irregular slow activity  Clinical Interpretation: This EEG is consistent with a generalized non-specific cerebral dysfunction(encephalopathy). There was no seizure or seizure predisposition recorded on this study. Please note that lack of epileptiform activity on EEG does not preclude the possibility of epilepsy.   Roland Rack, MD [facility] [phone]  If 7pm- 7am, please page neurology on call as listed in [place].

## 2022-06-03 NOTE — Discharge Summary (Signed)
Physician Discharge Summary  Molly Dennis NGE:[id number] DOB: 1971/06/07 DOA: 05/31/2022  PCP: Eden Emms, NP  Admit date: 05/31/2022 Discharge date: 06/03/2022  Admitted From: Group home Disposition:  Group home  Recommendations for Outpatient Follow-up:  Follow up with PCP in 1-2 weeks   Home Health:No Equipment/Devices:None   Discharge Condition:Stable  CODE STATUS:FULL  Diet recommendation: Reg  Brief/Interim Summary: 51 year old female with no history of polysubstance abuse and seizure disorder on Keppra admitted for breakthrough seizure   10/31: Neurology consult and EEG pending 11/1: Case discussed with neurology.  No indication for official consult.  EEG with generalized irregular slow activity.  Consistent with encephalopathy and nonspecific cerebral dysfunction rather than seizure disorder.  Suspected etiology of breakthrough seizures nonadherence to home Keppra regimen.  Also in the setting of substance use.    Discharge Diagnoses:  Principal Problem:   Breakthrough seizure (HCC) Active Problems:   Acute metabolic encephalopathy   Urinary tract infection   Abnormal chest x-ray   History of CVA (cerebrovascular accident)   Alcohol use disorder   Polysubstance abuse (HCC)   COPD (chronic obstructive pulmonary disease) (HCC)   Depression * Breakthrough seizure (HCC) Loaded with Keppra in the ED Suspected etiology nonadherence to prescribed twice daily Keppra.  No breakthrough seizures noted after admission.  EEG with generalized nonspecific cerebral dysfunction.  Resume home Keppra 500 mg p.o. twice daily.  Ensure compliance with medication regimen.   Acute metabolic encephalopathy Possibly postictal from seizure in combination with substance abuse given UDS positive for cocaine and tricyclics.  No evidence of alcohol withdrawal.  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  Mental status baseline at time of  discharge  Urinary tract infection Urine culture growing E. coli No indication resume antibiotics at time of discharge.  Received Rocephin in house   Abnormal chest x-ray Chest x-ray showed "Diffuse bilateral interstitial pulmonary opacity, consistent with edema or atypical/viral infection. No focal airspace opacity" Negative COVID and flu No obvious respiratory symptoms   Depression Continue Cymbalta Resume amitriptyline at time of discharge   COPD (chronic obstructive pulmonary disease) (HCC) Not acutely exacerbated DuoNebs as needed   Polysubstance abuse (HCC) Patient back to baseline.  Counseled on substance use sensation   Alcohol use disorder No evidence of acute withdrawal   History of CVA (cerebrovascular accident) Continue statin   Discharge Instructions  Discharge Instructions     Diet - low sodium heart healthy   Complete by: As directed    Increase activity slowly   Complete by: As directed    No wound care   Complete by: As directed       Allergies as of 06/03/2022   No Known Allergies      Medication List     STOP taking these medications    traMADol 50 MG tablet Commonly known as: ULTRAM       TAKE these medications    albuterol 108 (90 Base) MCG/ACT inhaler Commonly known as: VENTOLIN HFA Inhale 1-2 puffs into the lungs every 4 (four) hours as needed.   amitriptyline 50 MG tablet Commonly known as: ELAVIL Take 50 mg by mouth at bedtime.   ARIPiprazole 10 MG tablet Commonly known as: ABILIFY Take 10 mg by mouth daily.   atomoxetine 60 MG capsule Commonly known as: STRATTERA Take 60 mg by mouth every morning.   atorvastatin 40 MG tablet Commonly known as: LIPITOR TAKE 1 TABLET BY MOUTH DAILY   calcium carbonate 750 MG chewable tablet  Commonly known as: TUMS EX Chew 1 tablet by mouth every 4 (four) hours as needed for heartburn.   diclofenac Sodium 1 % Gel Commonly known as: VOLTAREN Apply 1 application topically 4  (four) times daily as needed (right knee pain).   DULoxetine 60 MG capsule Commonly known as: CYMBALTA Take 60 mg by mouth daily.   gabapentin 300 MG capsule Commonly known as: NEURONTIN TAKE 1 CAPSULE BY MOUTH 3 TIMES DAILY   levETIRAcetam 500 MG tablet Commonly known as: KEPPRA Take 1 tablet (500 mg total) by mouth 2 (two) times daily. NO MORE REFILLS WITHOUT OFFICE VISIT   oxyCODONE 5 MG immediate release tablet Commonly known as: Roxicodone Take 1 tablet (5 mg total) by mouth every 6 (six) hours as needed for severe pain.   QUEtiapine 400 MG tablet Commonly known as: SEROQUEL Take 400 mg by mouth 2 (two) times daily.   Wixela Inhub 100-50 MCG/ACT Aepb Generic drug: fluticasone-salmeterol Inhale 1 puff into the lungs 2 (two) times daily.        Follow-up Information     Schedule an appointment as soon as possible for a visit  with Eden Emmsable, James M, NP.   Specialties: Nurse Practitioner, Family Medicine Contact information: [address] [place] [id number] [phone]         [facility] EMERGENCY DEPARTMENT.   Specialty: Emergency Medicine Contact information: [id number] [place] [id number] [phone]               No Known Allergies  Consultations: None   Procedures/Studies: CT Head Wo Contrast  Result Date: 06/01/2022 CLINICAL DATA:  Trauma. EXAM: CT HEAD WITHOUT CONTRAST CT CERVICAL SPINE WITHOUT CONTRAST TECHNIQUE: Multidetector CT imaging of the head and cervical spine was performed following the standard protocol without intravenous contrast. Multiplanar CT image reconstructions of the cervical spine were also generated. RADIATION DOSE REDUCTION: This exam was performed according to the departmental dose-optimization program which includes automated exposure control, adjustment of the mA and/or kV according to patient size and/or use of iterative reconstruction technique.  COMPARISON:  Head CT dated 05/31/2022. FINDINGS: CT HEAD FINDINGS Brain: Age-related atrophy and chronic microvascular ischemic changes. Right MCA territory old infarct and encephalomalacia. There is no acute intracranial hemorrhage. No mass effect or midline shift. No extra-axial fluid collection. Vascular: No hyperdense vessel or unexpected calcification. Skull: Normal. Negative for fracture or focal lesion. Sinuses/Orbits: No acute finding. Other: None CT CERVICAL SPINE FINDINGS Alignment: No acute subluxation. Skull base and vertebrae: No acute fracture. Soft tissues and spinal canal: No prevertebral fluid or swelling. No visible canal hematoma. Disc levels:  No acute findings. Upper chest: Apparent scattered micronodularity in the upper lobes concerning for pneumonia or aspiration. Other: Old right scapula and left clavicular fractures. IMPRESSION: 1. No acute intracranial pathology. Age-related atrophy and chronic microvascular ischemic changes. Right MCA territory old infarct and encephalomalacia. 2. No acute/traumatic cervical spine pathology. 3. Apparent scattered micronodularity in the upper lobes concerning for pneumonia or aspiration. Electronically Signed   By: Elgie CollardArash  Radparvar M.D.   On: 06/01/2022 22:17   CT Cervical Spine Wo Contrast  Result Date: 06/01/2022 CLINICAL DATA:  Trauma. EXAM: CT HEAD WITHOUT CONTRAST CT CERVICAL SPINE WITHOUT CONTRAST TECHNIQUE: Multidetector CT imaging of the head and cervical spine was performed following the standard protocol without intravenous contrast. Multiplanar CT image reconstructions of the cervical spine were also generated. RADIATION DOSE REDUCTION: This exam was performed according to the departmental dose-optimization  program which includes automated exposure control, adjustment of the mA and/or kV according to patient size and/or use of iterative reconstruction technique. COMPARISON:  Head CT dated 05/31/2022. FINDINGS: CT HEAD FINDINGS Brain:  Age-related atrophy and chronic microvascular ischemic changes. Right MCA territory old infarct and encephalomalacia. There is no acute intracranial hemorrhage. No mass effect or midline shift. No extra-axial fluid collection. Vascular: No hyperdense vessel or unexpected calcification. Skull: Normal. Negative for fracture or focal lesion. Sinuses/Orbits: No acute finding. Other: None CT CERVICAL SPINE FINDINGS Alignment: No acute subluxation. Skull base and vertebrae: No acute fracture. Soft tissues and spinal canal: No prevertebral fluid or swelling. No visible canal hematoma. Disc levels:  No acute findings. Upper chest: Apparent scattered micronodularity in the upper lobes concerning for pneumonia or aspiration. Other: Old right scapula and left clavicular fractures. IMPRESSION: 1. No acute intracranial pathology. Age-related atrophy and chronic microvascular ischemic changes. Right MCA territory old infarct and encephalomalacia. 2. No acute/traumatic cervical spine pathology. 3. Apparent scattered micronodularity in the upper lobes concerning for pneumonia or aspiration. Electronically Signed   By: Elgie Collard M.D.   On: 06/01/2022 22:17   DG Chest Portable 1 View  Result Date: 05/31/2022 CLINICAL DATA:  Cough EXAM: PORTABLE CHEST 1 VIEW COMPARISON:  03/06/2022 FINDINGS: The heart size and mediastinal contours are within normal limits. Diffuse bilateral interstitial pulmonary opacity. Plate and screw fixation of the proximal left humerus. IMPRESSION: Diffuse bilateral interstitial pulmonary opacity, consistent with edema or atypical/viral infection. No focal airspace opacity. Electronically Signed   By: Jearld Lesch M.D.   On: 05/31/2022 21:46   CT HEAD WO CONTRAST ( )  Result Date: 05/31/2022 CLINICAL DATA:  Seizure. EXAM: CT HEAD WITHOUT CONTRAST TECHNIQUE: Contiguous axial images were obtained from the base of the skull through the vertex without intravenous contrast. RADIATION DOSE  REDUCTION: This exam was performed according to the departmental dose-optimization program which includes automated exposure control, adjustment of the mA and/or kV according to patient size and/or use of iterative reconstruction technique. COMPARISON:  Head CT dated 03/06/2022. FINDINGS: Brain: Right MCA territory old infarct and encephalomalacia. There is age-related atrophy and chronic microvascular ischemic changes. There is no acute intracranial hemorrhage. No mass effect or midline shift. No extra-axial fluid collection. Vascular: No hyperdense vessel or unexpected calcification. Skull: Normal. Negative for fracture or focal lesion. Sinuses/Orbits: No acute finding. Other: None IMPRESSION: 1. No acute intracranial pathology. 2. Right MCA territory old infarct and encephalomalacia. 3. Age-related atrophy and chronic microvascular ischemic changes. Electronically Signed   By: Elgie Collard M.D.   On: 05/31/2022 18:09      Subjective: Seen and examined on day of discharge.  Stable, no distress.  Slightly confused.  Per patient's mother this is her baseline level of mentation.  Appropriate for discharge.  Will return back to group home.  Discharge Exam: Vitals:   06/03/22 0531 06/03/22 0726  BP: (!) 145/82 (!) 144/92  Pulse: 87 87  Resp: 16 18  Temp: 97.7 F (36.5 C) 98.4 F (36.9 C)  SpO2: 95% 96%   Vitals:   06/02/22 2030 06/03/22 0030 06/03/22 0531 06/03/22 0726  BP: (!) 134/113 (!) 135/97 (!) 145/82 (!) 144/92  Pulse: 92 95 87 87  Resp: 14 16 16 18   Temp: (!) 97.3 F (36.3 C) 98.8 F (37.1 C) 97.7 F (36.5 C) 98.4 F (36.9 C)  TempSrc:  Oral    SpO2: 100% 99% 95% 96%  Weight:      Height:  General: Pt is alert, awake, not in acute distress Cardiovascular: RRR, S1/S2 +, no rubs, no gallops Respiratory: CTA bilaterally, no wheezing, no rhonchi Abdominal: Soft, NT, ND, bowel sounds + Extremities: no edema, no cyanosis    The results of significant diagnostics  from this hospitalization (including imaging, microbiology, ancillary and laboratory) are listed below for reference.     Microbiology: Recent Results (from the past 240 hour(s))  Resp Panel by RT-PCR (Flu A&B, Covid) Anterior Nasal Swab     Status: None   Collection Time: 05/31/22 10:03 PM   Specimen: Anterior Nasal Swab  Result Value Ref Range Status   SARS Coronavirus 2 by RT PCR NEGATIVE NEGATIVE Final    Comment: (NOTE) SARS-CoV-2 target nucleic acids are NOT DETECTED.  The SARS-CoV-2 RNA is generally detectable in upper respiratory specimens during the acute phase of infection. The lowest concentration of SARS-CoV-2 viral copies this assay can detect is 138 copies/mL. A negative result does not preclude SARS-Cov-2 infection and should not be used as the sole basis for treatment or other patient management decisions. A negative result may occur with  improper specimen collection/handling, submission of specimen other than nasopharyngeal swab, presence of viral mutation(s) within the areas targeted by this assay, and inadequate number of viral copies(<138 copies/mL). A negative result must be combined with clinical observations, patient history, and epidemiological information. The expected result is Negative.  Fact Sheet for Patients:  BloggerCourse.com  Fact Sheet for Healthcare Providers:  SeriousBroker.it  This test is no t yet approved or cleared by the [place] FDA and  has been authorized for detection and/or diagnosis of SARS-CoV-2 by FDA under an Emergency Use Authorization (EUA). This EUA will remain  in effect (meaning this test can be used) for the duration of the COVID-19 declaration under Section 564(b)(1) of the Act, 21 U.S.C.section 360bbb-3(b)(1), unless the authorization is terminated  or revoked sooner.       Influenza A by PCR NEGATIVE NEGATIVE Final   Influenza B by PCR NEGATIVE NEGATIVE Final     Comment: (NOTE) The Xpert Xpress SARS-CoV-2/FLU/RSV plus assay is intended as an aid in the diagnosis of influenza from Nasopharyngeal swab specimens and should not be used as a sole basis for treatment. Nasal washings and aspirates are unacceptable for Xpert Xpress SARS-CoV-2/FLU/RSV testing.  Fact Sheet for Patients: BloggerCourse.com  Fact Sheet for Healthcare Providers: SeriousBroker.it  This test is not yet approved or cleared by the [place] FDA and has been authorized for detection and/or diagnosis of SARS-CoV-2 by FDA under an Emergency Use Authorization (EUA). This EUA will remain in effect (meaning this test can be used) for the duration of the COVID-19 declaration under Section 564(b)(1) of the Act, 21 U.S.C. section 360bbb-3(b)(1), unless the authorization is terminated or revoked.  Performed at [facility], [address] [place], [place] [address]   Urine Culture     Status: Abnormal   Collection Time: 06/01/22  1:49 AM   Specimen: Urine, Clean Catch  Result Value Ref Range Status   Specimen Description   Final    URINE, CLEAN CATCH Performed at [facility], [address] [place], [place] [address]    Special Requests   Final    NONE Performed at [facility], [address] [place], [place] [address]    Culture >=100,000 COLONIES/mL ESCHERICHIA COLI (A)  Final   Report Status 06/03/2022 FINAL  Final   Organism ID, Bacteria ESCHERICHIA COLI (A)  Final  Susceptibility   Escherichia coli - MIC*    AMPICILLIN <=2 SENSITIVE Sensitive     CEFAZOLIN <=4 SENSITIVE Sensitive     CEFEPIME <=0.12 SENSITIVE Sensitive     CEFTRIAXONE <=0.25 SENSITIVE Sensitive     CIPROFLOXACIN >=4 RESISTANT Resistant     GENTAMICIN <=1 SENSITIVE Sensitive     IMIPENEM <=0.25 SENSITIVE Sensitive     NITROFURANTOIN <=16 SENSITIVE Sensitive     TRIMETH/SULFA >=320 RESISTANT Resistant      AMPICILLIN/SULBACTAM <=2 SENSITIVE Sensitive     PIP/TAZO <=4 SENSITIVE Sensitive     * >=100,000 COLONIES/mL ESCHERICHIA COLI     Labs: BNP (last 3 results) Recent Labs    05/31/22 1939  BNP 326.7*   Basic Metabolic Panel: Recent Labs  Lab 05/31/22 1939 06/01/22 0149 06/02/22 0714 06/03/22 0816  NA 136  --  138 139  K 3.8  --  3.5 3.5  CL 101  --  103 107  CO2 26  --  23 25  GLUCOSE 95  --  59* 89  BUN 14  --  12 9  CREATININE 0.68 0.77 0.75 0.70  CALCIUM 8.8*  --  8.9 8.7*   Liver Function Tests: Recent Labs  Lab 06/01/22 0149  AST 20  ALT 12  ALKPHOS 91  BILITOT 0.3  PROT 7.2  ALBUMIN 2.9*   No results for input(s): "LIPASE", "AMYLASE" in the last 168 hours. Recent Labs  Lab 06/01/22 0105  AMMONIA 10   CBC: Recent Labs  Lab 05/31/22 1939 06/01/22 0624 06/02/22 0714 06/03/22 0816  WBC 9.2 7.2 5.6 4.9  HGB 12.0 12.2 13.4 12.0  HCT 36.5 37.1 41.6 35.3*  MCV 86.7 88.5 90.2 86.1  PLT 329 290 298 304   Cardiac Enzymes: No results for input(s): "CKTOTAL", "CKMB", "CKMBINDEX", "TROPONINI" in the last 168 hours. BNP: Invalid input(s): "POCBNP" CBG: Recent Labs  Lab 05/31/22 1809  GLUCAP 95   D-Dimer No results for input(s): "DDIMER" in the last 72 hours. Hgb A1c No results for input(s): "HGBA1C" in the last 72 hours. Lipid Profile No results for input(s): "CHOL", "HDL", "LDLCALC", "TRIG", "CHOLHDL", "LDLDIRECT" in the last 72 hours. Thyroid function studies No results for input(s): "TSH", "T4TOTAL", "T3FREE", "THYROIDAB" in the last 72 hours.  Invalid input(s): "FREET3" Anemia work up No results for input(s): "VITAMINB12", "FOLATE", "FERRITIN", "TIBC", "IRON", "RETICCTPCT" in the last 72 hours. Urinalysis    Component Value Date/Time   COLORURINE YELLOW (A) 06/01/2022 0104   APPEARANCEUR HAZY (A) 06/01/2022 0104   LABSPEC 1.015 06/01/2022 0104   PHURINE 7.0 06/01/2022 0104   GLUCOSEU NEGATIVE 06/01/2022 0104   HGBUR NEGATIVE 06/01/2022  0104   BILIRUBINUR NEGATIVE 06/01/2022 0104   KETONESUR NEGATIVE 06/01/2022 0104   PROTEINUR NEGATIVE 06/01/2022 0104   NITRITE POSITIVE (A) 06/01/2022 0104   LEUKOCYTESUR NEGATIVE 06/01/2022 0104   Sepsis Labs Recent Labs  Lab 05/31/22 1939 06/01/22 0624 06/02/22 0714 06/03/22 0816  WBC 9.2 7.2 5.6 4.9   Microbiology Recent Results (from the past 240 hour(s))  Resp Panel by RT-PCR (Flu A&B, Covid) Anterior Nasal Swab     Status: None   Collection Time: 05/31/22 10:03 PM   Specimen: Anterior Nasal Swab  Result Value Ref Range Status   SARS Coronavirus 2 by RT PCR NEGATIVE NEGATIVE Final    Comment: (NOTE) SARS-CoV-2 target nucleic acids are NOT DETECTED.  The SARS-CoV-2 RNA is generally detectable in upper respiratory specimens during the acute phase of infection. The lowest concentration of SARS-CoV-2 viral  copies this assay can detect is 138 copies/mL. A negative result does not preclude SARS-Cov-2 infection and should not be used as the sole basis for treatment or other patient management decisions. A negative result may occur with  improper specimen collection/handling, submission of specimen other than nasopharyngeal swab, presence of viral mutation(s) within the areas targeted by this assay, and inadequate number of viral copies(<138 copies/mL). A negative result must be combined with clinical observations, patient history, and epidemiological information. The expected result is Negative.  Fact Sheet for Patients:  BloggerCourse.com  Fact Sheet for Healthcare Providers:  SeriousBroker.it  This test is no t yet approved or cleared by the [place] FDA and  has been authorized for detection and/or diagnosis of SARS-CoV-2 by FDA under an Emergency Use Authorization (EUA). This EUA will remain  in effect (meaning this test can be used) for the duration of the COVID-19 declaration under Section 564(b)(1) of the  Act, 21 U.S.C.section 360bbb-3(b)(1), unless the authorization is terminated  or revoked sooner.       Influenza A by PCR NEGATIVE NEGATIVE Final   Influenza B by PCR NEGATIVE NEGATIVE Final    Comment: (NOTE) The Xpert Xpress SARS-CoV-2/FLU/RSV plus assay is intended as an aid in the diagnosis of influenza from Nasopharyngeal swab specimens and should not be used as a sole basis for treatment. Nasal washings and aspirates are unacceptable for Xpert Xpress SARS-CoV-2/FLU/RSV testing.  Fact Sheet for Patients: BloggerCourse.com  Fact Sheet for Healthcare Providers: SeriousBroker.it  This test is not yet approved or cleared by the [place] FDA and has been authorized for detection and/or diagnosis of SARS-CoV-2 by FDA under an Emergency Use Authorization (EUA). This EUA will remain in effect (meaning this test can be used) for the duration of the COVID-19 declaration under Section 564(b)(1) of the Act, 21 U.S.C. section 360bbb-3(b)(1), unless the authorization is terminated or revoked.  Performed at [facility], [address] [place], [place] [address]   Urine Culture     Status: Abnormal   Collection Time: 06/01/22  1:49 AM   Specimen: Urine, Clean Catch  Result Value Ref Range Status   Specimen Description   Final    URINE, CLEAN CATCH Performed at [facility], [address] [place], [place] [address]    Special Requests   Final    NONE Performed at [facility], [address] [place], [place] [address]    Culture >=100,000 COLONIES/mL ESCHERICHIA COLI (A)  Final   Report Status 06/03/2022 FINAL  Final   Organism ID, Bacteria ESCHERICHIA COLI (A)  Final      Susceptibility   Escherichia coli - MIC*    AMPICILLIN <=2 SENSITIVE Sensitive     CEFAZOLIN <=4 SENSITIVE Sensitive     CEFEPIME <=0.12 SENSITIVE Sensitive     CEFTRIAXONE <=0.25 SENSITIVE Sensitive     CIPROFLOXACIN  >=4 RESISTANT Resistant     GENTAMICIN <=1 SENSITIVE Sensitive     IMIPENEM <=0.25 SENSITIVE Sensitive     NITROFURANTOIN <=16 SENSITIVE Sensitive     TRIMETH/SULFA >=320 RESISTANT Resistant     AMPICILLIN/SULBACTAM <=2 SENSITIVE Sensitive     PIP/TAZO <=4 SENSITIVE Sensitive     * >=100,000 COLONIES/mL ESCHERICHIA COLI     Time coordinating discharge: Over 30 minutes  SIGNED:   Tresa Moore, MD  [facility] 06/03/2022, 3:51 PM Pager   If 7PM-7AM, please contact night-coverage

## 2022-06-03 NOTE — Evaluation (Signed)
Occupational Therapy Evaluation Patient Details Name: Molly Dennis MRN: [id number] DOB: 1970/12/23 Today's Date: 06/03/2022   History of Present Illness Pt is a 51 y.o. female with medical history significant for alcohol use, polysubstance abuse, COPD, CVA, and seizure disorder on Keppra, who presented to the emergency room with a witnessed seizure.  MD assessment includes: Breakthrough seizure, acute metabolic encephalopathy, UTI, abnormal chest X-ray with "diffuse bilateral interstitial pulmonary opacity, consistent with edema or atypical/viral infection. No focal airspace opacity".   Clinical Impression   Molly Dennis was seen for OT evaluation this date. Prior to hospital admission, pt was MOD I for mobility and ADLs, reports hx of falls. Pt lives at boarding house. Pt presents to acute OT demonstrating impaired ADL performance and functional mobility 2/2 decreased activity tolerance. Pt oriented to self and time, states location as a school. Requires cues for sequencing ADLs - poor attention to tasks, likely baseline. Pt found with urinary incontinence with poor insight.   Pt currently requires SBA for toilet t/f no AD use, MIN A stand>sit at toilet to prevent head hitting on metal peice 2/2 poor eccentric control. Requires multiple attempts to rise from low toilet height 2/2 grab bar on L side (chronic weakness from CVA). Trialed x5 sit<>stand from standard chair height with no assist or LOBs. MOD cues for donning underwear, poor attenttion to task. Pt would benefit from skilled OT to address noted impairments and functional limitations (see below for any additional details). Upon hospital discharge, recommend no OT follow up.    Recommendations for follow up therapy are one component of a multi-disciplinary discharge planning process, led by the attending physician.  Recommendations may be updated based on patient status, additional functional criteria and insurance authorization.   Follow  Up Recommendations  Follow physician's recommendations for discharge plan and follow up therapies    Assistance Recommended at Discharge Intermittent Supervision/Assistance  Patient can return home with the following A little help with bathing/dressing/bathroom;Help with stairs or ramp for entrance;Direct supervision/assist for financial management;Direct supervision/assist for medications management    Functional Status Assessment  Patient has had a recent decline in their functional status and demonstrates the ability to make significant improvements in function in a reasonable and predictable amount of time.  Equipment Recommendations  None recommended by OT    Recommendations for Other Services       Precautions / Restrictions Precautions Precautions: Fall;Other (comment) (seizure) Precaution Comments: Seizure precautions Restrictions Weight Bearing Restrictions: No      Mobility Bed Mobility Overal bed mobility: Modified Independent                  Transfers Overall transfer level: Needs assistance Equipment used: None Transfers: Sit to/from Stand Sit to Stand: Supervision           General transfer comment: MIN A eccentric control on toilet, repeated multiple attempts from chair with no assist      Balance Overall balance assessment: Needs assistance   Sitting balance-Leahy Scale: Normal     Standing balance support: No upper extremity supported, During functional activity Standing balance-Leahy Scale: Fair                             ADL either performed or assessed with clinical judgement   ADL Overall ADL's : Needs assistance/impaired  General ADL Comments: SBA for toilet t/f - MIN A to prevent head hitting on metal behind toilet 2/2 poor eccentric control. requires multiple attempts to rise from low toilet height. SETUP don/doff gown. MOD cues for donning underwear, poor  attenttion to task.      Pertinent Vitals/Pain Pain Assessment Pain Assessment: No/denies pain     Hand Dominance Right   Extremity/Trunk Assessment Upper Extremity Assessment Upper Extremity Assessment: LUE deficits/detail LUE Deficits / Details: history of weakness from prior CVA   Lower Extremity Assessment Lower Extremity Assessment: Generalized weakness       Communication Communication Communication: No difficulties   Cognition Arousal/Alertness: Awake/alert Behavior During Therapy: WFL for tasks assessed/performed Overall Cognitive Status: No family/caregiver present to determine baseline cognitive functioning                                 General Comments: unable to asnwer all questions (garbled speech vs poor attention vs cog deficits)      Home Living Family/patient expects to be discharged to:: Group home                                 Additional Comments: Steps to enter with at least one rail (pt unsure of how many steps or which side the rail is on); very limited assistance available at boarding home; uses public transportation      Prior Functioning/Environment Prior Level of Function : Needs assist       Physical Assist : ADLs (physical)   ADLs (physical): Grooming Mobility Comments: per chart pt appears near baseline ADLs Comments: Mostly Ind with ADLs including bathing and dressing but requires assist with shaving under her arms        OT Problem List: Decreased strength;Decreased range of motion;Impaired balance (sitting and/or standing);Decreased activity tolerance      OT Treatment/Interventions: Therapeutic exercise;Self-care/ADL training;Energy conservation;DME and/or AE instruction;Therapeutic activities;Patient/family education;Balance training    OT Goals(Current goals can be found in the care plan section) Acute Rehab OT Goals Patient Stated Goal: to go home OT Goal Formulation: With patient Time For  Goal Achievement: 06/17/22 Potential to Achieve Goals: Good ADL Goals Pt Will Perform Grooming: standing;Independently Pt Will Perform Lower Body Dressing: Independently;sit to/from stand Pt Will Transfer to Toilet: Independently;ambulating;regular height toilet  OT Frequency: Min 2X/week    Co-evaluation              AM-PAC OT "6 Clicks" Daily Activity     Outcome Measure Help from another person eating meals?: None Help from another person taking care of personal grooming?: A Little Help from another person toileting, which includes using toliet, bedpan, or urinal?: A Little Help from another person bathing (including washing, rinsing, drying)?: A Little Help from another person to put on and taking off regular upper body clothing?: None Help from another person to put on and taking off regular lower body clothing?: A Little 6 Click Score: 20   End of Session Nurse Communication: Mobility status  Activity Tolerance: Patient tolerated treatment well Patient left: in bed;with call bell/phone within reach;with bed alarm set  OT Visit Diagnosis: Other abnormalities of gait and mobility (R26.89);Muscle weakness (generalized) (M62.81)                Time: 2542-7062 OT Time Calculation (min): 22 min Charges:  OT General Charges $OT Visit: 1 Visit  OT Evaluation $OT Eval Moderate Complexity: 1 Mod OT Treatments $Self Care/Home Management : 8-22 mins  Kathie Dike, M.S. OTR/L  06/03/22, 2:35 PM  ascom [phone]

## 2022-06-03 NOTE — Progress Notes (Signed)
     [facility] REFERRAL        Occupational Therapy * Physical Therapy * Speech Therapy                           DATE 06/03/22  PATIENT NAME  Molly Dennis  PATIENT MRN [id number]  DIAGNOSIS/DIAGNOSIS CODE G40.919  DATE OF DISCHARGE: 06/03/22       PRIMARY CARE PHYSICIAN     Karl Ito, NP   PCP PHONE/FAX [phone]     Dear Provider (Name: [facility] outpatient __  Fax: [phone]   I certify that I have examined this patient and that occupational/physical/speech therapy is necessary on an outpatient basis.    The patient has expressed interest in completing their recommended course of therapy at your  location.  Once a formal order from the patient's primary care physician has been obtained, please contact him/her to schedule an appointment for evaluation at your earliest convenience.   [ X]  Physical Therapy Evaluate and Treatment        The patient's primary care physician (listed above) must furnish and be responsible for a formal order such that the recommended services may be furnished while under the primary physician's care, and that the plan of care will be established and reviewed every 30 days (or more often if condition necessitates).  [place], [place]

## 2022-06-03 NOTE — Plan of Care (Signed)

## 2022-06-03 NOTE — Progress Notes (Signed)
PT Dc'd back to [facility] boarding house.  Attempted to educate re: DC instructions.  Pt verbalizes some understanding.  MD aware/CM aware that patient has some confusion at baseline.  Midline removed without incident.  Tele removed.  Report given to ambulance drivers.  Pr discharged without incident

## 2022-06-03 NOTE — TOC Progression Note (Signed)
Transition of Care [facility]) - Progression Note    Patient Details  Name: Molly Dennis MRN: [id number] Date of Birth: Feb 23, 1971  [facility] CM/SW [place], [place] Phone Number: 06/03/2022, 2:02 PM  Clinical Narrative:     Patient to dc home today to boarding house, patient has extensive hx of substance abuse active cocaine use. This poses barrier to home health accepting, however patient is set up with outpatient PT at [facility], referral faxed to [phone].   No further dc needs identified at this time.    Expected Discharge Plan:  (boarding house) Barriers to Discharge: Continued Medical Work up  Expected Discharge Plan and Services Expected Discharge Plan:  (boarding house)                                               Social Determinants of Health (SDOH) Interventions    Readmission Risk Interventions     No data to display

## 2022-06-04 ENCOUNTER — Telehealth: Payer: Self-pay

## 2022-06-04 NOTE — Telephone Encounter (Addendum)
Transition Care Management Unsuccessful Follow-up Telephone Call  Date of discharge and from where:  [place] 06-03-22 Dx: breakthrough seizure  Attempts:  1st Attempt  Reason for unsuccessful TCM follow-up call:  Unable to reach patient   Juanda Crumble LPN [place] Direct Dial [phone]   Transition Care Management Unsuccessful Follow-up Telephone Call  Date of discharge and from where:    TCM DC [facility] 06-03-22 Dx: breakthrough seizure    Attempts:  2nd Attempt  Reason for unsuccessful TCM follow-up call:  Unable to reach patient   Juanda Crumble LPN [place] Direct Dial [phone]  Transition Care Management Unsuccessful Follow-up Telephone Call  Date of discharge and from where:    [place] 06-03-22 Dx: breakthrough seizure    Attempts:  3rd Attempt  Reason for unsuccessful TCM follow-up call:  Unable to reach patient   Juanda Crumble LPN [place] Direct Dial [phone]

## 2022-06-15 ENCOUNTER — Encounter: Payer: Medicare Other | Attending: Physician Assistant | Admitting: Physician Assistant

## 2022-06-15 DIAGNOSIS — L89613 Pressure ulcer of right heel, stage 3: Secondary | ICD-10-CM | POA: Insufficient documentation

## 2022-06-15 DIAGNOSIS — Z8673 Personal history of transient ischemic attack (TIA), and cerebral infarction without residual deficits: Secondary | ICD-10-CM | POA: Diagnosis not present

## 2022-06-15 DIAGNOSIS — G4089 Other seizures: Secondary | ICD-10-CM | POA: Insufficient documentation

## 2022-06-15 DIAGNOSIS — F17218 Nicotine dependence, cigarettes, with other nicotine-induced disorders: Secondary | ICD-10-CM | POA: Insufficient documentation

## 2022-06-15 DIAGNOSIS — J449 Chronic obstructive pulmonary disease, unspecified: Secondary | ICD-10-CM | POA: Insufficient documentation

## 2022-06-16 NOTE — Progress Notes (Signed)
TASHAWNA, CAPOZZA ([id number]) [phone] [email or web address] Page 1 of 5 Visit Report for 06/15/2022 Abuse Risk Screen Details Patient Name: Date of Service: Molly Dennis, Molly Dennis 06/15/2022 1:00 PM Medical Record Number: [id number] Patient Account Number: [id number] Date of Birth/Sex: Treating RN: [birth year] (51 y.o. [name] Primary Care Yani Lal: Karl Ito Other Clinician: Referring Korianna Washer: Treating Caleigh Rabelo/Extender: Alvy Bimler in Treatment: 0 Abuse Risk Screen Items Answer ABUSE RISK SCREEN: Has anyone close to you tried to hurt or harm you recentlyo No Do you feel uncomfortable with anyone in your familyo Yes Has anyone forced you do things that you didnt want to doo No Electronic Signature(s) Signed: 06/16/2022 8:42:02 AM By: Rosalio Loud MSN RN CNS WTA Entered By: Rosalio Loud on 06/15/2022 13:38:08 -------------------------------------------------------------------------------- Activities of Daily Living Details Patient Name: Date of Service: Molly Dennis, Molly Dennis 06/15/2022 1:00 PM Medical Record Number: [id number] Patient Account Number: [id number] Date of Birth/Sex: Treating RN: 01-Jan-1971 (51 y.o. [name] Primary Care Alvenia Treese: Karl Ito Other Clinician: Referring Asherah Lavoy: Treating Alexie Samson/Extender: Alvy Bimler in Treatment: 0 Activities of Daily Living Items Answer Activities of Daily Living (Please select one for each item) Drive Automobile Not Able T Medications ake Completely Able Use T elephone Completely Able Care for Appearance Completely Able Use T oilet Completely Able Bath / Shower Completely Able Dress Self Completely Able Feed Self Completely Able Walk Completely Able Get In / Out Bed Completely Able Housework Completely AURELIA, DELVAL ([id number]) [phone] [email or web address] Page 2 of 5 Prepare Meals Completely Able Handle Money Completely  Able Shop for Self Completely Able Electronic Signature(s) Signed: 06/16/2022 8:42:02 AM By: Rosalio Loud MSN RN CNS WTA Entered By: Rosalio Loud on 06/15/2022 13:38:45 -------------------------------------------------------------------------------- Education Screening Details Patient Name: Date of Service: Molly Dennis, Molly Dennis 06/15/2022 1:00 PM Medical Record Number: [id number] Patient Account Number: [id number] Date of Birth/Sex: Treating RN: [birth year] (51 y.o. [name] Primary Care Lawrence Roldan: Karl Ito Other Clinician: Referring Aaira Oestreicher: Treating Lamyra Malcolm/Extender: Alvy Bimler in Treatment: 0 Learning Preferences/Education Level/Primary Language Learning Preference: Explanation Highest Education Level: High School Preferred Language: English Cognitive Barrier Language Barrier: No Translator Needed: No Memory Deficit: No Emotional Barrier: No Cultural/Religious Beliefs Affecting Medical Care: No Physical Barrier Impaired Vision: No Impaired Hearing: No Decreased Hand dexterity: No Knowledge/Comprehension Knowledge Level: High Comprehension Level: High Ability to understand written instructions: High Ability to understand verbal instructions: High Motivation Anxiety Level: Calm Cooperation: Cooperative Education Importance: Acknowledges Need Interest in Health Problems: Asks Questions Perception: Coherent Willingness to Engage in Self-Management High Activities: Readiness to Engage in Self-Management High Activities: Electronic Signature(s) Signed: 06/16/2022 8:42:02 AM By: Rosalio Loud MSN RN CNS WTA Entered By: Rosalio Loud on 06/15/2022 13:39:16 Helaine Chess ([id number]) [phone] [email or web address] Page 3 of 5 -------------------------------------------------------------------------------- Fall Risk Assessment Details Patient Name: Date of Service: Molly Dennis, Molly Dennis 06/15/2022 1:00 PM Medical Record Number:  [id number] Patient Account Number: [id number] Date of Birth/Sex: Treating RN: 1971-02-19 (51 y.o. [name] Primary Care Aylla Huffine: Karl Ito Other Clinician: Referring Tawn Fitzner: Treating Antwane Grose/Extender: Alvy Bimler in Treatment: 0 Fall Risk Assessment Items Have you had 2 or more falls in the last 12 monthso 0 Yes Have you had any fall that resulted in injury in the last 12 monthso 0 No FALLS RISK SCREEN History of falling - immediate or within 3 months 0 No Secondary diagnosis (Do you have 2 or more medical diagnoseso) 0 No Ambulatory aid None/bed rest/wheelchair/nurse 0 No Crutches/cane/walker 0 No Furniture 0  No Intravenous therapy Access/Saline/Heparin Lock 0 No Gait/Transferring Normal/ bed rest/ wheelchair 0 No Weak (short steps with or without shuffle, stooped but able to lift head while walking, may seek 0 No support from furniture) Impaired (short steps with shuffle, may have difficulty arising from chair, head down, impaired 0 No balance) Mental Status Oriented to own ability 0 No Electronic Signature(s) Signed: 06/16/2022 8:42:02 AM By: Midge Aver MSN RN CNS WTA Entered By: Midge Aver on 06/15/2022 13:39:47 -------------------------------------------------------------------------------- Foot Assessment Details Patient Name: Date of Service: Molly Dennis 06/15/2022 1:00 PM Medical Record Number: [id number] Patient Account Number: [id number] Date of Birth/Sex: Treating RN: 01/07/1971 (51 y.o. Ginette Pitman Primary Care Marybella Ethier: Mordecai Maes Other Clinician: Referring Zhane Donlan: Treating Louine Tenpenny/Extender: Delsa Grana in Treatment: 0 Foot Assessment Items Site Locations [place], [place] ([id number]) [id number] Page 4 of 5 + = Sensation present, - = Sensation absent, C = Callus, U = Ulcer R = Redness, W = Warmth, M = Maceration, PU = Pre-ulcerative lesion F =  Fissure, S = Swelling, D = Dryness Assessment Right: Left: Other Deformity: Yes No Prior Foot Ulcer: No No Prior Amputation: No No Charcot Joint: No No Ambulatory Status: Ambulatory Without Help Gait: Steady Electronic Signature(s) Signed: 06/16/2022 8:42:02 AM By: Midge Aver MSN RN CNS WTA Entered By: Midge Aver on 06/15/2022 13:41:09 -------------------------------------------------------------------------------- Nutrition Risk Screening Details Patient Name: Date of Service: Molly Dennis, Molly Dennis 06/15/2022 1:00 PM Medical Record Number: [id number] Patient Account Number: [id number] Date of Birth/Sex: Treating RN: 1970-09-10 (51 y.o. Ginette Pitman Primary Care Kaivon Livesey: Mordecai Maes Other Clinician: Referring Remus Hagedorn: Treating Amontae Ng/Extender: Andria Meuse, Neoma Laming in Treatment: 0 Height (in): 65 Weight (lbs): 170 Body Mass Index (BMI): 28.3 Nutrition Risk Screening Items Score Screening NUTRITION RISK SCREEN: I have an illness or condition that made me change the kind and/or amount of food I eat 0 No I eat fewer than two meals per day 0 No I eat few fruits and vegetables, or milk products 0 No I have three or more drinks of beer, liquor or wine almost every day 0 No I have tooth or mouth problems that make it hard for me to eat 2 Yes I don't always have enough money to buy the food I need 0 No Molly Dennis, Molly Dennis ([id number]) [id number] Page 5 of 5 I eat alone most of the time 0 No I take three or more different prescribed or over-the-counter drugs a day 0 No Without wanting to, I have lost or gained 10 pounds in the last six months 0 No I am not always physically able to shop, cook and/or feed myself 0 No Nutrition Protocols Good Risk Protocol 0 No interventions needed Moderate Risk Protocol High Risk Proctocol Risk Level: Good Risk Score: 2 Electronic Signature(s) Signed: 06/16/2022 8:42:02 AM By: Midge Aver MSN  RN CNS WTA Entered By: Midge Aver on 06/15/2022 13:40:50

## 2022-06-17 NOTE — Progress Notes (Addendum)
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 Barrister's clerk) Signed: 06/16/2022 8:42:02 AM By: Rosalio Loud MSN RN CNS WTA Signed: 06/17/2022 2:43:36 PM By: Worthy Keeler PA-C Entered By: Rosalio Loud on 06/15/2022 13:37:48 -------------------------------------------------------------------------------- SuperBill Details Patient Name: Date of Service: Molly Dennis, Molly Dennis 06/15/2022 Medical Record Number: [id number] Patient Account Number: [id number] Date of Birth/Sex: Treating RN: 11-Jun-1971 (51 y.o. 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), and cerebral infarction without residual deficits G40.89 Other seizures Facility Procedures : CPT4 Code: [id number] Description: 11042 - DEB SUBQ TISSUE 20 SQ CM/< ICD-10 Diagnosis Description L89.613 Pressure ulcer of right heel, stage 3 Modifier: Quantity: 1 : CPT4 Code: [id number] Description: 99406-SMOKING CESSATION 3-10MINS ICD-10 Diagnosis Description F17.218 Nicotine dependence, cigarettes, with other nicotine-induced disorders Modifier: Quantity: 1 Physician Procedures : CPT4 Code Description Modifier [id number] WC PHYS LEVEL 3 NEW PT 25 ICD-10 Diagnosis Description L89.613 Pressure ulcer of right heel, stage 3 J44.9 Chronic obstructive pulmonary disease, unspecified Molly Dennis, Maymie ([id number])  [id number] F17.218 Nicotine dependence, cigarettes, 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

## 2022-06-17 NOTE — Progress Notes (Signed)
Croson, Garnett ([id number]) [id number] Page 1 of 12 Visit Report for 06/15/2022 Allergy List Details Patient Name: Date of Service: Molly Dennis, Molly Dennis 06/15/2022 1:00 PM Medical Record Number: [id number] Patient Account Number: [id number] Date of Birth/Sex: Treating RN: 05/26/1971 (50 y.o. F) Smith, Vicki Primary Care : Cable, James Other Clinician: Referring : Treating /Extender: Stone, Hoyt Scheeler, Matthew Weeks in Treatment: 0 Allergies Active Allergies No Known Allergies Allergy Notes Electronic Signature(s) Signed: 06/15/2022 4:57:16 PM By: Smith, Vicki MSN RN CNS WTA Entered By: Smith, Vicki on 06/15/2022 16:57:15 -------------------------------------------------------------------------------- Arrival Information Details Patient Name: Date of Service: Molly Dennis, Molly Dennis 06/15/2022 1:00 PM Medical Record Number: [id number] Patient Account Number: [id number] Date of Birth/Sex: Treating RN: 03/05/1971 (50 y.o. F) Smith, Vicki Primary Care : Cable, James Other Clinician: Referring : Treating /Extender: Stone, Hoyt Scheeler, Matthew Weeks in Treatment: 0 Visit Information Patient Arrived: Ambulatory Arrival Time: 13:06 Accompanied By: sister Transfer Assistance: None Patient Identification Verified: Yes Secondary Verification Process Completed: Yes Patient Requires Transmission-Based Precautions: No Patient Has Alerts: Yes Patient Alerts: ABI R 1.29 Electronic Signature(s) Signed: 06/15/2022 4:52:14 PM By: Smith, Vicki MSN RN CNS WTA Entered By: Smith, Vicki on 06/15/2022 16:52:14 Molly Dennis, Molly Dennis ([id number]) [id number] Page 2 of 12 -------------------------------------------------------------------------------- Clinic Level of Care Assessment Details Patient Name: Date of Service: Molly Dennis, Molly Dennis 06/15/2022 1:00 PM Medical Record Number: [id number] Patient Account  Number: [id number] Date of Birth/Sex: Treating RN: 02/07/1971 (50 y.o. F) Smith, Vicki Primary Care : Cable, James Other Clinician: Referring : Treating /Extender: Stone, Hoyt Scheeler, Matthew Weeks in Treatment: 0 Clinic Level of Care Assessment Items TOOL 1 Quantity Score [] - 0 Use when EandM and Procedure is performed on INITIAL visit ASSESSMENTS - Nursing Assessment / Reassessment [] - 0 General Physical Exam (combine w/ comprehensive assessment (listed just below) when performed on new pt. evals) [] - 0 Comprehensive Assessment (HX, ROS, Risk Assessments, Wounds Hx, etc.) ASSESSMENTS - Wound and Skin Assessment / Reassessment [] - 0 Dermatologic / Skin Assessment (not related to wound area) ASSESSMENTS - Ostomy and/or Continence Assessment and Care [] - 0 Incontinence Assessment and Management [] - 0 Ostomy Care Assessment and Management (repouching, etc.) PROCESS - Coordination of Care [] - 0 Simple Patient / Family Education for ongoing care [] - 0 Complex (extensive) Patient / Family Education for ongoing care [] - 0 Staff obtains Consents, Records, T Results / Process Orders est [] - 0 Staff telephones HHA, Nursing Homes / Clarify orders / etc [] - 0 Routine Transfer to another Facility (non-emergent condition) [] - 0 Routine Hospital Admission (non-emergent condition) [] - 0 New Admissions / Insurance Authorizations / Ordering NPWT Apligraf, etc. , [] - 0 Emergency Hospital Admission (emergent condition) PROCESS - Special Needs [] - 0 Pediatric / Minor Patient Management [] - 0 Isolation Patient Management [] - 0 Hearing / Language / Visual special needs [] - 0 Assessment of Community assistance (transportation, D/C planning, etc.) [] - 0 Additional assistance / Altered mentation [] - 0 Support Surface(s) Assessment (bed, cushion, seat, etc.) INTERVENTIONS - Miscellaneous [] - 0 External ear exam [] - 0 Patient Transfer  (multiple staff / Hoyer Lift / Similar devices) [] - 0 Simple Staple / Suture removal (25 or less) [] - 0 Complex Staple / Suture removal (26 or more) Molly Dennis, Molly Dennis ([id number]) [id number] Page 3 of 12 [] - 0 Hypo/Hyperglycemic Management (do not check if billed separately) [] - 0 Ankle / Brachial Index (ABI) - do not check if   billed separately Has the patient been seen at the hospital within the last three years: Yes Total Score: 0 Level Of Care: ____ Electronic Signature(s) Signed: 06/16/2022 8:42:02 AM By: Rosalio Loud MSN RN CNS WTA Entered By: Rosalio Loud on 06/16/2022 07:54:12 -------------------------------------------------------------------------------- Encounter Discharge Information Details Patient Name: Date of Service: Molly Dennis 06/15/2022 1:00 PM Medical Record Number: [id number] Patient Account Number: [id number] Date of Birth/Sex: Treating RN: [birth year] (51 y.o. [name] Primary Care Keithen Capo: Karl Ito Other Clinician: Referring Kaytelynn Scripter: Treating Fabricio Endsley/Extender: Alvy Bimler in Treatment: 0 Encounter Discharge Information Items Post Procedure Vitals Discharge Condition: Stable Temperature (F): 98.3 Ambulatory Status: Ambulatory Pulse (bpm): 89 Discharge Destination: Other (Note Required) Respiratory Rate (breaths/min): 16 Telephoned: No Blood Pressure (mmHg): 106/71 Orders Sent: Yes Transportation: Private Auto Accompanied By: sister Schedule Follow-up Appointment: Yes Clinical Summary of Care: Electronic Signature(s) Signed: 06/16/2022 8:08:15 AM By: Rosalio Loud MSN RN CNS WTA Entered By: Rosalio Loud on 06/16/2022 08:08:14 -------------------------------------------------------------------------------- Lower Extremity Assessment Details Patient Name: Date of Service: Molly Dennis, Molly Dennis 06/15/2022 1:00 PM Medical Record Number: [id number] Patient Account Number:  [id number] Date of Birth/Sex: Treating RN: [birth year] (51 y.o. [name] Primary Care Aneudy Champlain: Karl Ito Other Clinician: Referring Vali Capano: Treating Saraih Lorton/Extender: Willy Eddy, Johnna Acosta in Treatment: 0 Edema Assessment Assessed: Shirlyn Goltz: No] [Right: No] U[LeftKENNETH, LAX ([id number])] [Right: [id number] Page 4 of 12] Edema: [Left: N] [Right: o] Vascular Assessment Pulses: Dorsalis Pedis Palpable: [Right:Yes] Blood Pressure: Brachial: [Right:70] Ankle: [Right:Posterior Tibial: 90 1.29] Electronic Signature(s) Signed: 06/15/2022 4:53:20 PM By: Rosalio Loud MSN RN CNS WTA Entered By: Rosalio Loud on 06/15/2022 16:53:20 -------------------------------------------------------------------------------- Multi Wound Chart Details Patient Name: Date of Service: Molly Dennis 06/15/2022 1:00 PM Medical Record Number: [id number] Patient Account Number: [id number] Date of Birth/Sex: Treating RN: 12-21-1970 (51 y.o. [name] Primary Care Xitlally Mooneyham: Karl Ito Other Clinician: Referring Annaliese Saez: Treating Cristhian Vanhook/Extender: Alvy Bimler in Treatment: 0 Vital Signs Height(in): 65 Pulse(bpm): 89 Weight(lbs): 170 Blood Pressure(mmHg): 106/71 Body Mass Index(BMI): 28.3 Temperature(F): 98.3 Respiratory Rate(breaths/min): 18 [1:Photos:] [N/A:N/A] Right, Proximal Calcaneus Right, Proximal Calcaneus N/A Wound Location: Trauma Trauma N/A Wounding Event: Atypical Atypical N/A Primary Etiology: N/A Asthma, Chronic Obstructive N/A Comorbid History: Pulmonary Disease (COPD) 02/14/2021 02/14/2021 N/A Date Acquired: 0 0 N/A Weeks of Treatment: Open Open N/A Wound Status: No No N/A Wound Recurrence: 1.4x2x0.1 0.6x0.6x0.1 N/A Measurements L x W x D (cm) 2.199 0.283 N/A A (cm) : rea 0.22 0.028 N/A Volume (cm) : Full Thickness Without Exposed Partial Thickness N/A Classification: Support  Structures Medium Small N/A Exudate Amount: Serosanguineous Serosanguineous N/A Exudate Type: red, brown red, brown N/A Exudate Color: Small (1-33%) Small (1-33%) N/A Granulation Amount: N/A Pink N/A Granulation Quality: Molly Dennis, Molly Dennis ([id number]) [id number] Page 5 of 12 None Present (0%) N/A N/A Necrotic Amount: Fat Layer (Subcutaneous Tissue): Yes Fat Layer (Subcutaneous Tissue): Yes N/A Exposed Structures: Fascia: No Fascia: No Tendon: No Tendon: No Muscle: No Muscle: No Joint: No Joint: No Bone: No Bone: No Small (1-33%) Small (1-33%) N/A Epithelialization: Debridement - Excisional Debridement - Excisional N/A Debridement: Pre-procedure Verification/Time Out 14:12 14:12 N/A Taken: Callus, Subcutaneous, Slough Callus, Subcutaneous, Slough N/A Tissue Debrided: Skin/Subcutaneous Tissue Skin/Subcutaneous Tissue N/A Level: 2.8 0.36 N/A Debridement A (sq cm): rea Curette Curette N/A Instrument: Minimum Minimum N/A Bleeding: Pressure Pressure N/A Hemostasis A chieved: Procedure was tolerated well Procedure was tolerated well N/A Debridement Treatment Response: 1.4x2x0.2 0.6x0.6x0.2 N/A Post Debridement Measurements L x W x D (cm)  0.44 0.057 N/A Post Debridement Volume: (cm) Debridement Debridement N/A Procedures Performed: Treatment Notes Wound #1 (Calcaneus) Wound Laterality: Right, Proximal Cleanser Soap and Water Discharge Instruction: Gently cleanse wound with antibacterial soap, rinse and pat dry prior to dressing wounds Peri-Wound Care Topical Primary Dressing Hydrofera Blue Ready Transfer Foam, 2.5x2.5 (in/in) Discharge Instruction: Apply Hydrofera Blue Ready to wound bed as directed Secondary Dressing ABD Pad 5x9 (in/in) Discharge Instruction: Cover with ABD pad Secured With Kerlix Roll Sterile or Non-Sterile 6-ply 4.5x4 (yd/yd) Discharge Instruction: Apply Kerlix as directed Compression Wrap Compression  Stockings Add-Ons Wound #2 (Calcaneus) Wound Laterality: Right, Proximal Cleanser Byram Ancillary Kit - 15 Day Supply Discharge Instruction: Use supplies as instructed; Kit contains: (15) Saline Bullets; (15) 3x3 Gauze; 15 pr Gloves Soap and Water Discharge Instruction: Gently cleanse wound with antibacterial soap, rinse and pat dry prior to dressing wounds Peri-Wound Care Topical Primary Dressing Hydrofera Blue Ready Transfer Foam, 2.5x2.5 (in/in) Discharge Instruction: Apply Hydrofera Blue Ready to wound bed as directed Secondary Dressing ABD Pad 5x9 (in/in) Discharge Instruction: Cover with ABD pad Secured With Kerlix Roll Sterile or Non-Sterile 6-ply 4.5x4 (yd/yd) Discharge Instruction: Apply Kerlix as directed Compression Wrap Herbel, Lynne ([id number]) [id number] Page 6 of 12 Compression Stockings Add-Ons Electronic Signature(s) Signed: 06/16/2022 8:05:59 AM By: Smith, Vicki MSN RN CNS WTA Previous Signature: 06/16/2022 7:46:55 AM Version By: Smith, Vicki MSN RN CNS WTA Entered By: Smith, Vicki on 06/16/2022 08:05:59 -------------------------------------------------------------------------------- Multi-Disciplinary Care Plan Details Patient Name: Date of Service: Molly Dennis, Tashanti 06/15/2022 1:00 PM Medical Record Number: [id number] Patient Account Number: [id number] Date of Birth/Sex: Treating RN: 12/24/1970 (50 y.o. F) Smith, Vicki Primary Care : Cable, James Other Clinician: Referring : Treating /Extender: Stone, Hoyt Scheeler, Matthew Weeks in Treatment: 0 Active Inactive Necrotic Tissue Nursing Diagnoses: Impaired tissue integrity related to necrotic/devitalized tissue Knowledge deficit related to management of necrotic/devitalized tissue Goals: Necrotic/devitalized tissue will be minimized in the wound bed Date Initiated: 06/16/2022 Target Resolution Date: 07/15/2022 Goal Status: Active Patient/caregiver  will verbalize understanding of reason and process for debridement of necrotic tissue Date Initiated: 06/16/2022 Target Resolution Date: 07/15/2022 Goal Status: Active Interventions: Assess patient pain level pre-, during and post procedure and prior to discharge Provide education on necrotic tissue and debridement process Treatment Activities: Apply topical anesthetic as ordered : 06/15/2022 Excisional debridement : 06/15/2022 Notes: Orientation to the Wound Care Program Nursing Diagnoses: Knowledge deficit related to the wound healing center program Goals: Patient/caregiver will verbalize understanding of the Wound Healing Center Program Date Initiated: 06/16/2022 Target Resolution Date: 07/11/2022 Goal Status: Active Interventions: Provide education on orientation to the wound center Notes: Wound/Skin Impairment Molly Dennis, Molly Dennis ([id number]) [id number] Page 7 of 12 Nursing Diagnoses: Impaired tissue integrity Knowledge deficit related to smoking impact on wound healing Knowledge deficit related to ulceration/compromised skin integrity Goals: Patient will demonstrate a reduced rate of smoking or cessation of smoking Date Initiated: 06/16/2022 Target Resolution Date: 07/15/2022 Goal Status: Active Patient will have a decrease in wound volume by X% from date: (specify in notes) Date Initiated: 06/16/2022 Target Resolution Date: 07/15/2022 Goal Status: Active Patient/caregiver will verbalize understanding of skin care regimen Date Initiated: 06/16/2022 Target Resolution Date: 07/15/2022 Goal Status: Active Ulcer/skin breakdown will have a volume reduction of 30% by week 4 Date Initiated: 06/16/2022 Target Resolution Date: 07/15/2022 Goal Status: Active Ulcer/skin breakdown will have a volume reduction of 50% by week 8 Date Initiated: 06/16/2022 Target Resolution Date: 08/15/2022 Goal Status: Active Ulcer/skin breakdown will have a volume  reduction of 80%   by week 12 Date Initiated: 06/16/2022 Target Resolution Date: 09/15/2022 Goal Status: Active Ulcer/skin breakdown will heal within 14 weeks Date Initiated: 06/16/2022 Target Resolution Date: 09/29/2022 Goal Status: Active Interventions: Assess patient/caregiver ability to obtain necessary supplies Assess patient/caregiver ability to perform ulcer/skin care regimen upon admission and as needed Assess ulceration(s) every visit Provide education on smoking Provide education on ulcer and skin care Treatment Activities: Patient referred to home care : 06/15/2022 Referred to DME Allanah Mcfarland for dressing supplies : 06/15/2022 Skin care regimen initiated : 06/15/2022 Smoking cessation education : 06/15/2022 Topical wound management initiated : 06/15/2022 Notes: Electronic Signature(s) Signed: 06/16/2022 7:46:27 AM By: Rosalio Loud MSN RN CNS WTA Entered By: Rosalio Loud on 06/16/2022 07:46:27 -------------------------------------------------------------------------------- Pain Assessment Details Patient Name: Date of Service: Molly Dennis, Molly Dennis 06/15/2022 1:00 PM Medical Record Number: [id number] Patient Account Number: [id number] Date of Birth/Sex: Treating RN: Dec 26, 1970 (51 y.o. [name] Primary Care Melisia Leming: Karl Ito Other Clinician: Referring Eric Nees: Treating Elmore Hyslop/Extender: Alvy Bimler in Treatment: 0 Active Problems Location of Pain Severity and Description of Pain Isabell, Molly Dennis ([id number]) [id number] Page 8 of 12 Patient Has Paino No Site Locations With Dressing Change: Yes Duration of the Pain. Constant / Intermittento Intermittent How Long Does it Lasto Hours: 2 Minutes: Rate the pain. Current Pain Level: 8 Worst Pain Level: 10 Least Pain Level: 5 Tolerable Pain Level: 3 Character of Pain Describe the Pain: Shooting, Throbbing Pain Management and Medication Current Pain  Management: Medication: No Cold Application: No Rest: Yes Massage: No Activity: No T.E.N.S.: No Heat Application: No Leg drop or elevation: No How does your wound impact your activities of daily livingo Sleep: No Bathing: No Appetite: No Relationship With Others: No Bladder Continence: No Emotions: No Bowel Continence: No Work: No Toileting: No Drive: No Dressing: No Hobbies: No Electronic Signature(s) Signed: 06/15/2022 4:52:20 PM By: Rosalio Loud MSN RN CNS WTA Entered By: Rosalio Loud on 06/15/2022 16:52:19 -------------------------------------------------------------------------------- Patient/Caregiver Education Details Patient Name: Date of Service: Molly Dennis 11/13/2023andnbsp1:00 PM Medical Record Number: [id number] Patient Account Number: [id number] Date of Birth/Gender: Treating RN: 1971-04-04 (51 y.o. [name] Primary Care Physician: Karl Ito Other Clinician: Referring Physician: Treating Physician/Extender: Alvy Bimler in Treatment: 0 Education Assessment Education Provided To: Patient Education Topics Provided Offloading: Handouts: What is MERIAL, MORITZ ([id number]) [id number] Page 9 of 12 Methods: Explain/Verbal Responses: State content correctly Smoking and Wound Healing: Handouts: Smoking and Wound Healing Methods: Explain/Verbal Responses: State content correctly Welcome T [place]: o Handouts: Welcome T [place] o Methods: Explain/Verbal Responses: State content correctly Wound Debridement: Handouts: Wound Debridement Methods: Explain/Verbal Responses: State content correctly Wound/Skin Impairment: Handouts: Caring for Your Ulcer Methods: Explain/Verbal Responses: State content correctly Electronic Signature(s) Signed: 06/16/2022 8:42:02 AM By: Rosalio Loud MSN RN CNS WTA Entered By: Rosalio Loud on 06/16/2022  07:55:15 -------------------------------------------------------------------------------- Wound Assessment Details Patient Name: Date of Service: Molly Dennis, Molly Dennis 06/15/2022 1:00 PM Medical Record Number: [id number] Patient Account Number: [id number] Date of Birth/Sex: Treating RN: Dec 04, 1970 (51 y.o. [name] Primary Care Staisha Winiarski: Karl Ito Other Clinician: Referring Tomma Ehinger: Treating Gyasi Hazzard/Extender: Alvy Bimler in Treatment: 0 Wound Status Wound Number: 1 Primary Etiology: Pressure Ulcer Wound Location: Right, Plantar Calcaneus Wound Status: Open Wounding Event: Trauma Comorbid History: Asthma, Chronic Obstructive Pulmonary Disease (COPD) Date Acquired: 02/14/2021 Weeks Of Treatment: 0 Clustered Wound: No Photos Wound Measurements Tramble, Maybree ([id number]) Length: (cm) 1.4 Width: (cm) 2 Depth: (cm) 0.1  Area: (cm) 2.199 Volume: (cm) 0.22 [id number] Page 10 of 12 % Reduction in Area: % Reduction in Volume: Epithelialization: Small (1-33%) Tunneling: No Undermining: No Wound Description Classification: Category/Stage III Wound Margin: Distinct, outline attached Exudate Amount: Medium Exudate Type: Serosanguineous Exudate Color: red, brown Foul Odor After Cleansing: No Slough/Fibrino No Wound Bed Granulation Amount: Large (67-100%) Exposed Structure Granulation Quality: Pink Fascia Exposed: No Necrotic Amount: Small (1-33%) Fat Layer (Subcutaneous Tissue) Exposed: Yes Necrotic Quality: Adherent Slough Tendon Exposed: No Muscle Exposed: No Joint Exposed: No Bone Exposed: No Electronic Signature(s) Signed: 06/17/2022 2:52:19 PM By: Worthy Keeler PA-C Signed: 06/17/2022 5:08:09 PM By: Rosalio Loud MSN RN CNS WTA Previous Signature: 06/16/2022 8:42:02 AM Version By: Rosalio Loud MSN RN CNS WTA Entered By: Worthy Keeler on 06/17/2022  14:52:18 -------------------------------------------------------------------------------- Wound Assessment Details Patient Name: Date of Service: Molly Dennis, Molly Dennis 06/15/2022 1:00 PM Medical Record Number: [id number] Patient Account Number: [id number] Date of Birth/Sex: Treating RN: Oct 06, 1970 (51 y.o. [name] Primary Care Mosie Angus: Karl Ito Other Clinician: Referring Zaneta Lightcap: Treating Jakarie Pember/Extender: Alvy Bimler in Treatment: 0 Wound Status Wound Number: 2 Primary Etiology: Pressure Ulcer Wound Location: Right, Proximal, Posterior Calcaneus Wound Status: Open Wounding Event: Trauma Comorbid History: Asthma, Chronic Obstructive Pulmonary Disease (COPD) Date Acquired: 02/14/2021 Weeks Of Treatment: 0 Clustered Wound: No Photos Wound Measurements Molly Dennis, Molly Dennis ([id number]) Length: (cm) 0.6 Width: (cm) 0.6 Depth: (cm) 0.1 Area: (cm) 0.283 Volume: (cm) 0.028 [id number] Page 11 of 12 % Reduction in Area: % Reduction in Volume: Epithelialization: Small (1-33%) Tunneling: No Undermining: No Wound Description Classification: Category/Stage III Wound Margin: Distinct, outline attached Exudate Amount: Small Exudate Type: Serosanguineous Exudate Color: red, brown Foul Odor After Cleansing: No Slough/Fibrino No Wound Bed Granulation Amount: Large (67-100%) Exposed Structure Granulation Quality: Pink Fascia Exposed: No Necrotic Amount: Small (1-33%) Fat Layer (Subcutaneous Tissue) Exposed: Yes Necrotic Quality: Adherent Slough Tendon Exposed: No Muscle Exposed: No Joint Exposed: No Bone Exposed: No Treatment Notes Wound #2 (Calcaneus) Wound Laterality: Right, Proximal Cleanser Byram Ancillary Kit - 15 Day Supply Discharge Instruction: Use supplies as instructed; Kit contains: (15) Saline Bullets; (15) 3x3 Gauze; 15 pr Gloves Soap and Water Discharge Instruction: Gently cleanse wound with  antibacterial soap, rinse and pat dry prior to dressing wounds Peri-Wound Care Topical Primary Dressing Hydrofera Blue Ready Transfer Foam, 2.5x2.5 (in/in) Discharge Instruction: Apply Hydrofera Blue Ready to wound bed as directed Secondary Dressing ABD Pad 5x9 (in/in) Discharge Instruction: Cover with ABD pad Secured With Kerlix Roll Sterile or Non-Sterile 6-ply 4.5x4 (yd/yd) Discharge Instruction: Apply Kerlix as directed Compression Wrap Compression Stockings Add-Ons Electronic Signature(s) Signed: 06/17/2022 2:53:12 PM By: Worthy Keeler PA-C Signed: 06/17/2022 5:08:09 PM By: Rosalio Loud MSN RN CNS WTA Previous Signature: 06/16/2022 8:42:02 AM Version By: Rosalio Loud MSN RN CNS WTA Entered By: Worthy Keeler on 06/17/2022 14:53:11 Vitals Details -------------------------------------------------------------------------------- Helaine Chess ([id number]) [id number] Page 12 of 12 Patient Name: Date of Service: MICA, RAMDASS 06/15/2022 1:00 PM Medical Record Number: [id number] Patient Account Number: [id number] Date of Birth/Sex: Treating RN: 05-13-1971 (51 y.o. [name] Primary Care Dakoda Bassette: Karl Ito Other Clinician: Referring Wes Lezotte: Treating Kortny Lirette/Extender: Alvy Bimler in Treatment: 0 Vital Signs Time Taken: 13:14 Temperature (F): 98.3 Height (in): 65 Pulse (bpm): 89 Source: Stated Respiratory Rate (breaths/min): 18 Weight (lbs): 170 Blood Pressure (mmHg): 106/71 Source: Stated Reference Range: 80 - 120 mg / dl Body Mass Index (BMI): 28.3 Electronic Signature(s) Signed: 06/15/2022 4:52:26 PM By: Tamala Julian,  Jocelyn Lamer MSN RN CNS WTA Entered By: Rosalio Loud on 06/15/2022 16:52:25

## 2022-06-22 ENCOUNTER — Ambulatory Visit: Payer: Medicare Other | Admitting: Physician Assistant

## 2022-06-26 ENCOUNTER — Other Ambulatory Visit: Payer: Self-pay | Admitting: Nurse Practitioner

## 2022-06-29 ENCOUNTER — Ambulatory Visit: Payer: Medicare Other | Admitting: Physician Assistant

## 2022-07-08 ENCOUNTER — Ambulatory Visit (INDEPENDENT_AMBULATORY_CARE_PROVIDER_SITE_OTHER): Payer: Medicare Other | Admitting: Nurse Practitioner

## 2022-07-08 ENCOUNTER — Encounter: Payer: Self-pay | Admitting: Nurse Practitioner

## 2022-07-08 VITALS — BP 110/66 | HR 72 | Temp 97.2°F | Ht 65.0 in | Wt 132.0 lb

## 2022-07-08 DIAGNOSIS — J441 Chronic obstructive pulmonary disease with (acute) exacerbation: Secondary | ICD-10-CM

## 2022-07-08 DIAGNOSIS — S91301D Unspecified open wound, right foot, subsequent encounter: Secondary | ICD-10-CM | POA: Diagnosis not present

## 2022-07-08 DIAGNOSIS — F1420 Cocaine dependence, uncomplicated: Secondary | ICD-10-CM

## 2022-07-08 DIAGNOSIS — G40919 Epilepsy, unspecified, intractable, without status epilepticus: Secondary | ICD-10-CM | POA: Diagnosis not present

## 2022-07-08 DIAGNOSIS — G629 Polyneuropathy, unspecified: Secondary | ICD-10-CM | POA: Diagnosis not present

## 2022-07-08 DIAGNOSIS — Z8673 Personal history of transient ischemic attack (TIA), and cerebral infarction without residual deficits: Secondary | ICD-10-CM

## 2022-07-08 DIAGNOSIS — Z206 Contact with and (suspected) exposure to human immunodeficiency virus [HIV]: Secondary | ICD-10-CM | POA: Insufficient documentation

## 2022-07-08 DIAGNOSIS — R569 Unspecified convulsions: Secondary | ICD-10-CM | POA: Insufficient documentation

## 2022-07-08 MED ORDER — AMOXICILLIN-POT CLAVULANATE 875-125 MG PO TABS: 1.0000 | ORAL_TABLET | Freq: Two times a day (BID) | ORAL | 0 refills | Status: AC

## 2022-07-08 MED ORDER — LEVETIRACETAM 500 MG PO TABS: 500.0000 mg | ORAL_TABLET | Freq: Two times a day (BID) | ORAL | 5 refills | Status: DC

## 2022-07-08 MED ORDER — GABAPENTIN 300 MG PO CAPS: 300.0000 mg | ORAL_CAPSULE | Freq: Three times a day (TID) | ORAL | 2 refills | Status: DC

## 2022-07-08 MED ORDER — PREDNISONE 20 MG PO TABS: ORAL_TABLET | ORAL | 0 refills | Status: AC

## 2022-07-08 NOTE — Assessment & Plan Note (Signed)
Patient has open wound on her heel that is being followed by wound clinic.  Continue following with them continue doing dressing changes as prescribed

## 2022-07-08 NOTE — Assessment & Plan Note (Signed)
Stable at baseline patient ambulatory in office without assistive device.

## 2022-07-08 NOTE — Progress Notes (Signed)
Established Patient Office Visit  Subjective   Patient ID: Molly Dennis, female    DOB: 07/22/1971  Age: 51 y.o. MRN: [id number]  Chief Complaint  Patient presents with   Follow-up    HPI  Hospital follow up: Patient was admitted to the hospital on 06/01/2022 with seizures.  Discharged on 06/03/2022.  Patient was followed by neurology inpatient and they did the EEG they suspect breakthrough seizure secondary to substance misuse and nonadherence to Keppra. States that she will use once a day and use a 10 dollar piece (of cocaine). She is needing a refill on her keppra  [facility] she is living.  States that she a person that was exposed from to HIV.  States 3 months ago was the last encounter  Cough: states that she has been coughing for approx 1 month. Has been vaccinated against covid. Negative for covid on 05/31/2022 during her hospitalization. Has been using the albuterol inhaler. Has not been     Review of Systems  Constitutional:  Positive for fever. Negative for chills.  HENT:  Negative for ear discharge, ear pain and sore throat.   Respiratory:  Positive for cough, sputum production, shortness of breath and wheezing.   Cardiovascular:  Negative for chest pain and palpitations.  Musculoskeletal:  Positive for myalgias. Negative for joint pain.  Skin:        Wound care clinic   Neurological:  Positive for headaches.      Objective:     BP 110/66 (BP Location: Left Arm, Patient Position: Sitting, Cuff Size: Normal)   Pulse 72   Temp (!) 97.2 F (36.2 C) (Temporal)   Ht 5\' 5"  (1.651 m)   Wt 132 lb (59.9 kg)   LMP  (LMP Unknown)   SpO2 99%   BMI 21.97 kg/m  BP Readings from Last 3 Encounters:  07/08/22 110/66  06/03/22 (!) 144/92  03/06/22 128/84   Wt Readings from Last 3 Encounters:  07/08/22 132 lb (59.9 kg)  05/31/22 180 lb (81.6 kg)  03/06/22 160 lb (72.6 kg)      Physical Exam Vitals and nursing note reviewed.  Constitutional:       Appearance: Normal appearance.  HENT:     Right Ear: Tympanic membrane, ear canal and external ear normal.     Left Ear: Tympanic membrane, ear canal and external ear normal.     Nose:     Right Sinus: No maxillary sinus tenderness or frontal sinus tenderness.     Left Sinus: No maxillary sinus tenderness or frontal sinus tenderness.     Mouth/Throat:     Mouth: Mucous membranes are moist.     Pharynx: Oropharynx is clear.  Cardiovascular:     Rate and Rhythm: Normal rate and regular rhythm.     Heart sounds: Normal heart sounds.  Pulmonary:     Effort: Pulmonary effort is normal.     Breath sounds: Wheezing and rhonchi present.  Neurological:     Mental Status: She is alert. Mental status is at baseline.     Comments: Left sided weakness. Baseline s/p CVA      No results found for any visits on 07/08/22.    The 10-year ASCVD risk score (Arnett DK, et al., 2019) is: 5.1%    Assessment & Plan:   Problem List Items Addressed This Visit       Respiratory   COPD exacerbation (HCC) - Primary    Will treat patient with COPD exacerbation  Augmentin and prednisone.  Follow-up if no improvement      Relevant Medications   predniSONE (DELTASONE) 20 MG tablet   amoxicillin-clavulanate (AUGMENTIN) 875-125 MG tablet     Nervous and Auditory   Breakthrough seizure (HCC)    Patient has been adherent to Keppra refill provided today.  Encourage patient to stop cocaine use      Relevant Medications   levETIRAcetam (KEPPRA) 500 MG tablet   gabapentin (NEURONTIN) 300 MG capsule   Neuropathy    Patient needs refill on gabapentin refill  provided.      Relevant Medications   gabapentin (NEURONTIN) 300 MG capsule     Other   Open wound of heel    Patient has open wound on her heel that is being followed by wound clinic.  Continue following with them continue doing dressing changes as prescribed      Moderate cocaine use disorder [facility])    Patient still using cocaine.   Encouraged cessation      History of CVA (cerebrovascular accident)    Stable at baseline patient ambulatory in office without assistive device.      Exposure to HIV    States she had unprotected intercourse with a person who after the fact told her had "stage IV AIDS".  Pending HIV lab test      Relevant Orders   HIV Antibody (routine testing w rflx)   Seizure (HCC)    History of the same was maintained on Keppra 500 mg twice daily continue same      Relevant Medications   levETIRAcetam (KEPPRA) 500 MG tablet   gabapentin (NEURONTIN) 300 MG capsule    Return in about 3 months (around 10/07/2022) for weight and condition recheck .    Audria Nine, NP

## 2022-07-08 NOTE — Assessment & Plan Note (Signed)
History of the same was maintained on Keppra 500 mg twice daily continue same

## 2022-07-08 NOTE — Patient Instructions (Signed)
Nice to see you today I want to see you in 3 months to check on your breathing and your weight, sooner if you need me

## 2022-07-08 NOTE — Assessment & Plan Note (Signed)
Patient has been adherent to Keppra refill provided today.  Encourage patient to stop cocaine use

## 2022-07-08 NOTE — Assessment & Plan Note (Signed)
Will treat patient with COPD exacerbation Augmentin and prednisone.  Follow-up if no improvement

## 2022-07-08 NOTE — Assessment & Plan Note (Signed)
States she had unprotected intercourse with a person who after the fact told her had "stage IV AIDS".  Pending HIV lab test

## 2022-07-08 NOTE — Assessment & Plan Note (Addendum)
Patient needs refill on gabapentin refill  provided.

## 2022-07-08 NOTE — Assessment & Plan Note (Signed)
Patient still using cocaine.  Encouraged cessation

## 2022-07-10 ENCOUNTER — Other Ambulatory Visit: Payer: Self-pay | Admitting: Nurse Practitioner

## 2022-07-10 ENCOUNTER — Telehealth: Payer: Self-pay | Admitting: Infectious Disease

## 2022-07-10 DIAGNOSIS — Z21 Asymptomatic human immunodeficiency virus [HIV] infection status: Secondary | ICD-10-CM

## 2022-07-10 LAB — HIV-1/2 AB - DIFFERENTIATION
HIV-1 antibody: POSITIVE — AB
HIV-2 Ab: NEGATIVE

## 2022-07-10 LAB — HIV ANTIBODY (ROUTINE TESTING W REFLEX): HIV 1&2 Ab, 4th Generation: REACTIVE — AB

## 2022-07-10 NOTE — Telephone Encounter (Signed)
Fayrene Fearing I see that Cariann is newly diagnosed patient with HIV  We would be happy to get her into the clinic and see her  As a newly diagnosed patient she would be eligible for a clinical trial of starting Dovato (a first line therapy for HIV) and with opportunity in trial to either stay on Dovato or go onto long acting therapy.  I think the trial is a great opportunity for her with options for extra attention, free meds, monetary compensation and greater privacy  in our research unit.  She would need to be seen in our clinic first however (and she may not prefer trial)  Have you informed her of her diagnosis --I know she knew she was exposed to someone with HIV  IF you have we can certainly reach out to her and get her into clinic next week

## 2022-07-10 NOTE — Telephone Encounter (Signed)
Dr. Daiva Eves,  I just got off the phone with her and let her know I was referring her to the ID clinic  Thanks, Audria Nine, DNP, AGNP-C

## 2022-07-14 ENCOUNTER — Telehealth: Payer: Self-pay | Admitting: Nurse Practitioner

## 2022-07-14 NOTE — Telephone Encounter (Signed)
Denyse Amass from the [facility] Department called in and had some questions regarding the patients HIV test. He can be reached at [phone]. Thank you!

## 2022-07-14 NOTE — Telephone Encounter (Signed)
Called and spoke to [place] and gave him the information he was asking for.

## 2022-07-16 ENCOUNTER — Encounter: Payer: Medicare Other | Attending: Physician Assistant | Admitting: Physician Assistant

## 2022-07-16 ENCOUNTER — Ambulatory Visit: Payer: Medicare Other | Admitting: Pharmacist

## 2022-07-16 ENCOUNTER — Ambulatory Visit: Payer: Medicare Other | Admitting: Infectious Diseases

## 2022-07-16 DIAGNOSIS — L89613 Pressure ulcer of right heel, stage 3: Secondary | ICD-10-CM | POA: Diagnosis present

## 2022-07-16 DIAGNOSIS — W230XXA Caught, crushed, jammed, or pinched between moving objects, initial encounter: Secondary | ICD-10-CM | POA: Diagnosis not present

## 2022-07-16 DIAGNOSIS — F17218 Nicotine dependence, cigarettes, with other nicotine-induced disorders: Secondary | ICD-10-CM | POA: Diagnosis not present

## 2022-07-16 DIAGNOSIS — J449 Chronic obstructive pulmonary disease, unspecified: Secondary | ICD-10-CM | POA: Diagnosis not present

## 2022-07-16 DIAGNOSIS — Z8673 Personal history of transient ischemic attack (TIA), and cerebral infarction without residual deficits: Secondary | ICD-10-CM | POA: Insufficient documentation

## 2022-07-16 NOTE — Progress Notes (Signed)
Molly, Dennis ([id number]) [id number] Page 1 of 10 Visit Report for 07/16/2022 Arrival Information Details Patient Name: Date of Service: Molly Dennis, Molly Dennis 07/16/2022 2:30 PM Medical Record Number: [id number] Patient Account Number: [id number] Date of Birth/Sex: Treating RN: [birth year] (51 y.o. [name] Primary Care Florentino Laabs: Karl Ito Other Clinician: Referring Rosland Riding: Treating Denym Rahimi/Extender: Elmer Ramp in Treatment: 4 Visit Information History Since Last Visit Added or deleted any medications: No Patient Arrived: Ambulatory Any new allergies or adverse reactions: No Arrival Time: 14:47 Had a fall or experienced change in No Accompanied By: Friend activities of daily living that may affect Transfer Assistance: None risk of falls: Patient Identification Verified: Yes Signs or symptoms of abuse/neglect since last visito No Secondary Verification Process Completed: Yes Hospitalized since last visit: No Patient Requires Transmission-Based Precautions: No Implantable device outside of the clinic excluding No Patient Has Alerts: Yes cellular tissue based products placed in the center Patient Alerts: ABI R 1.29 since last visit: Pain Present Now: No Electronic Signature(s) Signed: 07/16/2022 5:27:53 PM By: Rosalio Loud MSN RN CNS WTA Entered By: Rosalio Loud on 07/16/2022 15:21:52 -------------------------------------------------------------------------------- Clinic Level of Care Assessment Details Patient Name: Date of Service: Molly Dennis, Molly Dennis 07/16/2022 2:30 PM Medical Record Number: [id number] Patient Account Number: [id number] Date of Birth/Sex: Treating RN: [birth year] (51 y.o. [name] Primary Care Danton Palmateer: Karl Ito Other Clinician: Referring Chanele Douglas: Treating Anival Pasha/Extender: Elmer Ramp in Treatment: 4 Clinic Level of Care Assessment Items TOOL 1 Quantity Score _0  -  0 Use when EandM and Procedure is performed on INITIAL visit ASSESSMENTS - Nursing Assessment / Reassessment _1  - 0 General Physical Exam (combine w/ comprehensive assessment (listed just below) when performed on new pt. evals) _2  - 0 Comprehensive Assessment (HX, ROS, Risk Assessments, Wounds Hx, etc.) ASSESSMENTS - Wound and Skin Assessment / Reassessment Dennis, Molly Kluver ([id number]) [id number] Page 2 of 10 _3  - 0 Dermatologic / Skin Assessment (not related to wound area) ASSESSMENTS - Ostomy and/or Continence Assessment and Care _4  - 0 Incontinence Assessment and Management _5  - 0 Ostomy Care Assessment and Management (repouching, etc.) PROCESS - Coordination of Care _6  - 0 Simple Patient / Family Education for ongoing care _7  - 0 Complex (extensive) Patient / Family Education for ongoing care _8  - 0 Staff obtains Programmer, systems, Records, T Results / Process Orders est _9  - 0 Staff telephones HHA, Nursing Homes / Clarify orders / etc _10  - 0 Routine Transfer to another Facility (non-emergent condition) _11  - 0 Routine Hospital Admission (non-emergent condition) _12  - 0 New Admissions / Biomedical engineer / Ordering NPWT Apligraf, etc. , _13  - 0 Emergency Hospital Admission (emergent condition) PROCESS - Special Needs _14  - 0 Pediatric / Minor Patient Management _15  - 0 Isolation Patient Management _16  - 0 Hearing / Language / Visual special needs _17  - 0 Assessment of Community assistance (transportation, D/C planning, etc.) _18  - 0 Additional assistance / Altered mentation _19  - 0 Support Surface(s) Assessment (bed, cushion, seat, etc.) INTERVENTIONS - Miscellaneous _20  - 0 External ear exam _21  - 0 Patient Transfer (multiple staff / Civil Service fast streamer / Similar devices) _22  - 0 Simple Staple / Suture removal (25 or less) _23  - 0 Complex Staple / Suture removal (26 or more) _24  - 0 Hypo/Hyperglycemic Management (do not check if billed separately) _25   - 0 Ankle / Brachial Index (ABI) - do not check if billed separately Has the patient been seen at the hospital within the last three years: Yes Total  Score: 0 Level Of Care: ____ Electronic Signature(s) Signed: 07/16/2022 5:27:53 PM By: Rosalio Loud MSN RN CNS WTA Entered By: Rosalio Loud on 07/16/2022 15:19:38 -------------------------------------------------------------------------------- Encounter Discharge Information Details Patient Name: Date of Service: Molly Dennis, Molly Dennis 07/16/2022 2:30 PM Medical Record Number: [id number] Patient Account Number: [id number] Date of Birth/Sex: Treating RN: Jun 09, 1971 (51 y.o. [name] Primary Care Minnah Llamas: Karl Ito Other Clinician: Referring Sarabeth Benton: Treating Daimon Kean/Extender: Elmer Ramp in Treatment: 4 [place], Molly Kluver ([id number]) [id number] Page 3 of 10 Encounter Discharge Information Items Post Procedure Vitals Discharge Condition: Stable Temperature (F): 97.8 Ambulatory Status: Ambulatory Pulse (bpm): 109 Discharge Destination: Home Respiratory Rate (breaths/min): 16 Transportation: Private Auto Blood Pressure (mmHg): 104/72 Accompanied By: Denman George Schedule Follow-up Appointment: Yes Clinical Summary of Care: Electronic Signature(s) Signed: 07/16/2022 5:27:53 PM By: Rosalio Loud MSN RN CNS WTA Entered By: Rosalio Loud on 07/16/2022 15:22:12 -------------------------------------------------------------------------------- Lower Extremity Assessment Details Patient Name: Date of Service: Molly Dennis, Molly Dennis 07/16/2022 2:30 PM Medical Record Number: [id number] Patient Account Number: [id number] Date of Birth/Sex: Treating RN: 25-Dec-1970 (51 y.o. [name] Primary Care Imelda Dandridge: Karl Ito Other Clinician: Referring Lyrika Souders: Treating Spencer Peterkin/Extender: Elmer Ramp in Treatment: 4 Electronic Signature(s) Signed: 07/16/2022 5:27:53 PM By: Rosalio Loud MSN RN CNS WTA Entered By: Rosalio Loud on 07/16/2022 14:59:10 -------------------------------------------------------------------------------- Multi Wound Chart Details Patient Name: Date of Service: Molly Dennis 07/16/2022 2:30 PM Medical Record Number: [id number] Patient Account Number: [id number] Date of Birth/Sex: Treating RN: 1971-07-03 (51 y.o. [name] Primary Care Zaccheus Edmister: Karl Ito Other Clinician: Referring Teniqua Marron: Treating Tinnie Kunin/Extender: Elmer Ramp in Treatment: 4 Vital Signs Height(in): 65 Pulse(bpm): 109 Weight(lbs): 170 Blood Pressure(mmHg): 104/72 Body Mass Index(BMI): 28.3 Temperature(F): 97.8 Respiratory Rate(breaths/min): 18 [1:Photos:] [N/A:N/A [id number] Page 4 of 10] Right, Distal, Posterior Calcaneus Right, Proximal, Posterior Calcaneus N/A Wound Location: Trauma Trauma N/A Wounding Event: Pressure Ulcer Pressure Ulcer N/A Primary Etiology: Asthma, Chronic Obstructive Asthma, Chronic Obstructive N/A Comorbid History: Pulmonary Disease (COPD) Pulmonary Disease (COPD) 02/14/2021 02/14/2021 N/A Date Acquired: 4 4 N/A Weeks of Treatment: Open Open N/A Wound Status: No No N/A Wound Recurrence: 0.8x1.3x0.1 1x0.3x0.1 N/A Measurements L x W x D (cm) 0.817 0.236 N/A A (cm) : rea 0.082 0.024 N/A Volume (cm) : 62.80% 16.60% N/A % Reduction in A rea: 62.70% 14.30% N/A % Reduction in Volume: Category/Stage III Category/Stage III N/A Classification: Medium Small N/A Exudate A mount: Serosanguineous Serosanguineous N/A Exudate Type: red, brown red, brown N/A Exudate Color: Distinct, outline attached Distinct, outline attached N/A Wound Margin: Large (67-100%) Large (67-100%) N/A Granulation A mount: Pink Pink N/A Granulation Quality: Small (1-33%) Small (1-33%) N/A Necrotic A mount: Fat Layer (Subcutaneous Tissue): Yes Fat Layer (Subcutaneous Tissue): Yes N/A Exposed  Structures: Fascia: No Fascia: No Tendon: No Tendon: No Muscle: No Muscle: No Joint: No Joint: No Bone: No Bone: No Small (1-33%) Small (1-33%) N/A Epithelialization: Treatment Notes Electronic Signature(s) Signed: 07/16/2022 5:27:53 PM By: Rosalio Loud MSN RN CNS WTA Entered By: Rosalio Loud on 07/16/2022 14:59:17 -------------------------------------------------------------------------------- Multi-Disciplinary Care Plan Details Patient Name: Date of Service: Molly Dennis 07/16/2022 2:30 PM Medical Record Number: [id number] Patient Account Number: [id number] Date of Birth/Sex: Treating RN: [birth year] (51 y.o. [name] Primary Care Raphel Stickles: Karl Ito Other Clinician: Referring Kerby Hockley: Treating Takeria Marquina/Extender: Elmer Ramp in Treatment: 4 Active Inactive Necrotic Tissue Nursing Diagnoses: Impaired tissue integrity related to necrotic/devitalized tissue Knowledge deficit related to management of necrotic/devitalized tissue Goals: Helaine Chess ([id number])  [id number].pdf Page 5 of 10 Necrotic/devitalized tissue will be minimized in the wound bed Date Initiated: 06/16/2022 Target Resolution Date: 07/15/2022 Goal Status: Active Patient/caregiver will verbalize understanding of reason and process for debridement of necrotic tissue Date Initiated: 06/16/2022 Target Resolution Date: 07/15/2022 Goal Status: Active Interventions: Assess patient pain level pre-, during and post procedure and prior to discharge Provide education on necrotic tissue and debridement process Treatment Activities: Apply topical anesthetic as ordered : 06/15/2022 Excisional debridement : 06/15/2022 Notes: Orientation to the Wound Care Program Nursing Diagnoses: Knowledge deficit related to the wound healing center program Goals: Patient/caregiver will verbalize understanding of the [place] Date Initiated:  06/16/2022 Target Resolution Date: 07/11/2022 Goal Status: Active Interventions: Provide education on orientation to the wound center Notes: Wound/Skin Impairment Nursing Diagnoses: Impaired tissue integrity Knowledge deficit related to smoking impact on wound healing Knowledge deficit related to ulceration/compromised skin integrity Goals: Patient will demonstrate a reduced rate of smoking or cessation of smoking Date Initiated: 06/16/2022 Target Resolution Date: 07/15/2022 Goal Status: Active Patient will have a decrease in wound volume by X% from date: (specify in notes) Date Initiated: 06/16/2022 Target Resolution Date: 07/15/2022 Goal Status: Active Patient/caregiver will verbalize understanding of skin care regimen Date Initiated: 06/16/2022 Target Resolution Date: 07/15/2022 Goal Status: Active Ulcer/skin breakdown will have a volume reduction of 30% by week 4 Date Initiated: 06/16/2022 Target Resolution Date: 07/15/2022 Goal Status: Active Ulcer/skin breakdown will have a volume reduction of 50% by week 8 Date Initiated: 06/16/2022 Target Resolution Date: 08/15/2022 Goal Status: Active Ulcer/skin breakdown will have a volume reduction of 80% by week 12 Date Initiated: 06/16/2022 Target Resolution Date: 09/15/2022 Goal Status: Active Ulcer/skin breakdown will heal within 14 weeks Date Initiated: 06/16/2022 Target Resolution Date: 09/29/2022 Goal Status: Active Interventions: Assess patient/caregiver ability to obtain necessary supplies Assess patient/caregiver ability to perform ulcer/skin care regimen upon admission and as needed Assess ulceration(s) every visit Provide education on smoking Provide education on ulcer and skin care Treatment Activities: Patient referred to home care : 06/15/2022 Referred to DME Ranulfo Kall for dressing supplies : 06/15/2022 Skin care regimen initiated : 06/15/2022 Smoking cessation education : 06/15/2022 Topical wound management  initiated : 06/15/2022 Notes: ARLITA, BUFFKIN ([id number]) [phone].pdf Page 6 of 10 Electronic Signature(s) Signed: 07/16/2022 5:27:53 PM By: Rosalio Loud MSN RN CNS WTA Entered By: Rosalio Loud on 07/16/2022 15:20:06 -------------------------------------------------------------------------------- Pain Assessment Details Patient Name: Date of Service: Molly Dennis, Molly Dennis 07/16/2022 2:30 PM Medical Record Number: [id number] Patient Account Number: [id number] Date of Birth/Sex: Treating RN: Apr 24, 1971 (51 y.o. [name] Primary Care Anaily Ashbaugh: Karl Ito Other Clinician: Referring Lemon Whitacre: Treating Demarco Bacci/Extender: Elmer Ramp in Treatment: 4 Active Problems Location of Pain Severity and Description of Pain Patient Has Paino No Site Locations Pain Management and Medication Current Pain Management: Electronic Signature(s) Signed: 07/16/2022 5:27:53 PM By: Rosalio Loud MSN RN CNS WTA Entered By: Rosalio Loud on 07/16/2022 14:50:41 Patient/Caregiver Education Details -------------------------------------------------------------------------------- Helaine Chess ([id number]) [id number] Page 7 of 10 Patient Name: Date of Service: Molly Dennis, Molly Dennis 12/14/2023andnbsp2:30 PM Medical Record Number: [id number] Patient Account Number: [id number] Date of Birth/Gender: Treating RN: 10/13/1970 (51 y.o. [name] Primary Care Physician: Karl Ito Other Clinician: Referring Physician: Treating Physician/Extender: Elmer Ramp in Treatment: 4 Education Assessment Education Provided To: Patient Education Topics Provided Wound Debridement: Handouts: Wound Debridement Methods: Explain/Verbal Responses: State content correctly Electronic Signature(s) Signed: 07/16/2022 5:27:53 PM By: Rosalio Loud MSN RN CNS WTA Entered By: Rosalio Loud on 07/16/2022  15:20:01 -------------------------------------------------------------------------------- Wound Assessment Details Patient Name: Date of Service: Molly Dennis, Molly Dennis 07/16/2022 2:30 PM Medical Record Number: [id number] Patient Account Number: [id number] Date of Birth/Sex: Treating RN: October 16, 1970 (51 y.o. [name] Primary Care Shatoya Roets: Karl Ito Other Clinician: Referring Inola Lisle: Treating India Jolin/Extender: Elmer Ramp in Treatment: 4 Wound Status Wound Number: 1 Primary Etiology: Pressure Ulcer Wound Location: Right, Distal, Posterior Calcaneus Wound Status: Open Wounding Event: Trauma Comorbid History: Asthma, Chronic Obstructive Pulmonary Disease (COPD) Date Acquired: 02/14/2021 Weeks Of Treatment: 4 Clustered Wound: No Photos Wound Measurements Length: (cm) 0.8 Width: (cm) 1.3 Depth: (cm) 0.1 Dennis, Sakira ([id number]) Area: (cm) 0.817 Volume: (cm) 0.082 % Reduction in Area: 62.8% % Reduction in Volume: 62.7% Epithelialization: Small (1-33%) [id number] Page 8 of 10 Wound Description Classification: Category/Stage III Wound Margin: Distinct, outline attached Exudate Amount: Medium Exudate Type: Serosanguineous Exudate Color: red, brown Foul Odor After Cleansing: No Slough/Fibrino No Wound Bed Granulation Amount: Large (67-100%) Exposed Structure Granulation Quality: Pink Fascia Exposed: No Necrotic Amount: Small (1-33%) Fat Layer (Subcutaneous Tissue) Exposed: Yes Necrotic Quality: Adherent Slough Tendon Exposed: No Muscle Exposed: No Joint Exposed: No Bone Exposed: No Treatment Notes Wound #1 (Calcaneus) Wound Laterality: Right, Posterior, Distal Cleanser Soap and Water Discharge Instruction: Gently cleanse wound with antibacterial soap, rinse and pat dry prior to dressing wounds Peri-Wound Care Topical Primary Dressing Hydrofera Blue Ready Transfer Foam, 2.5x2.5 (in/in) Discharge Instruction:  Apply Hydrofera Blue Ready to wound bed as directed Secondary Dressing ABD Pad 5x9 (in/in) Discharge Instruction: Cover with ABD pad Secured With Kerlix Roll Sterile or Non-Sterile 6-ply 4.5x4 (yd/yd) Discharge Instruction: Apply Kerlix as directed Compression Wrap Compression Stockings Add-Ons Electronic Signature(s) Signed: 07/16/2022 5:27:53 PM By: Rosalio Loud MSN RN CNS WTA Entered By: Rosalio Loud on 07/16/2022 14:57:39 -------------------------------------------------------------------------------- Wound Assessment Details Patient Name: Date of Service: Molly Dennis, Molly Dennis 07/16/2022 2:30 PM Medical Record Number: [id number] Patient Account Number: [id number] Date of Birth/Sex: Treating RN: April 21, 1971 (51 y.o. [name] Primary Care Kason Benak: Karl Ito Other Clinician: Referring Izic Stfort: Treating Ahmia Colford/Extender: Elmer Ramp in Treatment: 4 [place], Molly Kluver ([id number]) [id number] Page 9 of 10 Wound Status Wound Number: 2 Primary Etiology: Pressure Ulcer Wound Location: Right, Proximal, Posterior Calcaneus Wound Status: Open Wounding Event: Trauma Comorbid History: Asthma, Chronic Obstructive Pulmonary Disease (COPD) Date Acquired: 02/14/2021 Weeks Of Treatment: 4 Clustered Wound: No Photos Wound Measurements Length: (cm) 1 Width: (cm) 0.3 Depth: (cm) 0.1 Area: (cm) 0.236 Volume: (cm) 0.024 % Reduction in Area: 16.6% % Reduction in Volume: 14.3% Epithelialization: Small (1-33%) Wound Description Classification: Category/Stage III Wound Margin: Distinct, outline attached Exudate Amount: Small Exudate Type: Serosanguineous Exudate Color: red, brown Foul Odor After Cleansing: No Slough/Fibrino No Wound Bed Granulation Amount: Large (67-100%) Exposed Structure Granulation Quality: Pink Fascia Exposed: No Necrotic Amount: Small (1-33%) Fat Layer (Subcutaneous Tissue) Exposed: Yes Necrotic Quality:  Adherent Slough Tendon Exposed: No Muscle Exposed: No Joint Exposed: No Bone Exposed: No Treatment Notes Wound #2 (Calcaneus) Wound Laterality: Right, Posterior, Proximal Cleanser [employer]ram Ancillary Kit - 15 Day Supply Discharge Instruction: Use supplies as instructed; Kit contains: (15) Saline Bullets; (15) 3x3 Gauze; 15 pr Gloves Soap and Water Discharge Instruction: Gently cleanse wound with antibacterial soap, rinse and pat dry prior to dressing wounds Peri-Wound Care Topical Primary Dressing Hydrofera Blue Ready Transfer Foam, 2.5x2.5 (in/in) Discharge Instruction: Apply Hydrofera Blue Ready to wound bed as directed Secondary Dressing ABD Pad 5x9 (in/in) Discharge Instruction: Cover with ABD pad Secured With [employer] or  Non-Sterile 6-ply 4.5x4 (yd/yd) Discharge Instruction: Apply Kerlix as directed Compression Wrap Compression Stockings Dennis, Molly Kluver ([id number]) [phone].pdf Page 10 of 10 Add-Ons Electronic Signature(s) Signed: 07/16/2022 5:27:53 PM By: Rosalio Loud MSN RN CNS WTA Entered By: Rosalio Loud on 07/16/2022 14:58:08 -------------------------------------------------------------------------------- Vitals Details Patient Name: Date of Service: Molly Dennis, Molly Dennis 07/16/2022 2:30 PM Medical Record Number: [id number] Patient Account Number: [id number] Date of Birth/Sex: Treating RN: [birth year] (51 y.o. [name] Primary Care Matia Zelada: Karl Ito Other Clinician: Referring Beaux Wedemeyer: Treating Barrett Holthaus/Extender: Elmer Ramp in Treatment: 4 Vital Signs Time Taken: 14:50 Temperature (F): 97.8 Height (in): 65 Pulse (bpm): 109 Weight (lbs): 170 Respiratory Rate (breaths/min): 18 Body Mass Index (BMI): 28.3 Blood Pressure (mmHg): 104/72 Reference Range: 80 - 120 mg / dl Electronic Signature(s) Signed: 07/16/2022 5:27:53 PM By: Rosalio Loud MSN RN CNS WTA Entered By: Rosalio Loud on 07/16/2022  14:50:34
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
## 2022-07-22 ENCOUNTER — Ambulatory Visit: Payer: Medicare Other | Admitting: Pharmacist

## 2022-07-22 ENCOUNTER — Ambulatory Visit: Payer: Medicare Other | Admitting: Infectious Diseases

## 2022-07-22 ENCOUNTER — Telehealth: Payer: Self-pay | Admitting: Nurse Practitioner

## 2022-07-22 NOTE — Telephone Encounter (Signed)
Looks like the group I referred her to does have an office in [place]. She needs to call them back and see if they have the office in [place]

## 2022-07-22 NOTE — Telephone Encounter (Signed)
Patient called and asked if there is a infectious diease in [place] she can go for a referral. Call back number [phone].

## 2022-07-23 ENCOUNTER — Encounter: Payer: Self-pay | Admitting: Physician Assistant

## 2022-07-23 ENCOUNTER — Ambulatory Visit (INDEPENDENT_AMBULATORY_CARE_PROVIDER_SITE_OTHER): Payer: Medicare Other | Admitting: Physician Assistant

## 2022-07-23 ENCOUNTER — Other Ambulatory Visit: Payer: Self-pay | Admitting: Nurse Practitioner

## 2022-07-23 ENCOUNTER — Ambulatory Visit: Payer: Medicare Other | Admitting: Pharmacist

## 2022-07-23 ENCOUNTER — Other Ambulatory Visit: Payer: Self-pay

## 2022-07-23 VITALS — BP 112/75 | HR 99 | Temp 97.6°F | Resp 18 | Wt 138.0 lb

## 2022-07-23 DIAGNOSIS — B20 Human immunodeficiency virus [HIV] disease: Secondary | ICD-10-CM

## 2022-07-23 DIAGNOSIS — Z79899 Other long term (current) drug therapy: Secondary | ICD-10-CM

## 2022-07-23 DIAGNOSIS — Z113 Encounter for screening for infections with a predominantly sexual mode of transmission: Secondary | ICD-10-CM | POA: Diagnosis not present

## 2022-07-23 DIAGNOSIS — Z8673 Personal history of transient ischemic attack (TIA), and cerebral infarction without residual deficits: Secondary | ICD-10-CM

## 2022-07-23 DIAGNOSIS — Z23 Encounter for immunization: Secondary | ICD-10-CM

## 2022-07-23 MED ORDER — BICTEGRAVIR-EMTRICITAB-TENOFOV 50-200-25 MG PO TABS: 1.0000 | ORAL_TABLET | Freq: Every day | ORAL | 0 refills | Status: DC
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
## 2022-07-23 NOTE — Patient Instructions (Signed)
Hi Molly Dennis! So nice meeting you today and your niece and boyfriend, it is wonderful you have so much support and love surrounding you :) Dental clinic referral today Appt made with [facility] in 4 weeks Flu vaccine today Samples of Biktarvy provided today We will send future refills to your delivery pharmacy For now until your HIV is suppressed avoid sharing tooth brushes, razors, engaging in sexual activity, I have provided you with condoms today [facility] is another resource,however they are not available in the clinic, your case worker can set up housing  When you follow up with your primary care provider you need to set up health maintenance such as pap smear, colonoscopy, mammogram Labs today Start reducing crack and tobacco use
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
## 2022-07-23 NOTE — Telephone Encounter (Signed)
Called pt back to relay information,  and she stated that she did not need anything from [place].

## 2022-07-24 ENCOUNTER — Other Ambulatory Visit (HOSPITAL_COMMUNITY): Payer: Self-pay

## 2022-07-24 LAB — T-HELPER CELL (CD4) - (RCID CLINIC ONLY)
CD4 % Helper T Cell: 40 % (ref 33–65)
CD4 T Cell Abs: 654 /uL (ref 400–1790)

## 2022-07-28 ENCOUNTER — Telehealth: Payer: Self-pay | Admitting: Nurse Practitioner

## 2022-07-28 NOTE — Telephone Encounter (Signed)
  Encourage patient to contact the pharmacy for refills or they can request refills through [facility]  Did the patient contact the pharmacy: No  LAST APPOINTMENT DATE: 07/08/2022  NEXT APPOINTMENT DATE: 10/07/2022  MEDICATION: Susanne Borders  Is the patient out of medication? No  If not, how much is left? Enough until Thursday  PHARMACY:  [facility] - [place], [place] - [address]   Comment: Patient stated that the office that prescribes this is close. She needs to take the medication daily. She would like a call on what to do.   Let patient know to contact pharmacy at the end of the day to make sure medication is ready.  Please notify patient to allow 48-72 hours to process

## 2022-07-29 ENCOUNTER — Other Ambulatory Visit: Payer: Self-pay

## 2022-07-29 ENCOUNTER — Other Ambulatory Visit (HOSPITAL_COMMUNITY): Payer: Self-pay

## 2022-07-29 DIAGNOSIS — B20 Human immunodeficiency virus [HIV] disease: Secondary | ICD-10-CM

## 2022-07-29 MED ORDER — BICTEGRAVIR-EMTRICITAB-TENOFOV 50-200-25 MG PO TABS: 1.0000 | ORAL_TABLET | Freq: Every day | ORAL | 0 refills | Status: DC

## 2022-07-30 ENCOUNTER — Encounter: Payer: Medicare Other | Admitting: Internal Medicine

## 2022-07-30 DIAGNOSIS — L89613 Pressure ulcer of right heel, stage 3: Secondary | ICD-10-CM | POA: Diagnosis not present

## 2022-08-05 LAB — CBC WITH DIFFERENTIAL/PLATELET
Absolute Monocytes: 563 cells/uL (ref 200–950)
Basophils Absolute: 26 cells/uL (ref 0–200)
Basophils Relative: 0.3 %
Eosinophils Absolute: 194 cells/uL (ref 15–500)
Eosinophils Relative: 2.2 %
HCT: 35.8 % (ref 35.0–45.0)
Hemoglobin: 12.4 g/dL (ref 11.7–15.5)
Lymphs Abs: 1822 cells/uL (ref 850–3900)
MCH: 29.2 pg (ref 27.0–33.0)
MCHC: 34.6 g/dL (ref 32.0–36.0)
MCV: 84.4 fL (ref 80.0–100.0)
MPV: 9.8 fL (ref 7.5–12.5)
Monocytes Relative: 6.4 %
Neutro Abs: 6195 cells/uL (ref 1500–7800)
Neutrophils Relative %: 70.4 %
Platelets: 315 10*3/uL (ref 140–400)
RBC: 4.24 10*6/uL (ref 3.80–5.10)
RDW: 13.5 % (ref 11.0–15.0)
Total Lymphocyte: 20.7 %
WBC: 8.8 10*3/uL (ref 3.8–10.8)

## 2022-08-05 LAB — HEPATITIS A ANTIBODY, TOTAL: Hepatitis A AB,Total: REACTIVE — AB

## 2022-08-05 LAB — COMPLETE METABOLIC PANEL WITH GFR
AG Ratio: 0.9 (calc) — ABNORMAL LOW (ref 1.0–2.5)
ALT: 6 U/L (ref 6–29)
AST: 10 U/L (ref 10–35)
Albumin: 3.3 g/dL — ABNORMAL LOW (ref 3.6–5.1)
Alkaline phosphatase (APISO): 90 U/L (ref 37–153)
BUN: 13 mg/dL (ref 7–25)
CO2: 26 mmol/L (ref 20–32)
Calcium: 9.4 mg/dL (ref 8.6–10.4)
Chloride: 100 mmol/L (ref 98–110)
Creat: 0.81 mg/dL (ref 0.50–1.03)
Globulin: 3.6 g/dL (calc) (ref 1.9–3.7)
Glucose, Bld: 63 mg/dL — ABNORMAL LOW (ref 65–99)
Potassium: 3.3 mmol/L — ABNORMAL LOW (ref 3.5–5.3)
Sodium: 138 mmol/L (ref 135–146)
Total Bilirubin: 0.2 mg/dL (ref 0.2–1.2)
Total Protein: 6.9 g/dL (ref 6.1–8.1)
eGFR: 87 mL/min/{1.73_m2} (ref >=60)

## 2022-08-05 LAB — LIPID PANEL
Cholesterol: 187 mg/dL (ref <200)
HDL: 71 mg/dL (ref >=50)
LDL Cholesterol (Calc): 96 mg/dL (calc)
Non-HDL Cholesterol (Calc): 116 mg/dL (calc) (ref <130)
Total CHOL/HDL Ratio: 2.6 (calc) (ref <5.0)
Triglycerides: 102 mg/dL (ref <150)

## 2022-08-05 LAB — HLA B*5701: HLA-B*5701 w/rflx HLA-B High: NEGATIVE

## 2022-08-05 LAB — HIV ANTIBODY (ROUTINE TESTING W REFLEX): HIV 1&2 Ab, 4th Generation: REACTIVE — AB

## 2022-08-05 LAB — HIV-1/2 AB - DIFFERENTIATION
HIV-1 antibody: POSITIVE — AB
HIV-2 Ab: NEGATIVE

## 2022-08-05 LAB — HEPATITIS B SURFACE ANTIGEN: Hepatitis B Surface Ag: NONREACTIVE

## 2022-08-05 LAB — HEPATITIS C ANTIBODY: Hepatitis C Ab: NONREACTIVE

## 2022-08-05 LAB — RPR: RPR Ser Ql: NONREACTIVE

## 2022-08-05 LAB — HEPATITIS B CORE ANTIBODY, TOTAL: Hep B Core Total Ab: NONREACTIVE

## 2022-08-05 LAB — HIV-1 RNA ULTRAQUANT REFLEX TO GENTYP+
HIV 1 RNA Quant: 194000 copies/mL — ABNORMAL HIGH
HIV-1 RNA Quant, Log: 5.29 Log copies/mL — ABNORMAL HIGH

## 2022-08-05 LAB — HEPATITIS B SURFACE ANTIBODY,QUALITATIVE: Hep B S Ab: NONREACTIVE

## 2022-08-05 LAB — QUANTIFERON-TB GOLD PLUS
Mitogen-NIL: >10 IU/mL
NIL: 0.04 IU/mL
QuantiFERON-TB Gold Plus: NEGATIVE
TB1-NIL: 0.11 IU/mL
TB2-NIL: 0.11 IU/mL

## 2022-08-05 LAB — HIV-1 GENOTYPE: HIV-1 Genotype: DETECTED — AB

## 2022-08-05 NOTE — Progress Notes (Signed)
TICHINA, KOEBEL ([id number]) [id number] Page 1 of 11 Visit Report for 07/30/2022 Arrival Information Details Patient Name: Date of Service: Molly Dennis, Molly Dennis 07/30/2022 3:15 PM Medical Record Number: [id number] Patient Account Number: [id number] Date of Birth/Sex: Treating RN: 01/28/1971 (52 y.o. [name] Primary Care Ellias Mcelreath: Karl Ito Other Clinician: Referring Jheremy Boger: Treating Donique Hammonds/Extender: RO BSO N, MICHA EL Ferd Glassing in Treatment: 6 Visit Information History Since Last Visit All ordered tests and consults were completed: No Patient Arrived: Ambulatory Added or deleted any medications: No Arrival Time: 15:27 Any new allergies or adverse reactions: No Transfer Assistance: None Had a fall or experienced change in No Patient Identification Verified: Yes activities of daily living that may affect Secondary Verification Process Completed: Yes risk of falls: Patient Requires Transmission-Based Precautions: No Signs or symptoms of abuse/neglect since last visito No Patient Has Alerts: Yes Hospitalized since last visit: No Patient Alerts: ABI R 1.29 Implantable device outside of the clinic excluding No cellular tissue based products placed in the center since last visit: Has Dressing in Place as Prescribed: Yes Pain Present Now: Yes Electronic Signature(s) Signed: 08/05/2022 2:03:30 PM By: Massie Kluver Entered By: Massie Kluver on 07/30/2022 15:31:19 -------------------------------------------------------------------------------- Clinic Level of Care Assessment Details Patient Name: Date of Service: Molly Dennis, Molly Dennis 07/30/2022 3:15 PM Medical Record Number: [id number] Patient Account Number: [id number] Date of Birth/Sex: Treating RN: July 24, 1971 (52 y.o. [name] Primary Care Jeff Frieden: Karl Ito Other Clinician: Massie Kluver Referring Caelum Federici: Treating Fernie Grimm/Extender: RO BSO N, MICHA EL Ferd Glassing in Treatment: 6 Clinic Level of Care Assessment Items TOOL 4 Quantity Score _0  - 0 Use when only an EandM is performed on FOLLOW-UP visit ASSESSMENTS - Nursing Assessment / Reassessment X- 1 10 Reassessment of Co-morbidities (includes updates in patient status) X- 1 5 Reassessment of Adherence to Treatment Plan [name], [name] ([id number]) [id number] Page 2 of 11 ASSESSMENTS - Wound and Skin A ssessment / Reassessment _1  - 0 Simple Wound Assessment / Reassessment - one wound X- 2 5 Complex Wound Assessment / Reassessment - multiple wounds _2  - 0 Dermatologic / Skin Assessment (not related to wound area) ASSESSMENTS - Focused Assessment _3  - 0 Circumferential Edema Measurements - multi extremities _4  - 0 Nutritional Assessment / Counseling / Intervention _5  - 0 Lower Extremity Assessment (monofilament, tuning fork, pulses) _6  - 0 Peripheral Arterial Disease Assessment (using hand held doppler) ASSESSMENTS - Ostomy and/or Continence Assessment and Care _7  - 0 Incontinence Assessment and Management _8  - 0 Ostomy Care Assessment and Management (repouching, etc.) PROCESS - Coordination of Care X - Simple Patient / Family Education for ongoing care 1 15 _9  - 0 Complex (extensive) Patient / Family Education for ongoing care _10  - 0 Staff obtains Programmer, systems, Records, T Results / Process Orders est _11  - 0 Staff telephones HHA, Nursing Homes / Clarify orders / etc _12  - 0 Routine Transfer to another Facility (non-emergent condition) _13  - 0 Routine Hospital Admission (non-emergent condition) _14  - 0 New Admissions / Biomedical engineer / Ordering NPWT Apligraf, etc. , _15  - 0 Emergency Hospital Admission (emergent condition) X- 1 10 Simple Discharge Coordination _16  - 0 Complex (extensive) Discharge Coordination PROCESS - Special Needs _17  - 0 Pediatric / Minor Patient Management _18  - 0 Isolation Patient Management _19  - 0 Hearing /  Language / Visual special needs _20  - 0 Assessment of Community assistance (transportation, D/C planning, etc.) _21  - 0 Additional assistance / Altered mentation _22  - 0 Support Surface(s) Assessment (bed, cushion,  seat, etc.) INTERVENTIONS - Wound Cleansing / Measurement _0  - 0 Simple Wound Cleansing - one wound X- 2 5 Complex Wound Cleansing - multiple wounds X- 1 5 Wound Imaging (photographs - any number of wounds) _1  - 0 Wound Tracing (instead of photographs) _2  - 0 Simple Wound Measurement - one wound X- 2 5 Complex Wound Measurement - multiple wounds INTERVENTIONS - Wound Dressings _3  - 0 Small Wound Dressing one or multiple wounds X- 2 15 Medium Wound Dressing one or multiple wounds _4  - 0 Large Wound Dressing one or multiple wounds <GBTDVVOHYWVPXTGG>_2<\/IRSWNIOEVOJJKKXF>_8  - 0 Application of Medications - topical <HWEXHBZJIRCVELFY>_1<\/OFBPZWCHENIDPOEU>_2  - 0 Application of Medications - injection INTERVENTIONS - Miscellaneous _7  - 0 External ear exam [name], [name] ([id number]) [id number] Page 3 of 11 _8  - 0 Specimen Collection (cultures, biopsies, blood, body fluids, etc.) _9  - 0 Specimen(s) / Culture(s) sent or taken to Lab for analysis _10  - 0 Patient Transfer (multiple staff / Harrel Lemon Lift / Similar devices) _11  - 0 Simple Staple / Suture removal (25 or less) _12  - 0 Complex Staple / Suture removal (26 or more) _13  - 0 Hypo / Hyperglycemic Management (close monitor of Blood Glucose) _14  - 0 Ankle / Brachial Index (ABI) - do not check if billed separately X- 1 5 Vital Signs Has the patient been seen at the hospital within the last three years: Yes Total Score: 110 Level Of Care: New/Established - Level 3 Electronic Signature(s) Signed: 08/05/2022 2:03:30 PM By: Massie Kluver Entered By: Massie Kluver on 07/30/2022 16:05:59 -------------------------------------------------------------------------------- Encounter Discharge Information Details Patient Name: Date of Service: Molly Dennis, Molly Dennis 07/30/2022 3:15  PM Medical Record Number: [id number] Patient Account Number: [id number] Date of Birth/Sex: Treating RN: 02-Nov-1970 (52 y.o. [name] Primary Care Thayden Lemire: Karl Ito Other Clinician: Massie Kluver Referring Arran Fessel: Treating Hensley Treat/Extender: RO BSO N, MICHA EL Ferd Glassing in Treatment: 6 Encounter Discharge Information Items Discharge Condition: Stable Ambulatory Status: Ambulatory Discharge Destination: Home Transportation: Private Auto Accompanied By: sister Schedule Follow-up Appointment: Yes Clinical Summary of Care: Electronic Signature(s) Signed: 08/05/2022 2:03:30 PM By: Massie Kluver Entered By: Massie Kluver on 07/30/2022 16:21:05 Lower Extremity Assessment Details -------------------------------------------------------------------------------- Helaine Chess ([id number]) [id number] Page 4 of 11 Patient Name: Date of Service: Molly Dennis, Molly Dennis 07/30/2022 3:15 PM Medical Record Number: [id number] Patient Account Number: [id number] Date of Birth/Sex: Treating RN: 1971/04/14 (52 y.o. [name] Primary Care Laurella Tull: Karl Ito Other Clinician: Massie Kluver Referring Aaryn Parrilla: Treating Xayne Brumbaugh/Extender: RO BSO Delane Ginger, MICHA EL Ferd Glassing in Treatment: 6 Electronic Signature(s) Signed: 07/30/2022 4:56:13 PM By: Rosalio Loud MSN RN CNS WTA Signed: 08/05/2022 2:03:30 PM By: Massie Kluver Entered By: Massie Kluver on 07/30/2022 15:47:46 -------------------------------------------------------------------------------- Multi Wound Chart Details Patient Name: Date of Service: Molly Dennis, Molly Dennis 07/30/2022 3:15 PM Medical Record Number: [id number] Patient Account Number: [id number] Date of Birth/Sex: Treating RN: 1971-02-26 (52 y.o. [name] Primary Care Elowyn Raupp: Karl Ito Other Clinician: Massie Kluver Referring Terriann Difonzo: Treating Ege Muckey/Extender: RO BSO N, MICHA EL Ferd Glassing in  Treatment: 6 Vital Signs Height(in): 65 Pulse(bpm): 73 Weight(lbs): 170 Blood Pressure(mmHg): 105/74 Body Mass Index(BMI): 28.3 Temperature(F): 98.0 Respiratory Rate(breaths/min): 16 [1:Photos:] [N/A:N/A] Right, Distal, Posterior Calcaneus Right, Proximal, Posterior Calcaneus N/A Wound Location: Trauma Trauma N/A Wounding Event: Pressure Ulcer Pressure Ulcer N/A Primary Etiology: Asthma, Chronic Obstructive Asthma, Chronic Obstructive N/A Comorbid History: Pulmonary Disease (COPD) Pulmonary Disease (COPD) 02/14/2021 02/14/2021 N/A Date Acquired: 6 6 N/A Weeks of Treatment: Open Open N/A Wound Status: No No N/A  Wound Recurrence: 0.8x1.3x0.1 0.1x0.1x0.1 N/A Measurements L x W x D (cm) 0.817 0.008 N/A A (cm) : rea 0.082 0.001 N/A Volume (cm) : 62.80% 97.20% N/A % Reduction in A rea: 62.70% 96.40% N/A % Reduction in Volume: Category/Stage III Category/Stage III N/A Classification: Medium Small N/A Exudate A mount: Serosanguineous Serosanguineous N/A Exudate Type: red, brown red, brown N/A Exudate Color: Distinct, outline attached Distinct, outline attached N/A Wound Margin: Large (67-100%) Large (67-100%) N/A Granulation A mount: Pink Pink N/A Granulation Quality: Small (1-33%) Small (1-33%) N/A Necrotic A mount: Fat Layer (Subcutaneous Tissue): Yes Fat Layer (Subcutaneous Tissue): Yes N/A Exposed Structures: Fascia: No Fascia: No [name], Eupha ([id number]) [id number] Page 5 of 11 Tendon: No Tendon: No Muscle: No Muscle: No Joint: No Joint: No Bone: No Bone: No Small (1-33%) Small (1-33%) N/A Epithelialization: Treatment Notes Electronic Signature(s) Signed: 08/05/2022 2:03:30 PM By: Massie Kluver Entered By: Massie Kluver on 07/30/2022 15:47:50 -------------------------------------------------------------------------------- Multi-Disciplinary Care Plan Details Patient Name: Date of Service: Molly Dennis, Molly Dennis  07/30/2022 3:15 PM Medical Record Number: [id number] Patient Account Number: [id number] Date of Birth/Sex: Treating RN: [birth year] (52 y.o. [name] Primary Care Nery Kalisz: Karl Ito Other Clinician: Massie Kluver Referring Kinser Fellman: Treating Foye Haggart/Extender: RO BSO N, MICHA EL Ferd Glassing in Treatment: 6 Active Inactive Necrotic Tissue Nursing Diagnoses: Impaired tissue integrity related to necrotic/devitalized tissue Knowledge deficit related to management of necrotic/devitalized tissue Goals: Necrotic/devitalized tissue will be minimized in the wound bed Date Initiated: 06/16/2022 Target Resolution Date: 07/15/2022 Goal Status: Active Patient/caregiver will verbalize understanding of reason and process for debridement of necrotic tissue Date Initiated: 06/16/2022 Target Resolution Date: 07/15/2022 Goal Status: Active Interventions: Assess patient pain level pre-, during and post procedure and prior to discharge Provide education on necrotic tissue and debridement process Treatment Activities: Apply topical anesthetic as ordered : 06/15/2022 Excisional debridement : 06/15/2022 Notes: Orientation to the Wound Care Program Nursing Diagnoses: Knowledge deficit related to the wound healing center program Goals: Patient/caregiver will verbalize understanding of the [place] Date Initiated: 06/16/2022 Target Resolution Date: 07/11/2022 Goal Status: Active Interventions: Provide education on orientation to the wound center [place], [name] ([id number]) [id number] Page 6 of 11 Notes: Wound/Skin Impairment Nursing Diagnoses: Impaired tissue integrity Knowledge deficit related to smoking impact on wound healing Knowledge deficit related to ulceration/compromised skin integrity Goals: Patient will demonstrate a reduced rate of smoking or cessation of smoking Date Initiated: 06/16/2022 Target Resolution Date:  07/15/2022 Goal Status: Active Patient will have a decrease in wound volume by X% from date: (specify in notes) Date Initiated: 06/16/2022 Target Resolution Date: 07/15/2022 Goal Status: Active Patient/caregiver will verbalize understanding of skin care regimen Date Initiated: 06/16/2022 Target Resolution Date: 07/15/2022 Goal Status: Active Ulcer/skin breakdown will have a volume reduction of 30% by week 4 Date Initiated: 06/16/2022 Target Resolution Date: 07/15/2022 Goal Status: Active Ulcer/skin breakdown will have a volume reduction of 50% by week 8 Date Initiated: 06/16/2022 Target Resolution Date: 08/15/2022 Goal Status: Active Ulcer/skin breakdown will have a volume reduction of 80% by week 12 Date Initiated: 06/16/2022 Target Resolution Date: 09/15/2022 Goal Status: Active Ulcer/skin breakdown will heal within 14 weeks Date Initiated: 06/16/2022 Target Resolution Date: 09/29/2022 Goal Status: Active Interventions: Assess patient/caregiver ability to obtain necessary supplies Assess patient/caregiver ability to perform ulcer/skin care regimen upon admission and as needed Assess ulceration(s) every visit Provide education on smoking Provide education on ulcer and skin care Treatment Activities: Patient referred to home care : 06/15/2022 Referred to DME Demosthenes Virnig for dressing  supplies : 06/15/2022 Skin care regimen initiated : 06/15/2022 Smoking cessation education : 06/15/2022 Topical wound management initiated : 06/15/2022 Notes: Electronic Signature(s) Signed: 07/30/2022 4:56:13 PM By: Rosalio Loud MSN RN CNS WTA Signed: 08/05/2022 2:03:30 PM By: Massie Kluver Entered By: Massie Kluver on 07/30/2022 16:06:35 -------------------------------------------------------------------------------- Pain Assessment Details Patient Name: Date of Service: Molly Dennis, Molly Dennis 07/30/2022 3:15 PM Medical Record Number: [id number] Patient Account Number: [id number] Date of Birth/Sex:  Treating RN: January 25, 1971 (52 y.o. [name] Primary Care Glorimar Stroope: Karl Ito Other Clinician: Massie Kluver Referring Sarahelizabeth Conway: Treating Lerone Onder/Extender: Eldridge Dace, MICHA EL Ferd Glassing in Treatment: 6 [place], [name] ([id number]) [id number] Page 7 of 11 Active Problems Location of Pain Severity and Description of Pain Patient Has Paino Yes Site Locations Pain Location: Pain in Ulcers Duration of the Pain. Constant / Intermittento Intermittent Rate the pain. Current Pain Level: 7 Character of Pain Describe the Pain: Burning, Other: stinging, "it feels raw" Pain Management and Medication Current Pain Management: Medication: Yes Cold Application: No Rest: Yes Massage: No Activity: No T.E.N.S.: No Heat Application: No Leg drop or elevation: No Is the Current Pain Management Adequate: Inadequate How does your wound impact your activities of daily livingo Sleep: No Bathing: No Appetite: No Relationship With Others: No Bladder Continence: No Emotions: No Bowel Continence: No Work: No Toileting: No Drive: No Dressing: No Hobbies: No Electronic Signature(s) Signed: 07/30/2022 4:56:13 PM By: Rosalio Loud MSN RN CNS WTA Signed: 08/05/2022 2:03:30 PM By: Massie Kluver Entered By: Massie Kluver on 07/30/2022 15:46:16 -------------------------------------------------------------------------------- Patient/Caregiver Education Details Patient Name: Date of Service: Molly Dennis 12/28/2023andnbsp3:15 PM Medical Record Number: [id number] Patient Account Number: [id number] Date of Birth/Gender: Treating RN: [birth year] (52 y.o. [name] Primary Care Physician: Karl Ito Other Clinician: Massie Kluver Referring Physician: Treating Physician/Extender: Eldridge Dace, MICHA EL Ferd Glassing in Treatment: 6 Education Assessment Education Provided ToRAYMA, HEGG ([id number])  [id number] Page 8 of 11 Patient and Caregiver Education Topics Provided Offloading: Handouts: How Offloading Helps Foot Wounds Heal Methods: Explain/Verbal Responses: State content correctly Electronic Signature(s) Signed: 08/05/2022 2:03:30 PM By: Massie Kluver Entered By: Massie Kluver on 07/30/2022 16:06:30 -------------------------------------------------------------------------------- Wound Assessment Details Patient Name: Date of Service: Molly Dennis, Molly Dennis 07/30/2022 3:15 PM Medical Record Number: [id number] Patient Account Number: [id number] Date of Birth/Sex: Treating RN: 01/07/1971 (52 y.o. [name] Primary Care Elaura Calix: Karl Ito Other Clinician: Massie Kluver Referring Mozelle Remlinger: Treating Barth Trella/Extender: RO BSO N, MICHA EL Ferd Glassing in Treatment: 6 Wound Status Wound Number: 1 Primary Etiology: Pressure Ulcer Wound Location: Right, Distal, Posterior Calcaneus Wound Status: Open Wounding Event: Trauma Comorbid History: Asthma, Chronic Obstructive Pulmonary Disease (COPD) Date Acquired: 02/14/2021 Weeks Of Treatment: 6 Clustered Wound: No Photos Wound Measurements Length: (cm) 0.8 Width: (cm) 1.3 Depth: (cm) 0.1 Area: (cm) 0.817 Volume: (cm) 0.082 % Reduction in Area: 62.8% % Reduction in Volume: 62.7% Epithelialization: Small (1-33%) Wound Description Classification: Category/Stage III Wound Margin: Distinct, outline attached Exudate Amount: Medium Exudate Type: Serosanguineous Exudate Color: red, brown Foul Odor After Cleansing: No Slough/Fibrino No Wound Bed [name], Trisha ([id number]) [id number] Page 9 of 11 Granulation Amount: Large (67-100%) Exposed Structure Granulation Quality: Pink Fascia Exposed: No Necrotic Amount: Small (1-33%) Fat Layer (Subcutaneous Tissue) Exposed: Yes Necrotic Quality: Adherent Slough Tendon Exposed: No Muscle Exposed: No Joint Exposed:  No Bone Exposed: No Treatment Notes Wound #1 (Calcaneus) Wound Laterality: Right, Posterior, Distal Cleanser Soap and Water Discharge Instruction: Gently cleanse wound with antibacterial soap, rinse  and pat dry prior to dressing wounds Peri-Wound Care Topical Primary Dressing Hydrofera Blue Ready Transfer Foam, 2.5x2.5 (in/in) Discharge Instruction: Apply Hydrofera Blue Ready to wound bed as directed Secondary Dressing ABD Pad 5x9 (in/in) Discharge Instruction: Cover with ABD pad Secured With Kerlix Roll Sterile or Non-Sterile 6-ply 4.5x4 (yd/yd) Discharge Instruction: Apply Kerlix as directed Compression Wrap Compression Stockings Add-Ons Electronic Signature(s) Signed: 07/30/2022 4:56:13 PM By: Rosalio Loud MSN RN CNS WTA Signed: 08/05/2022 2:03:30 PM By: Massie Kluver Entered By: Massie Kluver on 07/30/2022 15:47:02 -------------------------------------------------------------------------------- Wound Assessment Details Patient Name: Date of Service: Molly Dennis, Molly Dennis 07/30/2022 3:15 PM Medical Record Number: [id number] Patient Account Number: [id number] Date of Birth/Sex: Treating RN: [birth year] (52 y.o. [name] Primary Care Cuma Polyakov: Karl Ito Other Clinician: Massie Kluver Referring Arlisha Patalano: Treating Anikah Hogge/Extender: RO BSO N, MICHA EL Ferd Glassing in Treatment: 6 Wound Status Wound Number: 2 Primary Etiology: Pressure Ulcer Wound Location: Right, Proximal, Posterior Calcaneus Wound Status: Open Wounding Event: Trauma Comorbid History: Asthma, Chronic Obstructive Pulmonary Disease (COPD) Date Acquired: 02/14/2021 Weeks Of Treatment: 6 Clustered Wound: No Photos [name], [name] ([id number]) [id number] Page 10 of 11 Wound Measurements Length: (cm) 0.1 Width: (cm) 0.1 Depth: (cm) 0.1 Area: (cm) 0.008 Volume: (cm) 0.001 % Reduction in Area: 97.2% % Reduction in Volume: 96.4% Epithelialization: Small  (1-33%) Wound Description Classification: Category/Stage III Wound Margin: Distinct, outline attached Exudate Amount: Small Exudate Type: Serosanguineous Exudate Color: red, brown Foul Odor After Cleansing: No Slough/Fibrino No Wound Bed Granulation Amount: Large (67-100%) Exposed Structure Granulation Quality: Pink Fascia Exposed: No Necrotic Amount: Small (1-33%) Fat Layer (Subcutaneous Tissue) Exposed: Yes Necrotic Quality: Adherent Slough Tendon Exposed: No Muscle Exposed: No Joint Exposed: No Bone Exposed: No Treatment Notes Wound #2 (Calcaneus) Wound Laterality: Right, Posterior, Proximal Cleanser [place]ram Ancillary Kit - 15 Day Supply Discharge Instruction: Use supplies as instructed; Kit contains: (15) Saline Bullets; (15) 3x3 Gauze; 15 pr Gloves Soap and Water Discharge Instruction: Gently cleanse wound with antibacterial soap, rinse and pat dry prior to dressing wounds Peri-Wound Care Topical Primary Dressing Hydrofera Blue Ready Transfer Foam, 2.5x2.5 (in/in) Discharge Instruction: Apply Hydrofera Blue Ready to wound bed as directed Secondary Dressing ABD Pad 5x9 (in/in) Discharge Instruction: Cover with ABD pad Secured With Kerlix Roll Sterile or Non-Sterile 6-ply 4.5x4 (yd/yd) Discharge Instruction: Apply Kerlix as directed Compression Wrap Compression Stockings Add-Ons Electronic Signature(s) Signed: 07/30/2022 4:56:13 PM By: Rosalio Loud MSN RN CNS WTA Signed: 08/05/2022 2:03:30 PM By: Massie Kluver Entered By: Massie Kluver on 07/30/2022 15:47:28 [name], [name] ([id number]) [id number] Page 11 of 11 -------------------------------------------------------------------------------- Vitals Details Patient Name: Date of Service: Molly Dennis, Molly Dennis 07/30/2022 3:15 PM Medical Record Number: [id number] Patient Account Number: [id number] Date of Birth/Sex: Treating RN: Mar 25, 1971 (52 y.o. [name] Primary Care Primo Innis: Karl Ito Other Clinician: Massie Kluver Referring Wesly Whisenant: Treating Cheralyn Oliver/Extender: RO BSO N, MICHA EL Ferd Glassing in Treatment: 6 Vital Signs Time Taken: 15:32 Temperature (F): 98.0 Height (in): 65 Pulse (bpm): 85 Weight (lbs): 170 Respiratory Rate (breaths/min): 16 Body Mass Index (BMI): 28.3 Blood Pressure (mmHg): 105/74 Reference Range: 80 - 120 mg / dl Electronic Signature(s) Signed: 08/05/2022 2:03:30 PM By: Massie Kluver Entered By: Massie Kluver on 07/30/2022 15:46:13
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
## 2022-08-10 ENCOUNTER — Ambulatory Visit: Payer: Medicare Other | Admitting: Internal Medicine

## 2022-08-12 ENCOUNTER — Ambulatory Visit: Payer: Medicaid Other | Admitting: Nurse Practitioner

## 2022-08-13 ENCOUNTER — Ambulatory Visit: Payer: Medicare Other | Admitting: Physician Assistant

## 2022-08-13 ENCOUNTER — Ambulatory Visit: Payer: Medicare Other | Admitting: Infectious Diseases

## 2022-08-17 ENCOUNTER — Ambulatory Visit: Payer: Medicaid Other | Admitting: Nurse Practitioner

## 2022-08-20 ENCOUNTER — Telehealth: Payer: Self-pay

## 2022-08-20 ENCOUNTER — Ambulatory Visit: Payer: Medicaid Other | Admitting: Physician Assistant

## 2022-08-20 ENCOUNTER — Ambulatory Visit: Payer: Medicaid Other | Admitting: Infectious Diseases

## 2022-08-20 NOTE — Telephone Encounter (Signed)
Patient called office to reschedule appointment for next month. Requested to speak to either Dr. Delaine Lame or nurse staff to discuss reason for visit.  Informed patient that this visit was to discuss how she is handling her Biktarvy, get updated labs if needed, and to discuss any concerns she might have regarding her diagnosis/ treatment.  Patient verbalized understanding. Is scheduled to come back on 2/20. Will call pharmacy to see if they have refills on file for [place]. If not will have pharmacy send request to office. Leatrice Jewels, RMA

## 2022-08-21 ENCOUNTER — Telehealth: Payer: Self-pay | Admitting: Nurse Practitioner

## 2022-08-21 DIAGNOSIS — B20 Human immunodeficiency virus [HIV] disease: Secondary | ICD-10-CM

## 2022-08-21 NOTE — Telephone Encounter (Signed)
This rx comes from ID provider. See TE dated 08/20/22 from ID office.  Patient will need to request from their office.   Patient aware, she will contact their office for assistance.

## 2022-08-21 NOTE — Telephone Encounter (Signed)
Prescription Request  08/21/2022  Is this a "Controlled Substance" medicine? No  LOV: 07/08/2022  What is the name of the medication or equipment? bictegravir-emtricitabine-tenofovir AF (BIKTARVY) 50-200-25 MG TABS tablet   Have you contacted your pharmacy to request a refill? Yes   Which pharmacy would you like this sent to?  [place], [place] [address] [place] [address] Phone: [phone] Fax: [phone]    Patient notified that their request is being sent to the clinical staff for review and that they should receive a response within 2 business days.   Please advise at Mobile [phone] (mobile)

## 2022-08-25 ENCOUNTER — Other Ambulatory Visit: Payer: Self-pay

## 2022-08-25 DIAGNOSIS — B20 Human immunodeficiency virus [HIV] disease: Secondary | ICD-10-CM

## 2022-08-25 MED ORDER — BICTEGRAVIR-EMTRICITAB-TENOFOV 50-200-25 MG PO TABS: 1.0000 | ORAL_TABLET | Freq: Every day | ORAL | 0 refills | Status: DC

## 2022-08-28 ENCOUNTER — Ambulatory Visit: Payer: Medicaid Other | Admitting: Physician Assistant

## 2022-09-07 ENCOUNTER — Encounter: Payer: 59 | Attending: Physician Assistant | Admitting: Physician Assistant

## 2022-09-07 DIAGNOSIS — Z09 Encounter for follow-up examination after completed treatment for conditions other than malignant neoplasm: Secondary | ICD-10-CM | POA: Diagnosis not present

## 2022-09-07 DIAGNOSIS — F17218 Nicotine dependence, cigarettes, with other nicotine-induced disorders: Secondary | ICD-10-CM | POA: Diagnosis not present

## 2022-09-07 DIAGNOSIS — W230XXA Caught, crushed, jammed, or pinched between moving objects, initial encounter: Secondary | ICD-10-CM | POA: Diagnosis not present

## 2022-09-07 DIAGNOSIS — J449 Chronic obstructive pulmonary disease, unspecified: Secondary | ICD-10-CM | POA: Insufficient documentation

## 2022-09-07 DIAGNOSIS — G40909 Epilepsy, unspecified, not intractable, without status epilepticus: Secondary | ICD-10-CM | POA: Insufficient documentation

## 2022-09-07 DIAGNOSIS — L89613 Pressure ulcer of right heel, stage 3: Secondary | ICD-10-CM | POA: Diagnosis present

## 2022-09-07 DIAGNOSIS — Z8673 Personal history of transient ischemic attack (TIA), and cerebral infarction without residual deficits: Secondary | ICD-10-CM | POA: Insufficient documentation

## 2022-09-07 NOTE — Progress Notes (Signed)
Molly Dennis, Molly Dennis ([id number]) [id number] Page 1 of 9 Visit Report for 09/07/2022 Arrival Information Details Patient Name: Date of Service: Molly Dennis, Molly Dennis 09/07/2022 2:00 PM Medical Record Number: [id number] Patient Account Number: [id number] Date of Birth/Sex: Treating RN: 03/12/1971 (52 y.o. Molly Dennis, Molly Dennis Primary Care Molly Dennis: Molly Dennis Other Clinician: Massie Dennis Referring Molly Dennis: Treating Molly Dennis/Extender: Molly Dennis: 12 Visit Information History Since Last Visit All ordered tests and consults were completed: No Patient Arrived: Ambulatory Added or deleted any medications: No Arrival Time: 14:50 Any new allergies or adverse reactions: No Transfer Assistance: None Had a fall or experienced change in No Patient Requires Transmission-Based Precautions: No activities of daily living that may affect Patient Has Alerts: Yes risk of falls: Patient Alerts: ABI R 1.29 Signs or symptoms of abuse/neglect since No last visito Hospitalized since last visit: No Implantable device outside of the clinic No excluding cellular tissue based products placed in the center since last visit: Has Dressing in Place as Prescribed: Yes Has Footwear/Offloading in Place as Yes Prescribed: Left: Surgical Shoe with Pressure Relief Insole Pain Present Now: No Electronic Signature(s) Signed: 09/08/2022 4:49:02 PM By: Molly Dennis Entered By: Molly Dennis on 09/07/2022 14:50:58 -------------------------------------------------------------------------------- Clinic Level of Care Assessment Details Patient Name: Date of Service: Molly Dennis, Molly Dennis 09/07/2022 2:00 PM Medical Record Number: [id number] Patient Account Number: [id number] Date of Birth/Sex: Treating RN: 1971/02/12 (52 y.o. Molly Dennis Primary Care Alassane Kalafut: Molly Dennis Other Clinician: Massie Dennis Referring Molly Dennis: Treating Molly Dennis/Extender: Molly Dennis: [facility] Level of Care Assessment Items TOOL 4 Quantity Score Molly Dennis, Molly Dennis ([id number]) [id number] Page 2 of 9 []  - 0 Use when only an EandM is performed on FOLLOW-UP visit ASSESSMENTS - Nursing Assessment / Reassessment X- 1 10 Reassessment of Co-morbidities (includes updates in patient status) X- 1 5 Reassessment of Adherence to Dennis Plan ASSESSMENTS - Wound and Skin A ssessment / Reassessment []  - 0 Simple Wound Assessment / Reassessment - one wound X- 2 5 Complex Wound Assessment / Reassessment - multiple wounds []  - 0 Dermatologic / Skin Assessment (not related to wound area) ASSESSMENTS - Focused Assessment []  - 0 Circumferential Edema Measurements - multi extremities []  - 0 Nutritional Assessment / Counseling / Intervention []  - 0 Lower Extremity Assessment (monofilament, tuning fork, pulses) []  - 0 Peripheral Arterial Disease Assessment (using hand held doppler) ASSESSMENTS - Ostomy and/or Continence Assessment and Care []  - 0 Incontinence Assessment and Management []  - 0 Ostomy Care Assessment and Management (repouching, etc.) PROCESS - Coordination of Care X - Simple Patient / Family Education for ongoing care 1 15 []  - 0 Complex (extensive) Patient / Family Education for ongoing care []  - 0 Staff obtains Programmer, systems, Records, T Results / Process Orders est []  - 0 Staff telephones HHA, Nursing Homes / Clarify orders / etc []  - 0 Routine Transfer to another Facility (non-emergent condition) []  - 0 Routine Hospital Admission (non-emergent condition) []  - 0 New Admissions / Biomedical engineer / Ordering NPWT Apligraf, etc. , []  - 0 Emergency Hospital Admission (emergent condition) X- 1 10 Simple Discharge Coordination []  - 0 Complex (extensive) Discharge Coordination PROCESS - Special Needs []  - 0 Pediatric / Minor Patient Management []  - 0 Isolation Patient Management []  -  0 Hearing / Language / Visual special needs []  - 0 Assessment of Community assistance (transportation, D/C planning, etc.) []  - 0 Additional assistance / Altered mentation []  - 0 Support Surface(s) Assessment (bed, cushion, seat,  etc.) INTERVENTIONS - Wound Cleansing / Measurement X - Simple Wound Cleansing - one wound 1 5 []  - 0 Complex Wound Cleansing - multiple wounds X- 1 5 Wound Imaging (photographs - any number of wounds) []  - 0 Wound Tracing (instead of photographs) []  - 0 Simple Wound Measurement - one wound []  - 0 Complex Wound Measurement - multiple wounds INTERVENTIONS - Wound Dressings []  - 0 Small Wound Dressing one or multiple wounds []  - 0 Medium Wound Dressing one or multiple wounds []  - 0 Large Wound Dressing one or multiple wounds Molly Dennis, Molly Dennis ([id number]) [id number] Page 3 of 9 X- 1 5 Application of Medications - topical []  - 0 Application of Medications - injection INTERVENTIONS - Miscellaneous []  - 0 External ear exam []  - 0 Specimen Collection (cultures, biopsies, blood, body fluids, etc.) []  - 0 Specimen(s) / Culture(s) sent or taken to Lab for analysis []  - 0 Patient Transfer (multiple staff / Civil Service fast streamer / Similar devices) []  - 0 Simple Staple / Suture removal (25 or less) []  - 0 Complex Staple / Suture removal (26 or more) []  - 0 Hypo / Hyperglycemic Management (close monitor of Blood Glucose) []  - 0 Ankle / Brachial Index (ABI) - do not check if billed separately X- 1 5 Vital Signs Has the patient been seen at the hospital within the last three years: Yes Total Score: 70 Level Of Care: New/Established - Level 2 Electronic Signature(s) Signed: 09/08/2022 4:49:02 PM By: Molly Dennis Entered By: Molly Dennis on 09/07/2022 15:14:05 -------------------------------------------------------------------------------- Encounter Discharge Information Details Patient Name: Date of Service: Molly Dennis  09/07/2022 2:00 PM Medical Record Number: [id number] Patient Account Number: [id number] Date of Birth/Sex: Treating RN: 1971-07-15 (52 y.o. Molly Dennis, Molly Dennis Primary Care Molly Dennis: Molly Dennis Other Clinician: Massie Dennis Referring Merrill Deanda: Treating Mc Bloodworth/Extender: Molly Dennis: 12 Encounter Discharge Information Items Discharge Condition: Stable Ambulatory Status: Ambulatory Discharge Destination: Home Transportation: Private Auto Accompanied By: caregiver Schedule Follow-up Appointment: Yes Clinical Summary of Care: Electronic Signature(s) Signed: 09/08/2022 4:49:02 PM By: Molly Dennis Entered By: Molly Dennis on 09/07/2022 16:13:30 Molly Dennis, Molly Dennis ([id number]) [id number] Page 4 of 9 -------------------------------------------------------------------------------- Lower Extremity Assessment Details Patient Name: Date of Service: Molly Dennis, Molly Dennis 09/07/2022 2:00 PM Medical Record Number: [id number] Patient Account Number: [id number] Date of Birth/Sex: Treating RN: 20-Jun-1971 (52 y.o. Molly Dennis Primary Care Jeronda Don: Molly Dennis Other Clinician: Massie Dennis Referring Marce Schartz: Treating Gudrun Axe/Extender: Molly Dennis: 12 Electronic Signature(s) Signed: 09/08/2022 9:10:22 AM By: Gretta Cool BSN, RN, CWS, [name] RN, BSN Signed: 09/08/2022 4:49:02 PM By: Molly Dennis Entered By: Molly Dennis on 09/07/2022 15:04:46 -------------------------------------------------------------------------------- Multi Wound Chart Details Patient Name: Date of Service: Molly Dennis, Molly Dennis 09/07/2022 2:00 PM Medical Record Number: [id number] Patient Account Number: [id number] Date of Birth/Sex: Treating RN: [birth year] (52 y.o. Molly Dennis Primary Care Quavis Klutz: Molly Dennis Other Clinician: Massie Dennis Referring Lansing Sigmon: Treating Mendy Chou/Extender: Molly Dennis:  12 Vital Signs Height(in): 65 Pulse(bpm): 6 Weight(lbs): 170 Blood Pressure(mmHg): 118/79 Body Mass Index(BMI): 28.3 Temperature(F): 98.0 Respiratory Rate(breaths/min): 16 [1:Photos:] [N/A:N/A] Right, Distal, Posterior Calcaneus Right, Proximal, Posterior Calcaneus N/A Wound Location: Trauma Trauma N/A Wounding Event: Pressure Ulcer Pressure Ulcer N/A Primary Etiology: Asthma, Chronic Obstructive Asthma, Chronic Obstructive N/A Comorbid History: Pulmonary Disease (COPD) Pulmonary Disease (COPD) 02/14/2021 02/14/2021 N/A Date Acquired: 12 12 N/A Weeks of Dennis: Open Open N/A Wound Status: Helaine Chess ([id number]) [id number] Page 5 of 9 No No N/A Wound Recurrence: 0.1x0.1x0.1  0.1x0.1x0.1 N/A Measurements L x W x D (cm) 0.008 0.008 N/A A (cm) : rea 0.001 0.001 N/A Volume (cm) : 99.60% 97.20% N/A % Reduction in A rea: 99.50% 96.40% N/A % Reduction in Volume: Category/Stage III Category/Stage III N/A Classification: Medium Small N/A Exudate A mount: Serosanguineous Serosanguineous N/A Exudate Type: red, brown red, brown N/A Exudate Color: Distinct, outline attached Distinct, outline attached N/A Wound Margin: Large (67-100%) Large (67-100%) N/A Granulation A mount: Pink Pink N/A Granulation Quality: Small (1-33%) Small (1-33%) N/A Necrotic A mount: Fat Layer (Subcutaneous Tissue): Yes Fat Layer (Subcutaneous Tissue): Yes N/A Exposed Structures: Fascia: No Fascia: No Tendon: No Tendon: No Muscle: No Muscle: No Joint: No Joint: No Bone: No Bone: No Small (1-33%) Small (1-33%) N/A Epithelialization: Dennis Notes Electronic Signature(s) Signed: 09/08/2022 4:49:02 PM By: Betha Loa Entered By: Betha Loa on 09/07/2022 15:07:10 -------------------------------------------------------------------------------- Multi-Disciplinary Care Plan Details Patient Name: Date of Service: Molly Dennis 09/07/2022 2:00  PM Medical Record Number: [id number] Patient Account Number: [id number] Date of Birth/Sex: Treating RN: November 29, 1970 (52 y.o. Skip Mayer Primary Care Yasmen Cortner: Mordecai Maes Other Clinician: Betha Loa Referring Jmari Pelc: Treating Dawnya Grams/Extender: Stephanie Griffiths in Dennis: 12 Active Inactive Electronic Signature(s) Signed: 09/08/2022 9:10:22 AM By: Elliot Gurney BSN, RN, CWS, [name] RN, BSN Signed: 09/08/2022 4:49:02 PM By: Betha Loa Entered By: Betha Loa on 09/07/2022 16:12:05 -------------------------------------------------------------------------------- Pain Assessment Details Patient Name: Date of Service: Molly Dennis 09/07/2022 2:00 PM Molly Dennis, [name] ([id number]) [id number] Page 6 of 9 Medical Record Number: [id number] Patient Account Number: [id number] Date of Birth/Sex: Treating RN: 11-01-1970 (52 y.o. Skip Mayer Primary Care Tatumn Corbridge: Mordecai Maes Other Clinician: Betha Loa Referring Xena Propst: Treating Abbigayle Toole/Extender: Jazma Griffiths in Dennis: 12 Active Problems Location of Pain Severity and Description of Pain Patient Has Paino No Site Locations Pain Management and Medication Current Pain Management: Electronic Signature(s) Signed: 09/08/2022 9:10:22 AM By: Elliot Gurney, BSN, RN, CWS, [name] RN, BSN Signed: 09/08/2022 4:49:02 PM By: Betha Loa Entered By: Betha Loa on 09/07/2022 14:53:56 -------------------------------------------------------------------------------- Patient/Caregiver Education Details Patient Name: Date of Service: Molly Dennis 2/5/2024andnbsp2:00 PM Medical Record Number: [id number] Patient Account Number: [id number] Date of Birth/Gender: Treating RN: [birth year] (52 y.o. Skip Mayer Primary Care Physician: Mordecai Maes Other Clinician: Betha Loa Referring Physician: Treating Physician/Extender: Layann Griffiths in Dennis: 12 Education  Assessment Education Provided To: Patient Education Topics Provided Wound/Skin Impairment: Handouts: Other: your wound has healed. Please call if any further issues arise Methods: Explain/Verbal Responses: State content correctly Molly Dennis, [name] ([id number]) [id number] Page 7 of 9 Electronic Signature(s) Signed: 09/08/2022 4:49:02 PM By: Betha Loa Entered By: Betha Loa on 09/07/2022 15:22:07 -------------------------------------------------------------------------------- Wound Assessment Details Patient Name: Date of Service: Molly Dennis, Molly Dennis 09/07/2022 2:00 PM Medical Record Number: [id number] Patient Account Number: [id number] Date of Birth/Sex: Treating RN: [birth year] (53 y.o. Cathlean Cower, Molly Dennis Primary Care Gracen Southwell: Mordecai Maes Other Clinician: Betha Loa Referring Weyman Bogdon: Treating Geoffery Aultman/Extender: Avalene Griffiths in Dennis: 12 Wound Status Wound Number: 1 Primary Etiology: Pressure Ulcer Wound Location: Right, Distal, Posterior Calcaneus Wound Status: Healed - Epithelialized Wounding Event: Trauma Comorbid History: Asthma, Chronic Obstructive Pulmonary Disease (COPD) Date Acquired: 02/14/2021 Weeks Of Dennis: 12 Clustered Wound: No Photos Wound Measurements Length: (cm) Width: (cm) Depth: (cm) Area: (cm) Volume: (cm) 0 % Reduction in Area: 100% 0 % Reduction in Volume: 100% 0 Epithelialization: Large (67-100%) 0 0 Wound Description Classification: Category/Stage III Wound Margin: Distinct, outline attached Exudate Amount: None Present Foul  Odor After Cleansing: No Slough/Fibrino No Wound Bed Granulation Amount: None Present (0%) Exposed Structure Necrotic Amount: Small (1-33%) Fascia Exposed: No Fat Layer (Subcutaneous Tissue) Exposed: Yes Tendon Exposed: No Muscle Exposed: No Joint Exposed: No Bone Exposed: No Dennis Notes Wound #1 (Calcaneus) Wound Laterality: Right, Posterior,  Distal Molly Dennis, Rebie ([id number]) [id number] Page 8 of 9 Cleanser Peri-Wound Care Topical Primary Dressing Secondary Dressing Secured With Compression Wrap Compression Stockings Add-Ons Electronic Signature(s) Signed: 09/08/2022 9:10:22 AM By: Gretta Cool, BSN, RN, CWS, [name] RN, BSN Signed: 09/08/2022 4:49:02 PM By: Molly Dennis Entered By: Molly Dennis on 09/07/2022 15:10:18 -------------------------------------------------------------------------------- Wound Assessment Details Patient Name: Date of Service: Molly Dennis, Molly Dennis 09/07/2022 2:00 PM Medical Record Number: [id number] Patient Account Number: [id number] Date of Birth/Sex: Treating RN: 1970-11-17 (52 y.o. Molly Dennis, Molly Dennis Primary Care Jolisa Intriago: Molly Dennis Other Clinician: Massie Dennis Referring Delorse Shane: Treating Delonda Coley/Extender: Molly Dennis: 12 Wound Status Wound Number: 2 Primary Etiology: Pressure Ulcer Wound Location: Right, Proximal, Posterior Calcaneus Wound Status: Healed - Epithelialized Wounding Event: Trauma Comorbid History: Asthma, Chronic Obstructive Pulmonary Disease (COPD) Date Acquired: 02/14/2021 Weeks Of Dennis: 12 Clustered Wound: No Photos Wound Measurements Length: (cm) Width: (cm) Depth: (cm) Area: (cm) Volume: (cm) 0 % Reduction in Area: 100% 0 % Reduction in Volume: 100% 0 Epithelialization: Large (67-100%) 0 0 Wound Description Classification: Category/Stage III Molly Dennis, Airiana ([id number]) Wound Margin: Distinct, outline attached Exudate Amount: None Present Foul Odor After Cleansing: No [id number] Page 9 of 9 Slough/Fibrino No Wound Bed Granulation Amount: None Present (0%) Exposed Structure Necrotic Amount: Small (1-33%) Fascia Exposed: No Fat Layer (Subcutaneous Tissue) Exposed: Yes Tendon Exposed: No Muscle Exposed: No Joint Exposed: No Bone Exposed: No Dennis Notes Wound  #2 (Calcaneus) Wound Laterality: Right, Posterior, Proximal Cleanser Peri-Wound Care Topical Primary Dressing Secondary Dressing Secured With Compression Wrap Compression Stockings Add-Ons Electronic Signature(s) Signed: 09/08/2022 9:10:22 AM By: Gretta Cool, BSN, RN, CWS, [name] RN, BSN Signed: 09/08/2022 4:49:02 PM By: Molly Dennis Entered By: Molly Dennis on 09/07/2022 15:10:32 -------------------------------------------------------------------------------- [place] Details Patient Name: Date of Service: Molly Dennis 09/07/2022 2:00 PM Medical Record Number: [id number] Patient Account Number: [id number] Date of Birth/Sex: Treating RN: 03-18-1971 (52 y.o. Molly Dennis, Molly Dennis Primary Care Kaliana Albino: Molly Dennis Other Clinician: Massie Dennis Referring Hakop Humbarger: Treating Alec Mcphee/Extender: Molly Dennis: 12 Vital Signs Time Taken: 14:51 Temperature (F): 98.0 Height (in): 65 Pulse (bpm): 92 Weight (lbs): 170 Respiratory Rate (breaths/min): 16 Body Mass Index (BMI): 28.3 Blood Pressure (mmHg): 118/79 Reference Range: 80 - 120 mg / dl Electronic Signature(s) Signed: 09/08/2022 4:49:02 PM By: Molly Dennis Entered By: Molly Dennis on 09/07/2022 14:53:23
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
## 2022-09-09 ENCOUNTER — Ambulatory Visit: Payer: Medicaid Other | Admitting: Nurse Practitioner

## 2022-09-11 ENCOUNTER — Other Ambulatory Visit: Payer: Self-pay | Admitting: Nurse Practitioner

## 2022-09-11 DIAGNOSIS — G629 Polyneuropathy, unspecified: Secondary | ICD-10-CM

## 2022-09-14 ENCOUNTER — Ambulatory Visit: Payer: Medicaid Other | Admitting: Nurse Practitioner

## 2022-09-22 ENCOUNTER — Other Ambulatory Visit: Payer: Self-pay

## 2022-09-22 ENCOUNTER — Inpatient Hospital Stay: Payer: Medicaid Other | Admitting: Infectious Diseases

## 2022-09-22 DIAGNOSIS — B20 Human immunodeficiency virus [HIV] disease: Secondary | ICD-10-CM

## 2022-09-22 MED ORDER — BICTEGRAVIR-EMTRICITAB-TENOFOV 50-200-25 MG PO TABS: 1.0000 | ORAL_TABLET | Freq: Every day | ORAL | 0 refills | Status: DC

## 2022-10-01 ENCOUNTER — Encounter: Payer: Self-pay | Admitting: Infectious Diseases

## 2022-10-01 ENCOUNTER — Other Ambulatory Visit
Admission: RE | Admit: 2022-10-01 | Discharge: 2022-10-01 | Disposition: A | Discharge status: Home / Self Care | Payer: 59 | Source: Ambulatory Visit | Attending: Infectious Diseases | Admitting: Infectious Diseases

## 2022-10-01 ENCOUNTER — Ambulatory Visit: Payer: 59 | Attending: Infectious Diseases | Admitting: Infectious Diseases

## 2022-10-01 VITALS — BP 126/53 | HR 99 | Temp 97.9°F | Ht 65.5 in | Wt 145.0 lb

## 2022-10-01 DIAGNOSIS — Z8673 Personal history of transient ischemic attack (TIA), and cerebral infarction without residual deficits: Secondary | ICD-10-CM | POA: Diagnosis not present

## 2022-10-01 DIAGNOSIS — G40909 Epilepsy, unspecified, not intractable, without status epilepticus: Secondary | ICD-10-CM | POA: Diagnosis not present

## 2022-10-01 DIAGNOSIS — Z79899 Other long term (current) drug therapy: Secondary | ICD-10-CM | POA: Insufficient documentation

## 2022-10-01 DIAGNOSIS — B20 Human immunodeficiency virus [HIV] disease: Secondary | ICD-10-CM | POA: Insufficient documentation

## 2022-10-01 DIAGNOSIS — Z23 Encounter for immunization: Secondary | ICD-10-CM | POA: Diagnosis not present

## 2022-10-01 DIAGNOSIS — F32A Depression, unspecified: Secondary | ICD-10-CM | POA: Insufficient documentation

## 2022-10-01 DIAGNOSIS — F419 Anxiety disorder, unspecified: Secondary | ICD-10-CM | POA: Insufficient documentation

## 2022-10-01 DIAGNOSIS — F17200 Nicotine dependence, unspecified, uncomplicated: Secondary | ICD-10-CM | POA: Insufficient documentation

## 2022-10-01 LAB — COMPREHENSIVE METABOLIC PANEL
ALT: 13 U/L (ref 0–44)
AST: 16 U/L (ref 15–41)
Albumin: 3.5 g/dL (ref 3.5–5.0)
Alkaline Phosphatase: 88 U/L (ref 38–126)
Anion gap: 12 (ref 5–15)
BUN: 8 mg/dL (ref 6–20)
CO2: 27 mmol/L (ref 22–32)
Calcium: 9.3 mg/dL (ref 8.9–10.3)
Chloride: 99 mmol/L (ref 98–111)
Creatinine, Ser: 0.75 mg/dL (ref 0.44–1.00)
GFR, Estimated: >60 mL/min (ref >60)
Glucose, Bld: 98 mg/dL (ref 70–99)
Potassium: 3.5 mmol/L (ref 3.5–5.1)
Sodium: 138 mmol/L (ref 135–145)
Total Bilirubin: 0.3 mg/dL (ref 0.3–1.2)
Total Protein: 8 g/dL (ref 6.5–8.1)

## 2022-10-01 LAB — CBC WITH DIFFERENTIAL/PLATELET
Abs Immature Granulocytes: 0.12 10*3/uL — ABNORMAL HIGH (ref 0.00–0.07)
Basophils Absolute: 0.1 10*3/uL (ref 0.0–0.1)
Basophils Relative: 1 %
Eosinophils Absolute: 0.3 10*3/uL (ref 0.0–0.5)
Eosinophils Relative: 3 %
HCT: 39.8 % (ref 36.0–46.0)
Hemoglobin: 13 g/dL (ref 12.0–15.0)
Immature Granulocytes: 1 %
Lymphocytes Relative: 22 %
Lymphs Abs: 2 10*3/uL (ref 0.7–4.0)
MCH: 29.5 pg (ref 26.0–34.0)
MCHC: 32.7 g/dL (ref 30.0–36.0)
MCV: 90.2 fL (ref 80.0–100.0)
Monocytes Absolute: 0.4 10*3/uL (ref 0.1–1.0)
Monocytes Relative: 4 %
Neutro Abs: 6.4 10*3/uL (ref 1.7–7.7)
Neutrophils Relative %: 69 %
Platelets: 458 10*3/uL — ABNORMAL HIGH (ref 150–400)
RBC: 4.41 MIL/uL (ref 3.87–5.11)
RDW: 14.5 % (ref 11.5–15.5)
WBC: 9.3 10*3/uL (ref 4.0–10.5)
nRBC: 0 % (ref 0.0–0.2)

## 2022-10-01 MED ORDER — BIKTARVY 50-200-25 MG PO TABS: 1.0000 | ORAL_TABLET | Freq: Every day | ORAL | 3 refills | Status: DC
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
## 2022-10-01 NOTE — Patient Instructions (Signed)
You are here for HIV engagement. Today will do labs- continue Biktarvy Need mammogram, pap smear, Prevnar 20 vaccination, hepatitis B vaccine ( 3)

## 2022-10-02 LAB — T-HELPER CELLS CD4/CD8 %
% CD 4 Pos. Lymph.: 59.5 % — ABNORMAL HIGH (ref 30.8–58.5)
Absolute CD 4 Helper: 1250 /uL (ref 359–1519)
Basophils Absolute: 0 10*3/uL (ref 0.0–0.2)
Basos: 0 %
CD3+CD4+ Cells/CD3+CD8+ Cells Bld: 2.23 (ref 0.92–3.72)
CD3+CD8+ Cells # Bld: 561 /uL (ref 109–897)
CD3+CD8+ Cells NFr Bld: 26.7 % (ref 12.0–35.5)
EOS (ABSOLUTE): 0.3 10*3/uL (ref 0.0–0.4)
Eos: 3 %
Hematocrit: 37.5 % (ref 34.0–46.6)
Hemoglobin: 13.2 g/dL (ref 11.1–15.9)
Immature Grans (Abs): 0.1 10*3/uL (ref 0.0–0.1)
Immature Granulocytes: 1 %
Lymphocytes Absolute: 2.1 10*3/uL (ref 0.7–3.1)
Lymphs: 23 %
MCH: 31.2 pg (ref 26.6–33.0)
MCHC: 35.2 g/dL (ref 31.5–35.7)
MCV: 89 fL (ref 79–97)
Monocytes Absolute: 0.5 10*3/uL (ref 0.1–0.9)
Monocytes: 6 %
Neutrophils Absolute: 6.1 10*3/uL (ref 1.4–7.0)
Neutrophils: 67 %
Platelets: 485 10*3/uL — ABNORMAL HIGH (ref 150–450)
RBC: 4.23 x10E6/uL (ref 3.77–5.28)
RDW: 14.8 % (ref 11.7–15.4)
WBC: 9.1 10*3/uL (ref 3.4–10.8)

## 2022-10-02 LAB — HIV-1 RNA QUANT-NO REFLEX-BLD
HIV 1 RNA Quant: 160 copies/mL
LOG10 HIV-1 RNA: 2.204 log10copy/mL

## 2022-10-07 ENCOUNTER — Ambulatory Visit: Payer: 59 | Admitting: Nurse Practitioner

## 2022-10-09 ENCOUNTER — Other Ambulatory Visit: Payer: Self-pay

## 2022-10-09 ENCOUNTER — Telehealth: Payer: Self-pay

## 2022-10-09 DIAGNOSIS — Z8673 Personal history of transient ischemic attack (TIA), and cerebral infarction without residual deficits: Secondary | ICD-10-CM

## 2022-10-09 MED ORDER — ATORVASTATIN CALCIUM 40 MG PO TABS: 40.0000 mg | ORAL_TABLET | Freq: Every day | ORAL | 2 refills | Status: DC

## 2022-10-09 NOTE — Telephone Encounter (Signed)
-----   Message from Tsosie Billing, MD sent at 10/09/2022 11:25 AM EST ----- Please let her know that Cd4 is 1200 and Vl 140. Look good- to continue the medicine. thx ----- Message ----- From: Buel Ream, [facility] [place] Sent: 10/01/2022   1:09 PM EST To: Tsosie Billing, MD

## 2022-10-09 NOTE — Telephone Encounter (Signed)
Patient informed of lab results and verbalized understanding. Patient had no questions.  Molly Dennis Molly Dennis  

## 2022-10-09 NOTE — Telephone Encounter (Signed)
atorvastatin (LIPITOR) 40 MG tablet  Last visit 07/08/2022 Next Visit 10/14/2022

## 2022-10-14 ENCOUNTER — Ambulatory Visit (INDEPENDENT_AMBULATORY_CARE_PROVIDER_SITE_OTHER): Payer: 59 | Admitting: Nurse Practitioner

## 2022-10-14 ENCOUNTER — Encounter: Payer: Self-pay | Admitting: Nurse Practitioner

## 2022-10-14 ENCOUNTER — Ambulatory Visit: Payer: 59 | Admitting: Nurse Practitioner

## 2022-10-14 VITALS — BP 104/66 | HR 88 | Temp 98.1°F | Resp 16 | Ht 65.5 in | Wt 140.0 lb

## 2022-10-14 DIAGNOSIS — K219 Gastro-esophageal reflux disease without esophagitis: Secondary | ICD-10-CM | POA: Diagnosis not present

## 2022-10-14 DIAGNOSIS — R062 Wheezing: Secondary | ICD-10-CM | POA: Insufficient documentation

## 2022-10-14 DIAGNOSIS — L729 Follicular cyst of the skin and subcutaneous tissue, unspecified: Secondary | ICD-10-CM | POA: Diagnosis not present

## 2022-10-14 DIAGNOSIS — J42 Unspecified chronic bronchitis: Secondary | ICD-10-CM | POA: Diagnosis not present

## 2022-10-14 MED ORDER — OMEPRAZOLE 20 MG PO CPDR: 20.0000 mg | DELAYED_RELEASE_CAPSULE | Freq: Every day | ORAL | 3 refills | Status: DC

## 2022-10-14 MED ORDER — FLUTICASONE-SALMETEROL 250-50 MCG/ACT IN AEPB: 1.0000 | INHALATION_SPRAY | Freq: Two times a day (BID) | RESPIRATORY_TRACT | 2 refills | Status: DC

## 2022-10-14 MED ORDER — PREDNISONE 20 MG PO TABS: ORAL_TABLET | ORAL | 0 refills | Status: AC

## 2022-10-14 NOTE — Assessment & Plan Note (Signed)
Patient having breakthrough heartburn using Tums fairly regularly.  Will do omeprazole 20 mg daily for 3 months

## 2022-10-14 NOTE — Progress Notes (Signed)
Established Patient Office Visit  Subjective   Patient ID: Molly Dennis, female    DOB: 1970-11-17  Age: 52 y.o. MRN: [id number]  Chief Complaint  Patient presents with   Medical Management of Chronic Issues    HPI  HIV: states that she is being followed by ID and on Biktavy and is practicing safe sex.  She had some questions regarding HIV  Seizures: she is on levetiracetam.  Patient states she is taking medication as prescribed.  States that she has not had any seizure recently  COPD: states that she is using the albuterol inhaler daily and would like a nebulizer soulution. States that she is prescirbed wixela and does not use it  GERD: states that she has been having trouble since she was 15. States that she has been using tums daily. States that she does know some foods make it worse but does avoid  Skin lesion: states that they were a month ago. Not red or tender. Hard to the touch. Not discharging or drianing      Review of Systems  Constitutional:  Negative for chills and fever.  Respiratory:  Positive for shortness of breath.   Cardiovascular:  Negative for chest pain.  Gastrointestinal:        BM every other day    Neurological:  Negative for headaches.  Psychiatric/Behavioral:  Negative for hallucinations and suicidal ideas.       Objective:     BP 104/66   Pulse 88   Temp 98.1 F (36.7 C)   Resp 16   Ht 5' 5.5" (1.664 m)   Wt 140 lb (63.5 kg)   LMP  (LMP Unknown)   SpO2 98%   BMI 22.94 kg/m  BP Readings from Last 3 Encounters:  10/14/22 104/66  10/01/22 (!) 126/53  07/23/22 112/75   Wt Readings from Last 3 Encounters:  10/14/22 140 lb (63.5 kg)  10/01/22 145 lb (65.8 kg)  07/23/22 138 lb (62.6 kg)      Physical Exam Vitals and nursing note reviewed.  Constitutional:      Appearance: Normal appearance.  Cardiovascular:     Rate and Rhythm: Normal rate and regular rhythm.     Heart sounds: Normal heart sounds.  Pulmonary:      Effort: Pulmonary effort is normal.     Breath sounds: Wheezing and rhonchi present.  Skin:    Findings: Lesion present.          Comments: 3 BB size mobile nontender nonerythematous cyst under the skin.  Patient states they have not changed in size.  This is not a lymph node.  Neurological:     Mental Status: She is alert.      No results found for any visits on 10/14/22.    The 10-year ASCVD risk score (Arnett DK, et al., 2019) is: 1.8%    Assessment & Plan:   Problem List Items Addressed This Visit       Respiratory   Chronic bronchitis ([place]) - Primary    Patient is having used albuterol at least daily.  She requested nebulizer but given patient's QTc prolongation will stick with albuterol inhaler she has.  Will increase patient's Wixela to 250-50.  She is uses daily as prescribed and rinse her mouth after.      Relevant Medications   fluticasone-salmeterol (WIXELA INHUB) 250-50 MCG/ACT AEPB     Digestive   Gastroesophageal reflux disease    Patient having breakthrough heartburn using Tums fairly regularly.  Will do omeprazole 20 mg daily for 3 months      Relevant Medications   omeprazole (PRILOSEC) 20 MG capsule     Musculoskeletal and Integument   Skin cyst    Patient will do watchful waiting and monitor from home currently stable        Other   Wheezing    Noted on exam today.  Will do prednisone taper.  Patient will continue using albuterol inhaler as directed and start using Wixela inhaler.      Relevant Medications   predniSONE (DELTASONE) 20 MG tablet    Return in about 3 months (around 01/14/2023) for CPE and Labs.    Romilda Garret, NP

## 2022-10-14 NOTE — Assessment & Plan Note (Signed)
Noted on exam today.  Will do prednisone taper.  Patient will continue using albuterol inhaler as directed and start using Wixela inhaler.

## 2022-10-14 NOTE — Assessment & Plan Note (Signed)
Patient will do watchful waiting and monitor from home currently stable

## 2022-10-14 NOTE — Assessment & Plan Note (Signed)
Patient is having used albuterol at least daily.  She requested nebulizer but given patient's QTc prolongation will stick with albuterol inhaler she has.  Will increase patient's Wixela to 250-50.  She is uses daily as prescribed and rinse her mouth after.

## 2022-10-14 NOTE — Patient Instructions (Signed)
Nice to see you today Lets do your physical with blood work and pap smear in about 3 months

## 2022-11-04 ENCOUNTER — Telehealth: Payer: Self-pay | Admitting: Nurse Practitioner

## 2022-11-04 NOTE — Telephone Encounter (Signed)
  Prescription Request  11/04/2022  LOV: 10/14/2022  What is the name of the medication or equipment? albuterol (VENTOLIN HFA) 108 (90 Base) MCG/ACT inhaler   Have you contacted your pharmacy to request a refill? No   Which pharmacy would you like this sent to?  [facility], [place] [address] [place] [address] Phone: [phone] Fax: [phone]    Patient notified that their request is being sent to the clinical staff for review and that they should receive a response within 2 business days.   Please advise at Mobile [phone] (mobile)

## 2022-11-05 ENCOUNTER — Other Ambulatory Visit: Payer: Self-pay

## 2022-11-05 MED ORDER — ALBUTEROL SULFATE HFA 108 (90 BASE) MCG/ACT IN AERS: INHALATION_SPRAY | RESPIRATORY_TRACT | 0 refills | Status: DC

## 2022-11-05 NOTE — Telephone Encounter (Signed)
RX request sent to provider.

## 2022-11-05 NOTE — Telephone Encounter (Signed)
Albuterol (VENTOLIN HFA) 108 (90 Base) MCG/ACT inhaler Last visit: 10/14/2022 Next visit 01/14/2023

## 2022-11-30 ENCOUNTER — Telehealth: Payer: Self-pay | Admitting: Nurse Practitioner

## 2022-11-30 MED ORDER — ALBUTEROL SULFATE HFA 108 (90 BASE) MCG/ACT IN AERS: INHALATION_SPRAY | RESPIRATORY_TRACT | 0 refills | Status: DC

## 2022-11-30 NOTE — Telephone Encounter (Signed)
Omeprazole should have refills. I have sent in refills for the albuterol inhaler

## 2022-11-30 NOTE — Telephone Encounter (Signed)
Prescription Request  11/30/2022  LOV: 10/14/2022  What is the name of the medication or equipment? albuterol (VENTOLIN HFA) 108 (90 Base) MCG/ACT inhaler  omeprazole (PRILOSEC) 20 MG capsule  Have you contacted your pharmacy to request a refill? No   Which pharmacy would you like this sent to?  [facility] - [place], [place] - [address] [place] [address] Phone: [phone] Fax: [phone]    Patient notified that their request is being sent to the clinical staff for review and that they should receive a response within 2 business days.   Please advise at Mobile [phone] (mobile)

## 2022-12-09 ENCOUNTER — Other Ambulatory Visit: Payer: Self-pay | Admitting: Nurse Practitioner

## 2022-12-09 DIAGNOSIS — R569 Unspecified convulsions: Secondary | ICD-10-CM

## 2022-12-15 ENCOUNTER — Inpatient Hospital Stay: Payer: 59 | Admitting: Infectious Diseases

## 2022-12-24 ENCOUNTER — Encounter: Payer: Self-pay | Admitting: Infectious Diseases

## 2022-12-24 ENCOUNTER — Ambulatory Visit: Payer: 59 | Attending: Infectious Diseases | Admitting: Infectious Diseases

## 2022-12-24 ENCOUNTER — Ambulatory Visit
Admission: RE | Admit: 2022-12-24 | Discharge: 2022-12-24 | Disposition: A | Discharge status: Home / Self Care | Payer: 59 | Source: Ambulatory Visit | Attending: Infectious Diseases | Admitting: Infectious Diseases

## 2022-12-24 ENCOUNTER — Other Ambulatory Visit
Admission: RE | Admit: 2022-12-24 | Discharge: 2022-12-24 | Disposition: A | Discharge status: Home / Self Care | Payer: 59 | Source: Ambulatory Visit | Attending: Infectious Diseases | Admitting: Infectious Diseases

## 2022-12-24 VITALS — BP 104/73 | HR 91 | Temp 96.4°F | Ht 65.5 in | Wt 143.0 lb

## 2022-12-24 DIAGNOSIS — Z8673 Personal history of transient ischemic attack (TIA), and cerebral infarction without residual deficits: Secondary | ICD-10-CM | POA: Insufficient documentation

## 2022-12-24 DIAGNOSIS — Z79899 Other long term (current) drug therapy: Secondary | ICD-10-CM | POA: Insufficient documentation

## 2022-12-24 DIAGNOSIS — J449 Chronic obstructive pulmonary disease, unspecified: Secondary | ICD-10-CM

## 2022-12-24 DIAGNOSIS — F1490 Cocaine use, unspecified, uncomplicated: Secondary | ICD-10-CM | POA: Diagnosis not present

## 2022-12-24 DIAGNOSIS — B20 Human immunodeficiency virus [HIV] disease: Secondary | ICD-10-CM

## 2022-12-24 DIAGNOSIS — J4489 Other specified chronic obstructive pulmonary disease: Secondary | ICD-10-CM | POA: Diagnosis not present

## 2022-12-24 DIAGNOSIS — Z21 Asymptomatic human immunodeficiency virus [HIV] infection status: Secondary | ICD-10-CM | POA: Insufficient documentation

## 2022-12-24 DIAGNOSIS — G40909 Epilepsy, unspecified, not intractable, without status epilepticus: Secondary | ICD-10-CM | POA: Insufficient documentation

## 2022-12-24 DIAGNOSIS — F17210 Nicotine dependence, cigarettes, uncomplicated: Secondary | ICD-10-CM | POA: Insufficient documentation

## 2022-12-24 LAB — COMPREHENSIVE METABOLIC PANEL
ALT: 9 U/L (ref 0–44)
AST: 14 U/L — ABNORMAL LOW (ref 15–41)
Albumin: 3.1 g/dL — ABNORMAL LOW (ref 3.5–5.0)
Alkaline Phosphatase: 91 U/L (ref 38–126)
Anion gap: 10 (ref 5–15)
BUN: 23 mg/dL — ABNORMAL HIGH (ref 6–20)
CO2: 28 mmol/L (ref 22–32)
Calcium: 9.3 mg/dL (ref 8.9–10.3)
Chloride: 100 mmol/L (ref 98–111)
Creatinine, Ser: 0.94 mg/dL (ref 0.44–1.00)
GFR, Estimated: >60 mL/min (ref >60)
Glucose, Bld: 104 mg/dL — ABNORMAL HIGH (ref 70–99)
Potassium: 4 mmol/L (ref 3.5–5.1)
Sodium: 138 mmol/L (ref 135–145)
Total Bilirubin: 0.3 mg/dL (ref 0.3–1.2)
Total Protein: 8.4 g/dL — ABNORMAL HIGH (ref 6.5–8.1)

## 2022-12-24 LAB — CBC WITH DIFFERENTIAL/PLATELET
Abs Immature Granulocytes: 0.06 10*3/uL (ref 0.00–0.07)
Basophils Absolute: 0.1 10*3/uL (ref 0.0–0.1)
Basophils Relative: 1 %
Eosinophils Absolute: 0.3 10*3/uL (ref 0.0–0.5)
Eosinophils Relative: 4 %
HCT: 38.7 % (ref 36.0–46.0)
Hemoglobin: 12.6 g/dL (ref 12.0–15.0)
Immature Granulocytes: 1 %
Lymphocytes Relative: 35 %
Lymphs Abs: 2.9 10*3/uL (ref 0.7–4.0)
MCH: 30.4 pg (ref 26.0–34.0)
MCHC: 32.6 g/dL (ref 30.0–36.0)
MCV: 93.5 fL (ref 80.0–100.0)
Monocytes Absolute: 0.5 10*3/uL (ref 0.1–1.0)
Monocytes Relative: 7 %
Neutro Abs: 4.3 10*3/uL (ref 1.7–7.7)
Neutrophils Relative %: 52 %
Platelets: 339 10*3/uL (ref 150–400)
RBC: 4.14 MIL/uL (ref 3.87–5.11)
RDW: 13.7 % (ref 11.5–15.5)
WBC: 8.1 10*3/uL (ref 4.0–10.5)
nRBC: 0 % (ref 0.0–0.2)

## 2022-12-24 LAB — CK: Total CK: 38 U/L (ref 38–234)

## 2022-12-24 LAB — PROCALCITONIN: Procalcitonin: <0.1 ng/mL
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
## 2022-12-25 LAB — RPR: RPR Ser Ql: NONREACTIVE

## 2022-12-25 LAB — HIV-1 RNA QUANT-NO REFLEX-BLD
HIV 1 RNA Quant: 20 copies/mL
LOG10 HIV-1 RNA: 1.301 log10copy/mL

## 2022-12-29 ENCOUNTER — Other Ambulatory Visit: Payer: Self-pay | Admitting: Nurse Practitioner

## 2022-12-29 ENCOUNTER — Other Ambulatory Visit: Payer: Self-pay | Admitting: Infectious Diseases

## 2022-12-29 DIAGNOSIS — Z8673 Personal history of transient ischemic attack (TIA), and cerebral infarction without residual deficits: Secondary | ICD-10-CM

## 2022-12-29 DIAGNOSIS — J42 Unspecified chronic bronchitis: Secondary | ICD-10-CM

## 2022-12-29 DIAGNOSIS — G629 Polyneuropathy, unspecified: Secondary | ICD-10-CM

## 2022-12-29 MED ORDER — GUAIFENESIN ER 600 MG PO TB12: 600.0000 mg | ORAL_TABLET | Freq: Two times a day (BID) | ORAL | 0 refills | Status: DC

## 2022-12-29 NOTE — Progress Notes (Signed)
COPD, smoker, cocaine use I saw her for HIV follow up on 12/24/22 when she complaiend of cough for 3 months- ordered procal, cxr, other labs CXR reported today as patchy infiltrate and bronchitis Procalcitonin< 0.1 so doubt bacterial resp infection Normal WBC Spoke to patient- she is doing better than before has cough and chest congestion unable to bring up sputum No fever or chills  Bronchitis Will send mucinex 600mg  PO bID for a few days Asked to follow up with PCP

## 2023-01-14 ENCOUNTER — Encounter: Payer: 59 | Admitting: Nurse Practitioner

## 2023-01-15 ENCOUNTER — Encounter: Payer: Self-pay | Admitting: Nurse Practitioner

## 2023-02-03 ENCOUNTER — Other Ambulatory Visit: Payer: Self-pay | Admitting: Nurse Practitioner

## 2023-02-03 DIAGNOSIS — K219 Gastro-esophageal reflux disease without esophagitis: Secondary | ICD-10-CM

## 2023-02-08 ENCOUNTER — Other Ambulatory Visit: Payer: Self-pay

## 2023-02-08 MED ORDER — BIKTARVY 50-200-25 MG PO TABS: 1.0000 | ORAL_TABLET | Freq: Every day | ORAL | 1 refills | Status: DC

## 2023-02-23 ENCOUNTER — Encounter: Payer: 59 | Attending: Physician Assistant | Admitting: Internal Medicine

## 2023-02-23 DIAGNOSIS — Z8673 Personal history of transient ischemic attack (TIA), and cerebral infarction without residual deficits: Secondary | ICD-10-CM | POA: Diagnosis not present

## 2023-02-23 DIAGNOSIS — L89619 Pressure ulcer of right heel, unspecified stage: Secondary | ICD-10-CM | POA: Diagnosis present

## 2023-02-23 DIAGNOSIS — J4489 Other specified chronic obstructive pulmonary disease: Secondary | ICD-10-CM | POA: Diagnosis not present

## 2023-02-23 DIAGNOSIS — F17210 Nicotine dependence, cigarettes, uncomplicated: Secondary | ICD-10-CM | POA: Diagnosis not present

## 2023-02-23 DIAGNOSIS — R1110 Vomiting, unspecified: Secondary | ICD-10-CM | POA: Insufficient documentation

## 2023-02-23 NOTE — Progress Notes (Signed)
[name], [name] ([id number]) [id number] Page 1 of 7 Visit Report for 02/23/2023 Allergy List Details Patient Name: Date of Service: Molly Dennis, Molly Dennis 02/23/2023 9:30 A M Medical Record Number: [id number] Patient Account Number: [id number] Date of Birth/Sex: Treating RN: 09/30/1970 (52 y.o. Freddy Finner Primary Care Korrine Sicard: Mordecai Maes Other Clinician: Referring Teren Zurcher: Treating Leilanny Fluitt/Extender: RO BSO N, MICHA EL G Self, Referral Weeks in Treatment: 0 Allergies Active Allergies No Known Allergies Allergy Notes Electronic Signature(s) Signed: 02/23/2023 4:16:14 PM By: Yevonne Pax RN Entered By: Yevonne Pax on 02/23/2023 10:08:36 -------------------------------------------------------------------------------- Arrival Information Details Patient Name: Date of Service: Molly Dennis 02/23/2023 9:30 A M Medical Record Number: [id number] Patient Account Number: [id number] Date of Birth/Sex: Treating RN: 1970/08/21 (52 y.o. Freddy Finner Primary Care Daeshaun Specht: Mordecai Maes Other Clinician: Referring Johnothan Bascomb: Treating Cameryn Schum/Extender: RO BSO N, MICHA EL G Self, Referral Weeks in Treatment: 0 Visit Information Patient Arrived: Ambulatory Arrival Time: 09:51 Accompanied By: peer Transfer Assistance: None Patient Identification Verified: Yes Secondary Verification Process Completed: Yes Patient Requires Transmission-Based Precautions: No Patient Has Alerts: Yes Patient Alerts: HIV Right ABI 1.23 06/25/22 History Since Last Visit Added or deleted any medications: No Any new allergies or adverse reactions: No Had a fall or experienced change in activities of daily living that may affect risk of falls: No Signs or symptoms of abuse/neglect since last visito No Hospitalized since last visit: No Implantable device outside of the clinic excluding cellular tissue based products placed in the center since last visit: No Electronic  Signature(s) [name], [name] ([id number]) [id number] Page 2 of 7 Signed: 02/23/2023 4:16:14 PM By: Yevonne Pax RN Entered By: Yevonne Pax on 02/23/2023 10:02:16 -------------------------------------------------------------------------------- Clinic Level of Care Assessment Details Patient Name: Date of Service: Molly Dennis 02/23/2023 9:30 A M Medical Record Number: [id number] Patient Account Number: [id number] Date of Birth/Sex: Treating RN: 1971/02/03 (52 y.o. Freddy Finner Primary Care Saydi Kobel: Mordecai Maes Other Clinician: Referring Braedan Meuth: Treating Brieann Osinski/Extender: RO BSO N, MICHA EL G Self, Referral Weeks in Treatment: 0 Clinic Level of Care Assessment Items TOOL 4 Quantity Score X- 1 0 Use when only an EandM is performed on FOLLOW-UP visit ASSESSMENTS - Nursing Assessment / Reassessment X- 1 10 Reassessment of Co-morbidities (includes updates in patient status) X- 1 5 Reassessment of Adherence to Treatment Plan ASSESSMENTS - Wound and Skin A ssessment / Reassessment X - Simple Wound Assessment / Reassessment - one wound 1 5 []  - 0 Complex Wound Assessment / Reassessment - multiple wounds []  - 0 Dermatologic / Skin Assessment (not related to wound area) ASSESSMENTS - Focused Assessment []  - 0 Circumferential Edema Measurements - multi extremities []  - 0 Nutritional Assessment / Counseling / Intervention []  - 0 Lower Extremity Assessment (monofilament, tuning fork, pulses) []  - 0 Peripheral Arterial Disease Assessment (using hand held doppler) ASSESSMENTS - Ostomy and/or Continence Assessment and Care []  - 0 Incontinence Assessment and Management []  - 0 Ostomy Care Assessment and Management (repouching, etc.) PROCESS - Coordination of Care X - Simple Patient / Family Education for ongoing care 1 15 []  - 0 Complex (extensive) Patient / Family Education for ongoing care []  - 0 Staff obtains Chiropractor, Records, T Results /  Process Orders est []  - 0 Staff telephones HHA, Nursing Homes / Clarify orders / etc []  - 0 Routine Transfer to another Facility (non-emergent condition) []  - 0 Routine Hospital Admission (non-emergent condition) []  - 0 New Admissions / Manufacturing engineer / Ordering NPWT Apligraf, etc. , []  - 0 Emergency [facility]  Admission (emergent condition) X- 1 10 Simple Discharge Coordination []  - 0 Complex (extensive) Discharge Coordination PROCESS - Special Needs []  - 0 Pediatric / Minor Patient Management [name], [name] ([id number]) [id number] Page 3 of 7 []  - 0 Isolation Patient Management []  - 0 Hearing / Language / Visual special needs []  - 0 Assessment of Community assistance (transportation, D/C planning, etc.) []  - 0 Additional assistance / Altered mentation []  - 0 Support Surface(s) Assessment (bed, cushion, seat, etc.) INTERVENTIONS - Wound Cleansing / Measurement []  - 0 Simple Wound Cleansing - one wound []  - 0 Complex Wound Cleansing - multiple wounds []  - 0 Wound Imaging (photographs - any number of wounds) []  - 0 Wound Tracing (instead of photographs) []  - 0 Simple Wound Measurement - one wound []  - 0 Complex Wound Measurement - multiple wounds INTERVENTIONS - Wound Dressings []  - 0 Small Wound Dressing one or multiple wounds []  - 0 Medium Wound Dressing one or multiple wounds []  - 0 Large Wound Dressing one or multiple wounds []  - 0 Application of Medications - topical []  - 0 Application of Medications - injection INTERVENTIONS - Miscellaneous []  - 0 External ear exam []  - 0 Specimen Collection (cultures, biopsies, blood, body fluids, etc.) []  - 0 Specimen(s) / Culture(s) sent or taken to Lab for analysis []  - 0 Patient Transfer (multiple staff / Nurse, adult / Similar devices) []  - 0 Simple Staple / Suture removal (25 or less) []  - 0 Complex Staple / Suture removal (26 or more) []  - 0 Hypo / Hyperglycemic  Management (close monitor of Blood Glucose) []  - 0 Ankle / Brachial Index (ABI) - do not check if billed separately X- 1 5 Vital Signs Has the patient been seen at the hospital within the last three years: Yes Total Score: 50 Level Of Care: New/Established - Level 2 Electronic Signature(s) Signed: 02/23/2023 4:16:14 PM By: Yevonne Pax RN Entered By: Yevonne Pax on 02/23/2023 11:17:08 -------------------------------------------------------------------------------- Encounter Discharge Information Details Patient Name: Date of Service: Molly Dennis 02/23/2023 9:30 A M Medical Record Number: [id number] Patient Account Number: [id number] Date of Birth/Sex: Treating RN: 05/18/1971 (52 y.o. Freddy Finner [place], [place] ([id number]) [id number] Page 4 of 7 Primary Care Amira Podolak: Mordecai Maes Other Clinician: Referring Jaimie Redditt: Treating Aura Bibby/Extender: RO BSO N, MICHA EL G Self, Referral Weeks in Treatment: 0 Encounter Discharge Information Items Discharge Condition: Stable Ambulatory Status: Ambulatory Discharge Destination: Home Transportation: Private Auto Accompanied By: peer Schedule Follow-up Appointment: No Clinical Summary of Care: Electronic Signature(s) Signed: 02/23/2023 11:19:45 AM By: Yevonne Pax RN Entered By: Yevonne Pax on 02/23/2023 11:19:45 -------------------------------------------------------------------------------- Lower Extremity Assessment Details Patient Name: Date of Service: JOZLYN, SCHATZ 02/23/2023 9:30 A M Medical Record Number: [id number] Patient Account Number: [id number] Date of Birth/Sex: Treating RN: 12-08-1970 (52 y.o. Freddy Finner Primary Care Loriana Samad: Mordecai Maes Other Clinician: Referring Cyntha Brickman: Treating Isahi Godwin/Extender: RO BSO N, MICHA EL G Self, Referral Weeks in Treatment: 0 Edema Assessment Assessed: [Left: No] [Right: No] Edema: [Left: N] [Right: o] Calf Left: Right: Point of  Measurement: 35 cm From Medial Instep 31 cm Ankle Left: Right: Point of Measurement: 10 cm From Medial Instep 21 cm Knee To Floor Left: Right: From Medial Instep 44 cm Vascular Assessment Pulses: Dorsalis Pedis Palpable: [Right:Yes] Electronic Signature(s) Signed: 02/23/2023 4:16:14 PM By: Yevonne Pax RN Entered By: Yevonne Pax on 02/23/2023 10:08:29 Rosezetta Schlatter ([id number]) [id number] Page 5 of 7 -------------------------------------------------------------------------------- Multi Wound Chart Details Patient Name: Date of Service: GREIDY, SHERARD 02/23/2023 9:30  A M Medical Record Number: [id number] Patient Account Number: [id number] Date of Birth/Sex: Treating RN: 23-Jul-1971 (52 y.o. Freddy Finner Primary Care Adaly Puder: Mordecai Maes Other Clinician: Referring Sosie Gato: Treating Kiya Eno/Extender: RO BSO N, MICHA EL G Self, Referral Weeks in Treatment: 0 Vital Signs Height(in): 65 Pulse(bpm): 82 Weight(lbs): 150 Blood Pressure(mmHg): 104/43 Body Mass Index(BMI): 25 Temperature(F): 97.9 Respiratory Rate(breaths/min): 18 [Treatment Notes:Wound Assessments Treatment Notes] Electronic Signature(s) Signed: 02/23/2023 11:15:40 AM By: Yevonne Pax RN Entered By: Yevonne Pax on 02/23/2023 11:15:39 -------------------------------------------------------------------------------- Multi-Disciplinary Care Plan Details Patient Name: Date of Service: AUNNA, SNOOKS 02/23/2023 9:30 A M Medical Record Number: [id number] Patient Account Number: [id number] Date of Birth/Sex: Treating RN: 01/30/1971 (52 y.o. Freddy Finner Primary Care Taneya Conkel: Mordecai Maes Other Clinician: Referring Ikenna Ohms: Treating Aikam Hellickson/Extender: RO BSO Dorris Carnes, MICHA EL G Self, Referral Weeks in Treatment: 0 Active Inactive Electronic Signature(s) Signed: 02/23/2023 11:17:45 AM By: Yevonne Pax RN Entered By: Yevonne Pax on 02/23/2023 11:17:45 Rosezetta Schlatter ([id number])  [id number] Page 6 of 7 -------------------------------------------------------------------------------- Pain Assessment Details Patient Name: Date of Service: CYSTAL, SHANNAHAN 02/23/2023 9:30 A M Medical Record Number: [id number] Patient Account Number: [id number] Date of Birth/Sex: Treating RN: Feb 25, 1971 (52 y.o. Freddy Finner Primary Care Zeki Bedrosian: Mordecai Maes Other Clinician: Referring Fey Coghill: Treating Jeanna Giuffre/Extender: RO BSO N, MICHA EL G Self, Referral Weeks in Treatment: 0 Active Problems Location of Pain Severity and Description of Pain Patient Has Paino No Site Locations Pain Management and Medication Current Pain Management: Electronic Signature(s) Signed: 02/23/2023 4:16:14 PM By: Yevonne Pax RN Entered By: Yevonne Pax on 02/23/2023 09:54:39 -------------------------------------------------------------------------------- Patient/Caregiver Education Details Patient Name: Date of Service: Molly Dennis 7/23/2024andnbsp9:30 A M Medical Record Number: [id number] Patient Account Number: [id number] Date of Birth/Gender: Treating RN: 22-Sep-1970 (52 y.o. Freddy Finner Primary Care Physician: Mordecai Maes Other Clinician: Referring Physician: Treating Physician/Extender: Gwendolyn Lima Self, Referral Weeks in Treatment: 0 [name], [name] ([id number]) [phone].pdf Page 7 of 7 Education Assessment Education Provided To: Patient Education Topics Provided Pressure: [facility]New [facility] Packet: o Handouts: Welcome [facility] o Methods: Explain/Verbal Responses: State content correctly Electronic Signature(s) Signed: 02/23/2023 4:16:14 PM By: Yevonne Pax RN Entered By: Yevonne Pax on 02/23/2023 11:18:48 -------------------------------------------------------------------------------- Vitals Details Patient Name: Date of Service: Molly Dennis 02/23/2023 9:30 A  M Medical Record Number: [id number] Patient Account Number: [id number] Date of Birth/Sex: Treating RN: Dec 04, 1970 (52 y.o. Freddy Finner Primary Care Jaggar Benko: Mordecai Maes Other Clinician: Referring Dhiya Smits: Treating Akaash Vandewater/Extender: RO BSO N, MICHA EL G Self, Referral Weeks in Treatment: 0 Vital Signs Time Taken: 09:54 Temperature (F): 97.9 Height (in): 65 Pulse (bpm): 82 Source: Stated Respiratory Rate (breaths/min): 18 Weight (lbs): 150 Blood Pressure (mmHg): 104/43 Source: Stated Reference Range: 80 - 120 mg / dl Body Mass Index (BMI): 25 Electronic Signature(s) Signed: 02/23/2023 4:16:14 PM By: Yevonne Pax RN Entered By: Yevonne Pax on 02/23/2023 09:55:16
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
## 2023-02-23 NOTE — Progress Notes (Signed)
Molly, Dennis ([id number]) [id number] Page 1 of 5 Visit Report for 02/23/2023 Abuse Risk Screen Details Patient Name: Date of Service: Molly Dennis, Molly Dennis 02/23/2023 9:30 A M Medical Record Number: [id number] Patient Account Number: [id number] Date of Birth/Sex: Treating RN: Jul 03, 1971 (52 y.o. Freddy Finner Primary Care Beldon Nowling: Mordecai Maes Other Clinician: Referring Rico Massar: Treating Bertin Inabinet/Extender: RO BSO N, MICHA EL G Self, Referral Weeks in Treatment: 0 Abuse Risk Screen Items Answer ABUSE RISK SCREEN: Has anyone close to you tried to hurt or harm you recentlyo No Do you feel uncomfortable with anyone in your familyo No Has anyone forced you do things that you didnt want to doo No Electronic Signature(s) Signed: 02/23/2023 4:16:14 PM By: Yevonne Pax RN Entered By: Yevonne Pax on 02/23/2023 09:58:38 -------------------------------------------------------------------------------- Activities of Daily Living Details Patient Name: Date of Service: Molly, Dennis 02/23/2023 9:30 A M Medical Record Number: [id number] Patient Account Number: [id number] Date of Birth/Sex: Treating RN: [birth year] (52 y.o. Freddy Finner Primary Care Trapper Meech: Mordecai Maes Other Clinician: Referring Lurena Naeve: Treating Onelia Cadmus/Extender: RO BSO N, MICHA EL G Self, Referral Weeks in Treatment: 0 Activities of Daily Living Items Answer Activities of Daily Living (Please select one for each item) Drive Automobile Not Able T Medications ake Completely Able Use T elephone Completely Able Care for Appearance Completely Able Use T oilet Completely Able Bath / Shower Completely Able Dress Self Completely Able Feed Self Completely Able Walk Completely Able Get In / Out Bed Completely Able Housework Completely Molly, Dennis ([id number]) [phone] [id number] Page 2 of 5 Prepare Meals Completely Able Handle Money Completely  Able Shop for Self Completely Able Electronic Signature(s) Signed: 02/23/2023 4:16:14 PM By: Yevonne Pax RN Entered By: Yevonne Pax on 02/23/2023 09:59:28 -------------------------------------------------------------------------------- Education Screening Details Patient Name: Date of Service: Molly, Dennis 02/23/2023 9:30 A M Medical Record Number: [id number] Patient Account Number: [id number] Date of Birth/Sex: Treating RN: 20-Nov-1970 (52 y.o. Freddy Finner Primary Care Stefanny Pieri: Mordecai Maes Other Clinician: Referring Cary Lothrop: Treating Rafi Kenneth/Extender: RO BSO N, MICHA EL G Self, Referral Weeks in Treatment: 0 Primary Learner Assessed: Patient Learning Preferences/Education Level/Primary Language Learning Preference: Explanation Highest Education Level: High School Preferred Language: English Cognitive Barrier Language Barrier: No Translator Needed: No Memory Deficit: No Emotional Barrier: No Cultural/Religious Beliefs Affecting Medical Care: No Physical Barrier Impaired Vision: No Impaired Hearing: No Decreased Hand dexterity: No Knowledge/Comprehension Knowledge Level: Medium Comprehension Level: Medium Ability to understand written instructions: Medium Ability to understand verbal instructions: Medium Motivation Anxiety Level: Anxious Cooperation: Cooperative Education Importance: Acknowledges Need Interest in Health Problems: Asks Questions Perception: Coherent Willingness to Engage in Self-Management High Activities: Readiness to Engage in Self-Management High Activities: Electronic Signature(s) Signed: 02/23/2023 4:16:14 PM By: Yevonne Pax RN Entered By: Yevonne Pax on 02/23/2023 09:59:51 [name], [name] ([id number]) [id number] Page 3 of 5 -------------------------------------------------------------------------------- Fall Risk Assessment Details Patient Name: Date of Service: [name], Dennis 02/23/2023 9:30 A  M Medical Record Number: [id number] Patient Account Number: [id number] Date of Birth/Sex: Treating RN: 1970/10/08 (52 y.o. Freddy Finner Primary Care Elvira Langston: Mordecai Maes Other Clinician: Referring Antoine Fiallos: Treating Marie Chow/Extender: RO BSO N, MICHA EL G Self, Referral Weeks in Treatment: 0 Fall Risk Assessment Items Have you had 2 or more falls in the last 12 monthso 0 No Have you had any fall that resulted in injury in the last 12 monthso 0 No FALLS RISK SCREEN History of falling - immediate or within 3 months 0 No Secondary diagnosis (Do you have 2 or more  medical diagnoseso) 0 No Ambulatory aid None/bed rest/wheelchair/nurse 0 Yes Crutches/cane/walker 0 No Furniture 0 No Intravenous therapy Access/Saline/Heparin Lock 0 No Gait/Transferring Normal/ bed rest/ wheelchair 0 Yes Weak (short steps with or without shuffle, stooped but able to lift head while walking, may seek 0 No support from furniture) Impaired (short steps with shuffle, may have difficulty arising from chair, head down, impaired 0 No balance) Mental Status Oriented to own ability 0 Yes Electronic Signature(s) Signed: 02/23/2023 4:16:14 PM By: Yevonne Pax RN Entered By: Yevonne Pax on 02/23/2023 10:00:06 -------------------------------------------------------------------------------- Foot Assessment Details Patient Name: Date of Service: Molly, Dennis 02/23/2023 9:30 A M Medical Record Number: [id number] Patient Account Number: [id number] Date of Birth/Sex: Treating RN: 05/22/1971 (52 y.o. Freddy Finner Primary Care Lateisha Thurlow: Mordecai Maes Other Clinician: Referring Azariah Bonura: Treating Darrian Grzelak/Extender: RO BSO N, MICHA EL G Self, Referral Weeks in Treatment: 0 Foot Assessment Items Site Locations [place], [name] ([id number]) [id number] Page 4 of 5 + = Sensation present, - = Sensation absent, C = Callus, U = Ulcer R = Redness, W = Warmth, M = Maceration, PU =  Pre-ulcerative lesion F = Fissure, S = Swelling, D = Dryness Assessment Right: Left: Other Deformity: No No Prior Foot Ulcer: No No Prior Amputation: No No Charcot Joint: No No Ambulatory Status: Ambulatory Without Help Gait: Steady Electronic Signature(s) Signed: 02/23/2023 4:16:14 PM By: Yevonne Pax RN Entered By: Yevonne Pax on 02/23/2023 10:09:00 -------------------------------------------------------------------------------- Nutrition Risk Screening Details Patient Name: Date of Service: JEVAEH, SHAMS 02/23/2023 9:30 A M Medical Record Number: [id number] Patient Account Number: [id number] Date of Birth/Sex: Treating RN: 11/19/1970 (52 y.o. Freddy Finner Primary Care Solan Vosler: Mordecai Maes Other Clinician: Referring Schuyler Olden: Treating Kewan Mcnease/Extender: RO BSO N, MICHA EL G Self, Referral Weeks in Treatment: 0 Height (in): 65 Weight (lbs): 150 Body Mass Index (BMI): 25 Nutrition Risk Screening Items Score Screening NUTRITION RISK SCREEN: I have an illness or condition that made me change the kind and/or amount of food I eat 0 No I eat fewer than two meals per day 0 No I eat few fruits and vegetables, or milk products 0 No I have three or more drinks of beer, liquor or wine almost every day 0 No I have tooth or mouth problems that make it hard for me to eat 0 No I don't always have enough money to buy the food I need 0 No [name], Sabina ([id number]) [id number] Page 5 of 5 I eat alone most of the time 0 No I take three or more different prescribed or over-the-counter drugs a day 1 Yes Without wanting to, I have lost or gained 10 pounds in the last six months 0 No I am not always physically able to shop, cook and/or feed myself 0 No Nutrition Protocols Good Risk Protocol 0 No interventions needed Moderate Risk Protocol High Risk Proctocol Risk Level: Good Risk Score: 1 Electronic Signature(s) Signed: 02/23/2023 4:16:14 PM By:  Yevonne Pax RN Entered By: Yevonne Pax on 02/23/2023 10:00:21

## 2023-03-09 ENCOUNTER — Other Ambulatory Visit: Payer: Self-pay | Admitting: Nurse Practitioner

## 2023-03-09 NOTE — Telephone Encounter (Signed)
LAST APPOINTMENT DATE: 10/14/22   NEXT APPOINTMENT DATE: 04/28/2023    LAST REFILL: 11/30/22  QTY: #8.5g 0RF

## 2023-03-10 ENCOUNTER — Other Ambulatory Visit: Payer: Self-pay | Admitting: Nurse Practitioner

## 2023-03-11 ENCOUNTER — Other Ambulatory Visit: Payer: Self-pay | Admitting: Nurse Practitioner

## 2023-03-11 DIAGNOSIS — J42 Unspecified chronic bronchitis: Secondary | ICD-10-CM

## 2023-03-18 ENCOUNTER — Other Ambulatory Visit: Payer: Self-pay | Admitting: Nurse Practitioner

## 2023-03-18 DIAGNOSIS — Z8673 Personal history of transient ischemic attack (TIA), and cerebral infarction without residual deficits: Secondary | ICD-10-CM

## 2023-03-18 DIAGNOSIS — G629 Polyneuropathy, unspecified: Secondary | ICD-10-CM

## 2023-03-23 ENCOUNTER — Inpatient Hospital Stay: Payer: 59 | Admitting: Infectious Diseases

## 2023-04-04 ENCOUNTER — Other Ambulatory Visit: Payer: Self-pay | Admitting: Nurse Practitioner

## 2023-04-04 ENCOUNTER — Other Ambulatory Visit: Payer: Self-pay | Admitting: Infectious Diseases

## 2023-04-08 ENCOUNTER — Inpatient Hospital Stay: Payer: 59 | Admitting: Infectious Diseases

## 2023-04-15 ENCOUNTER — Inpatient Hospital Stay: Payer: 59 | Admitting: Infectious Diseases

## 2023-04-28 ENCOUNTER — Encounter: Payer: 59 | Admitting: Nurse Practitioner

## 2023-04-29 ENCOUNTER — Other Ambulatory Visit: Payer: Self-pay

## 2023-04-29 ENCOUNTER — Encounter: Payer: Self-pay | Admitting: Nurse Practitioner

## 2023-04-29 ENCOUNTER — Ambulatory Visit: Payer: 59 | Attending: Infectious Diseases | Admitting: Infectious Diseases

## 2023-04-29 ENCOUNTER — Encounter: Payer: Self-pay | Admitting: Infectious Diseases

## 2023-04-29 VITALS — BP 110/71 | HR 94 | Ht 65.0 in | Wt 141.0 lb

## 2023-04-29 DIAGNOSIS — Z23 Encounter for immunization: Secondary | ICD-10-CM | POA: Diagnosis not present

## 2023-04-29 MED ORDER — BIKTARVY 50-200-25 MG PO TABS: 1.0000 | ORAL_TABLET | Freq: Every day | ORAL | 0 refills | Status: DC

## 2023-05-04 NOTE — Progress Notes (Signed)
Pt left before seen She was vomiting and had to leave

## 2023-05-13 ENCOUNTER — Ambulatory Visit: Payer: 59 | Admitting: Infectious Diseases

## 2023-05-15 ENCOUNTER — Other Ambulatory Visit: Payer: Self-pay | Admitting: Nurse Practitioner

## 2023-05-19 ENCOUNTER — Other Ambulatory Visit: Payer: Self-pay | Admitting: Nurse Practitioner

## 2023-05-19 DIAGNOSIS — K219 Gastro-esophageal reflux disease without esophagitis: Secondary | ICD-10-CM

## 2023-05-27 ENCOUNTER — Other Ambulatory Visit: Payer: Self-pay | Admitting: Nurse Practitioner

## 2023-05-27 DIAGNOSIS — J42 Unspecified chronic bronchitis: Secondary | ICD-10-CM

## 2023-05-27 DIAGNOSIS — R569 Unspecified convulsions: Secondary | ICD-10-CM

## 2023-06-10 ENCOUNTER — Ambulatory Visit: Payer: 59 | Admitting: Infectious Diseases

## 2023-06-14 ENCOUNTER — Other Ambulatory Visit: Payer: Self-pay | Admitting: Nurse Practitioner

## 2023-06-14 DIAGNOSIS — G629 Polyneuropathy, unspecified: Secondary | ICD-10-CM

## 2023-06-15 ENCOUNTER — Other Ambulatory Visit: Payer: Self-pay | Admitting: Nurse Practitioner

## 2023-06-15 DIAGNOSIS — Z8673 Personal history of transient ischemic attack (TIA), and cerebral infarction without residual deficits: Secondary | ICD-10-CM

## 2023-06-23 ENCOUNTER — Other Ambulatory Visit: Payer: Self-pay | Admitting: Infectious Diseases

## 2023-07-13 ENCOUNTER — Other Ambulatory Visit: Payer: Self-pay | Admitting: Nurse Practitioner

## 2023-07-21 ENCOUNTER — Other Ambulatory Visit: Payer: Self-pay | Admitting: Infectious Diseases

## 2023-08-12 ENCOUNTER — Ambulatory Visit: Payer: 59 | Admitting: Infectious Diseases

## 2023-08-18 ENCOUNTER — Other Ambulatory Visit: Payer: Self-pay | Admitting: Infectious Diseases

## 2023-08-19 ENCOUNTER — Ambulatory Visit: Payer: 59 | Admitting: Infectious Diseases

## 2023-08-24 ENCOUNTER — Encounter: Payer: 59 | Admitting: Nurse Practitioner

## 2023-08-25 ENCOUNTER — Encounter: Payer: Self-pay | Admitting: Nurse Practitioner

## 2023-08-27 ENCOUNTER — Ambulatory Visit (INDEPENDENT_AMBULATORY_CARE_PROVIDER_SITE_OTHER): Payer: 59 | Admitting: Nurse Practitioner

## 2023-08-27 ENCOUNTER — Encounter: Payer: Self-pay | Admitting: Nurse Practitioner

## 2023-08-27 VITALS — BP 112/72 | HR 82 | Temp 98.5°F | Ht 64.5 in | Wt 136.5 lb

## 2023-08-27 DIAGNOSIS — Z1231 Encounter for screening mammogram for malignant neoplasm of breast: Secondary | ICD-10-CM

## 2023-08-27 DIAGNOSIS — F17200 Nicotine dependence, unspecified, uncomplicated: Secondary | ICD-10-CM | POA: Diagnosis not present

## 2023-08-27 DIAGNOSIS — M8580 Other specified disorders of bone density and structure, unspecified site: Secondary | ICD-10-CM | POA: Diagnosis not present

## 2023-08-27 DIAGNOSIS — Z122 Encounter for screening for malignant neoplasm of respiratory organs: Secondary | ICD-10-CM

## 2023-08-27 DIAGNOSIS — R7303 Prediabetes: Secondary | ICD-10-CM | POA: Diagnosis not present

## 2023-08-27 DIAGNOSIS — R569 Unspecified convulsions: Secondary | ICD-10-CM | POA: Diagnosis not present

## 2023-08-27 DIAGNOSIS — Z13220 Encounter for screening for lipoid disorders: Secondary | ICD-10-CM | POA: Diagnosis not present

## 2023-08-27 DIAGNOSIS — G629 Polyneuropathy, unspecified: Secondary | ICD-10-CM

## 2023-08-27 DIAGNOSIS — K219 Gastro-esophageal reflux disease without esophagitis: Secondary | ICD-10-CM

## 2023-08-27 DIAGNOSIS — Z8673 Personal history of transient ischemic attack (TIA), and cerebral infarction without residual deficits: Secondary | ICD-10-CM | POA: Diagnosis not present

## 2023-08-27 DIAGNOSIS — M79671 Pain in right foot: Secondary | ICD-10-CM | POA: Diagnosis not present

## 2023-08-27 DIAGNOSIS — Z0000 Encounter for general adult medical examination without abnormal findings: Secondary | ICD-10-CM

## 2023-08-27 DIAGNOSIS — Z1211 Encounter for screening for malignant neoplasm of colon: Secondary | ICD-10-CM

## 2023-08-27 DIAGNOSIS — F1490 Cocaine use, unspecified, uncomplicated: Secondary | ICD-10-CM

## 2023-08-27 DIAGNOSIS — J42 Unspecified chronic bronchitis: Secondary | ICD-10-CM | POA: Diagnosis not present

## 2023-08-27 MED ORDER — LEVETIRACETAM 500 MG PO TABS: 500.0000 mg | ORAL_TABLET | Freq: Two times a day (BID) | ORAL | 5 refills | Status: DC

## 2023-08-27 MED ORDER — FLUTICASONE-SALMETEROL 250-50 MCG/ACT IN AEPB: 1.0000 | INHALATION_SPRAY | Freq: Two times a day (BID) | RESPIRATORY_TRACT | 2 refills | Status: DC

## 2023-08-27 NOTE — Assessment & Plan Note (Signed)
History of chronic heel pain.  Patient was followed by wound clinic and plastics.  Still having difficulty with pain after wound is healed.  Ambulatory furl to podiatry today

## 2023-08-27 NOTE — Patient Instructions (Signed)
Nice to see you today I have refilled your medications Follow up with me in 3 months, sooner if you need me

## 2023-08-27 NOTE — Assessment & Plan Note (Signed)
Patient was prescribed Advair and albuterol.  She has not been using Advair as directed.  Refilled prescription today.  Patient using albuterol daily
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
## 2023-08-27 NOTE — Assessment & Plan Note (Signed)
History of same.  Patient has lost height pending DEXA scan.  Patient given information to call and set up

## 2023-08-27 NOTE — Progress Notes (Signed)
Established Patient Office Visit  Subjective   Patient ID: Molly Dennis, female    DOB: 1970-11-03  Age: 53 y.o. MRN: [id number]  Chief Complaint  Patient presents with   Medication Refill       for complete physical and follow up of chronic conditions.   HIV: on biktavy and followed by ID patient states she is adherent to therapy  COPD: states that she has not been taking her maintenance inhaler as prescribed. Albuterol every day.  Seizures: on keppra and has not had any seizures   B[facility]: Patient is followed by [facility] regional psychiatry.  Patient currently on Abilify, duloxetine, Seroquel, amitriptyline.  Immunizations: -Tetanus: Completed in 2022 -Influenza: 04/29/2023 -Shingles: Get at local pharmacy -Pneumonia: Completed 10/01/2022  Diet: Fair diet. 1 meal a day. Some snacks. More soda, kool aid,  Exercise: No regular exercise.  Eye exam: PRN Dental exam: needs updating   Colonoscopy: Cologuard ordered place Lung Cancer Screening: referal  Pap smear: needs updating patient deferred today   Mammogram: order placed  DEXA: need order  Sleep: got up at 830 and fell back asleep and then will get up again       Review of Systems  Constitutional:  Negative for chills and fever.  Respiratory:  Negative for shortness of breath.   Cardiovascular:  Negative for chest pain and leg swelling.  Gastrointestinal:  Negative for abdominal pain, blood in stool, constipation, diarrhea, nausea and vomiting.  Genitourinary:  Negative for dysuria and hematuria.  Musculoskeletal:  Positive for joint pain.  Neurological:  Negative for tingling and headaches.  Psychiatric/Behavioral:  Negative for hallucinations and suicidal ideas.       Objective:     BP 112/72 (BP Location: Right Arm, Patient Position: Sitting, Cuff Size: Normal)   Pulse 82   Temp 98.5 F (36.9 C) (Oral)   Ht 5' 4.5" (1.638 m)   Wt 136 lb 8 oz (61.9 kg)   LMP  (LMP Unknown)   SpO2 95%    BMI 23.07 kg/m    Physical Exam Vitals and nursing note reviewed.  Constitutional:      Appearance: Normal appearance.  HENT:     Right Ear: Tympanic membrane, ear canal and external ear normal.     Left Ear: Tympanic membrane, ear canal and external ear normal.     Mouth/Throat:     Mouth: Mucous membranes are moist.     Pharynx: Oropharynx is clear.  Eyes:     Extraocular Movements: Extraocular movements intact.     Pupils: Pupils are equal, round, and reactive to light.  Cardiovascular:     Rate and Rhythm: Normal rate and regular rhythm.     Pulses: Normal pulses.     Heart sounds: Normal heart sounds.  Pulmonary:     Effort: Pulmonary effort is normal.     Breath sounds: Normal breath sounds.  Abdominal:     General: Bowel sounds are normal. There is no distension.     Palpations: There is no mass.     Tenderness: There is no abdominal tenderness.     Hernia: No hernia is present.  Musculoskeletal:     Right lower leg: No edema.     Left lower leg: No edema.  Lymphadenopathy:     Cervical: No cervical adenopathy.  Skin:    General: Skin is warm.  Neurological:     Mental Status: She is alert. Mental status is at baseline.     Deep Tendon Reflexes:  Reflex Scores:      Bicep reflexes are 2+ on the right side and 2+ on the left side.      Patellar reflexes are 1+ on the right side and 2+ on the left side.    Comments: RUE/RLE: 5/5 strength  LUE/LLE 4/5 with flexion   Psychiatric:        Mood and Affect: Mood normal.        Behavior: Behavior normal.        Thought Content: Thought content normal.        Judgment: Judgment normal.      No results found for any visits on 08/27/23.    The ASCVD Risk score (Arnett DK, et al., 2019) failed to calculate for the following reasons:   Risk score cannot be calculated because patient has a medical history suggesting prior/existing ASCVD    Assessment & Plan:   Problem List Items Addressed This Visit        Respiratory   Chronic bronchitis (HCC)   Patient was prescribed Advair and albuterol.  She has not been using Advair as directed.  Refilled prescription today.  Patient using albuterol daily      Relevant Medications   fluticasone-salmeterol (ADVAIR) 250-50 MCG/ACT AEPB     Digestive   Gastroesophageal reflux disease   History of same.  Refill omeprazole 20 mg today        Nervous and Auditory   Neuropathy   Patient currently maintained on gabapentin 300 mg 3 times daily.  Continue medication as prescribed      Relevant Orders   CBC   Comprehensive metabolic panel   TSH     Musculoskeletal and Integument   Osteopenia   History of same.  Patient has lost height pending DEXA scan.  Patient given information to call and set up      Relevant Orders   DG Bone Density     Other   Prediabetes (Chronic)   History of same pending A1c      Relevant Orders   Hemoglobin A1c   Severe tobacco use disorder (Chronic)   Pending urine micro to rule out microscopic hematuria.  LDCT order placed today.      Relevant Orders   Urine Microscopic   History of CVA (cerebrovascular accident)   History of the same.  Patient currently on statin therapy pending lipid panel today refill statin      Relevant Orders   Lipid panel   Crack cocaine use   Patient admits to still smoking crack.  States person she is living with his and his neighbor.  Ambulatory referral to social work.  Encourage patient to stop smoking crack      Relevant Orders   AMB Referral [facility] Care Management   Seizure [facility])   History of same well-controlled on levetiracetam.  Refill provided today patient is not currently followed by neurology      Relevant Medications   levETIRAcetam (KEPPRA) 500 MG tablet   Preventative health care - Primary   Discussed age-appropriate immunizations and screening exams.  Did review patient's personal, surgical, social, family histories.  Patient is up-to-date on all  age-appropriate vaccinations she would like.  Get shingles vaccine at local pharmacy.  Ordered Cologuard today for CRC screening.  Patient declined Pap smear today for cervical cancer screening.  Order placed for DEXA and mammogram.  Patient was given information at discharge about preventative healthcare maintenance with anticipatory guidance      Relevant Orders  CBC   Comprehensive metabolic panel   TSH   Right foot pain   History of chronic heel pain.  Patient was followed by wound clinic and plastics.  Still having difficulty with pain after wound is healed.  Ambulatory furl to podiatry today      Relevant Orders   Ambulatory referral to Podiatry   Other Visit Diagnoses       Screening mammogram for breast cancer       Relevant Orders   MM 3D SCREENING MAMMOGRAM BILATERAL BREAST     Screening for lung cancer         Screening for colon cancer       Relevant Orders   Cologuard   Ambulatory Referral Lung Cancer Screening [facility]       Return in about 3 months (around 11/25/2023) for COPD/GERD.    Audria Nine, NP

## 2023-08-27 NOTE — Assessment & Plan Note (Signed)
Pending urine micro to rule out microscopic hematuria.  LDCT order placed today.

## 2023-08-27 NOTE — Assessment & Plan Note (Signed)
History of same.  Refill omeprazole 20 mg today

## 2023-08-27 NOTE — Assessment & Plan Note (Signed)
History of same pending A1c

## 2023-08-27 NOTE — Assessment & Plan Note (Signed)
History of the same.  Patient currently on statin therapy pending lipid panel today refill statin

## 2023-08-27 NOTE — Assessment & Plan Note (Signed)
Patient currently maintained on gabapentin 300 mg 3 times daily.  Continue medication as prescribed

## 2023-08-27 NOTE — Assessment & Plan Note (Signed)
History of same well-controlled on levetiracetam.  Refill provided today patient is not currently followed by neurology

## 2023-08-27 NOTE — Assessment & Plan Note (Signed)
Discussed age-appropriate immunizations and screening exams.  Did review patient's personal, surgical, social, family histories.  Patient is up-to-date on all age-appropriate vaccinations she would like.  Get shingles vaccine at local pharmacy.  Ordered Cologuard today for CRC screening.  Patient declined Pap smear today for cervical cancer screening.  Order placed for DEXA and mammogram.  Patient was given information at discharge about preventative healthcare maintenance with anticipatory guidance

## 2023-08-28 LAB — LIPID PANEL
Cholesterol: 216 mg/dL — ABNORMAL HIGH (ref <200)
HDL: 78 mg/dL (ref >=50)
LDL Cholesterol (Calc): 119 mg/dL — ABNORMAL HIGH
Non-HDL Cholesterol (Calc): 138 mg/dL — ABNORMAL HIGH (ref <130)
Total CHOL/HDL Ratio: 2.8 (calc) (ref <5.0)
Triglycerides: 87 mg/dL (ref <150)

## 2023-08-28 LAB — COMPREHENSIVE METABOLIC PANEL
AG Ratio: 1 (calc) (ref 1.0–2.5)
ALT: 9 U/L (ref 6–29)
AST: 13 U/L (ref 10–35)
Albumin: 3.8 g/dL (ref 3.6–5.1)
Alkaline phosphatase (APISO): 80 U/L (ref 37–153)
BUN: 19 mg/dL (ref 7–25)
CO2: 22 mmol/L (ref 20–32)
Calcium: 9.7 mg/dL (ref 8.6–10.4)
Chloride: 104 mmol/L (ref 98–110)
Creat: 0.94 mg/dL (ref 0.50–1.03)
Globulin: 3.7 g/dL (ref 1.9–3.7)
Glucose, Bld: 112 mg/dL — ABNORMAL HIGH (ref 65–99)
Potassium: 4 mmol/L (ref 3.5–5.3)
Sodium: 145 mmol/L (ref 135–146)
Total Bilirubin: 0.2 mg/dL (ref 0.2–1.2)
Total Protein: 7.5 g/dL (ref 6.1–8.1)

## 2023-08-28 LAB — CBC
HCT: 39.2 % (ref 35.0–45.0)
Hemoglobin: 13.4 g/dL (ref 11.7–15.5)
MCH: 31.7 pg (ref 27.0–33.0)
MCHC: 34.2 g/dL (ref 32.0–36.0)
MCV: 92.7 fL (ref 80.0–100.0)
MPV: 9.5 fL (ref 7.5–12.5)
Platelets: 372 10*3/uL (ref 140–400)
RBC: 4.23 10*6/uL (ref 3.80–5.10)
RDW: 13 % (ref 11.0–15.0)
WBC: 5.1 10*3/uL (ref 3.8–10.8)

## 2023-08-28 LAB — HEMOGLOBIN A1C
Hgb A1c MFr Bld: 5.6 %{Hb} (ref <5.7)
Mean Plasma Glucose: 114 mg/dL
eAG (mmol/L): 6.3 mmol/L

## 2023-08-28 LAB — TSH: TSH: 1.85 m[IU]/L

## 2023-08-30 ENCOUNTER — Telehealth: Payer: Self-pay | Admitting: *Deleted

## 2023-08-30 NOTE — Progress Notes (Signed)
Complex Care Management Note  Care Guide Note 08/30/2023 Name: Molly Dennis MRN: [id number] DOB: 05-06-1971  Molly Dennis is a 53 y.o. year old female who sees Cable, Genene Churn, NP for primary care. I reached out to Rosezetta Schlatter by phone today to offer complex care management services.  Ms. Dapolito was given information about Complex Care Management services today including:   The Complex Care Management services include support from the care team which includes your Nurse Coordinator, Clinical Social Worker, or Pharmacist.  The Complex Care Management team is here to help remove barriers to the health concerns and goals most important to you. Complex Care Management services are voluntary, and the patient may decline or stop services at any time by request to their care team member.   Complex Care Management Consent Status: Patient agreed to services and verbal consent obtained.   Follow up plan:  Telephone appointment with complex care management team member scheduled for:  09/02/2023  Encounter Outcome:  Patient Scheduled  Burman Nieves, CMA, Care Guide [facility]  [facility], [facility] Guide Direct Dial: [phone]  Fax: [phone] Website: [email or web address]

## 2023-08-31 ENCOUNTER — Encounter: Payer: Self-pay | Admitting: Nurse Practitioner

## 2023-09-01 ENCOUNTER — Other Ambulatory Visit: Payer: Self-pay | Admitting: Nurse Practitioner

## 2023-09-01 ENCOUNTER — Telehealth: Payer: Self-pay

## 2023-09-01 NOTE — Telephone Encounter (Signed)
Contacted pt to receive clarification regarding shingles vaccine.  Pt states she needs a prescription.  Advised to pt that she qualifies for the vaccine because she is in the age range for this vaccine.  Pt verbalized understanding and stated that she will contact pharmacy to let them know.

## 2023-09-01 NOTE — Telephone Encounter (Signed)
Patient is 53 years of age and should not require a prescription for the shingles vaccine

## 2023-09-01 NOTE — Telephone Encounter (Signed)
Requesting: ARIPiprazole (ABILIFY) 10 MG tablet  Last Visit: 08/27/2023 Next Visit: Visit date not found Last Refill: 08/29/2021 by Historical Provider  Please Advise

## 2023-09-01 NOTE — Telephone Encounter (Signed)
Copied from CRM [id number]. Topic: Clinical - Prescription Issue >> Sep 01, 2023  1:19 PM Pascal Lux wrote: Reason for CRM: Patient stated that her provider was supposed to send over a prescription for shingles vaccine to her pharmacy and they have not received it.

## 2023-09-01 NOTE — Telephone Encounter (Unsigned)
Copied from CRM [id number]. Topic: Clinical - Medication Refill >> Sep 01, 2023  3:32 PM Truddie Crumble wrote: Most Recent Primary Care Visit:  Provider: Eden Emms  Department: [facility]  Visit Type: OFFICE VISIT  Date: 08/27/2023  Medication: (ABILIFY  Has the patient contacted their pharmacy? Yes (Agent: If no, request that the patient contact the pharmacy for the refill. If patient does not wish to contact the pharmacy document the reason why and proceed with request.) (Agent: If yes, when and what did the pharmacy advise?)  Is this the correct pharmacy for this prescription? Yes If no, delete pharmacy and type the correct one.  This is the patient's preferred pharmacy:  [facility] - [place], [place] - [address] [place] [address] Phone: [phone] Fax: [phone]   Has the prescription been filled recently? No  Is the patient out of the medication? Yes  Has the patient been seen for an appointment in the last year OR does the patient have an upcoming appointment? Yes  Can we respond through MyChart? No  Agent: Please be advised that Rx refills may take up to 3 business days. We ask that you follow-up with your pharmacy.

## 2023-09-01 NOTE — Telephone Encounter (Signed)
Copied from CRM [id number]. Topic: Clinical - Medication Refill >> Sep 01, 2023  3:32 PM Truddie Crumble wrote: Most Recent Primary Care Visit:  Provider: Eden Emms  Department: [facility]  Visit Type: OFFICE VISIT  Date: 08/27/2023  Medication: (ABILIFY  Has the patient contacted their pharmacy? Yes (Agent: If no, request that the patient contact the pharmacy for the refill. If patient does not wish to contact the pharmacy document the reason why and proceed with request.) (Agent: If yes, when and what did the pharmacy advise?)  Is this the correct pharmacy for this prescription? Yes If no, delete pharmacy and type the correct one.  This is the patient's preferred pharmacy:  [facility] - [place], [place] - [address] [place] [address] Phone: [phone] Fax: [phone]   Has the prescription been filled recently? No  Is the patient out of the medication? Yes  Has the patient been seen for an appointment in the last year OR does the patient have an upcoming appointment? Yes  Can we respond through MyChart? No  Agent: Please be advised that Rx refills may take up to 3 business days. We ask that you follow-up with your pharmacy.

## 2023-09-02 ENCOUNTER — Encounter: Payer: 59 | Admitting: *Deleted

## 2023-09-02 ENCOUNTER — Telehealth: Payer: Self-pay | Admitting: *Deleted

## 2023-09-02 NOTE — Telephone Encounter (Signed)
I called and spoke with patient and notified her of denial of refill of Abilify

## 2023-09-02 NOTE — Patient Outreach (Addendum)
  Care Coordination   09/02/2023 Name: Molly Dennis MRN: [id number] DOB: [birth year]   Care Coordination Outreach Attempts:  An unsuccessful telephone outreach was attempted today to offer the patient information about available complex care management services. Patient answered, however requested a call at another time.  Follow Up Plan:  Additional outreach attempts will be made to offer the patient complex care management information and services.   Encounter Outcome:  Patient Request to Call Back   Care Coordination Interventions:  No, not indicated     Classie Weng, LCSW [place]  [facility], [facility] Licensed Clinical Social Worker Care Coordinator  Direct Dial: [phone]

## 2023-09-06 ENCOUNTER — Telehealth: Payer: Self-pay | Admitting: *Deleted

## 2023-09-06 NOTE — Progress Notes (Unsigned)
Complex Care Management Care Guide Note  09/06/2023 Name: [name] MRN: [id number] DOB: Dec 10, 1970  Molly Dennis is a 53 y.o. year old female who is a primary care patient of Eden Emms, NP and is actively engaged with the care management team. I reached out to [employer] by phone today to assist with re-scheduling  with the Licensed Clinical SW.  Follow up plan: Unsuccessful telephone outreach attempt made. A HIPAA compliant phone message was left for the patient providing contact information and requesting a return call.  Burman Nieves, CMA, Care Guide [facility]  [facility], [facility] Guide Direct Dial: [phone]  Fax: [phone] Website: [email or web address]

## 2023-09-07 ENCOUNTER — Other Ambulatory Visit: Payer: Self-pay | Admitting: Nurse Practitioner

## 2023-09-07 DIAGNOSIS — K219 Gastro-esophageal reflux disease without esophagitis: Secondary | ICD-10-CM

## 2023-09-07 DIAGNOSIS — Z8673 Personal history of transient ischemic attack (TIA), and cerebral infarction without residual deficits: Secondary | ICD-10-CM

## 2023-09-07 DIAGNOSIS — G629 Polyneuropathy, unspecified: Secondary | ICD-10-CM

## 2023-09-07 NOTE — Progress Notes (Signed)
 Complex Care Management Care Guide Note  09/07/2023 Name: [name] MRN: [id number] DOB: Oct 20, 1970  Molly Dennis is a 53 y.o. year old female who is a primary care patient of Wendee Lynwood HERO, NP and is actively engaged with the care management team. I reached out to Ellouise Michaels by phone today to assist with re-scheduling  with the Licensed Clinical Child Psychotherapist.  Follow up plan: unsuccessful telephone outreach attempt made. A HIPAA compliant phone message was left for the patient providing contact information and requesting a return call. No additional outreach attempts will be made due to inability to maintain patient contact.   Thedford Franks, CMA, Care Guide [facility]  [facility], [facility] Guide Direct Dial: [phone]  Fax: [phone] Website: [email or web address]

## 2023-09-14 ENCOUNTER — Ambulatory Visit: Payer: 59 | Admitting: Infectious Diseases

## 2023-09-16 ENCOUNTER — Telehealth: Payer: Self-pay | Admitting: *Deleted

## 2023-09-16 ENCOUNTER — Telehealth: Payer: Self-pay

## 2023-09-16 NOTE — Telephone Encounter (Signed)
Attempted to call patient to check on how she is feeling. Not able to reach her at this time. Left voicemail requesting call back. Also left message for pt to call and schedule appt with social worker. Will send text message to patient.  Juanita Laster, RMA

## 2023-09-16 NOTE — Telephone Encounter (Signed)
Scheduling team transferred patient call to me due to difficulty understanding patient. During call patient voice was noticeably slurred.  Pt called to confirm upcoming appt and request refills. Provided patient with appointment info. Requested she keep this appt to discuss refills with provider.  Transportation is a barrier for patient, does not have contact info for her case Production designer, theatre/television/film.  Provider present during call who asked patient if there was reason for increased slurred  speech.  She states that she is taking drugs, but did not specify what she was taking. Is with her boyfriend, but did not want [place] to speak with him.  Reached out to [employer], CMA, Care Guide who states pt was scheduled to speak with [facility], but calls not successful.  Staci will reach out to patient today to schedule appt with social worker.  Will call patient later today for follow up. Juanita Laster, RMA

## 2023-09-16 NOTE — Progress Notes (Signed)
Complex Care Management Care Guide Note  09/16/2023 Name: Beatryce Colombo MRN: [id number] DOB: 01-29-1971  Miyanna Wiersma is a 53 y.o. year old female who is a primary care patient of Eden Emms, NP and is actively engaged with the care management team. I reached out to Rosezetta Schlatter by phone today to assist with re-scheduling  with the Licensed Clinical Child psychotherapist.  Follow up plan: Unsuccessful telephone outreach attempt made. A HIPAA compliant phone message was left for the patient providing contact information and requesting a return call.  Burman Nieves, CMA, Care Guide [facility]  [facility], [facility] Guide Direct Dial: [phone]  Fax: [phone] Website: [email or web address]

## 2023-09-17 NOTE — Progress Notes (Signed)
Complex Care Management Care Guide Note  09/17/2023 Name: Marshawn Ninneman MRN: [id number] DOB: 1971-05-22  Braylinn Gulden is a 53 y.o. year old female who is a primary care patient of Eden Emms, NP and is actively engaged with the care management team. I reached out to Rosezetta Schlatter by phone today to assist with re-scheduling  with the Licensed Clinical Child psychotherapist.  Follow up plan: Unsuccessful telephone outreach attempt made. A HIPAA compliant phone message was left for the patient providing contact information and requesting a return call.  Burman Nieves, CMA, Care Guide [facility]  [facility], [facility] Guide Direct Dial: [phone]  Fax: [phone] Website: [email or web address]

## 2023-09-20 ENCOUNTER — Other Ambulatory Visit: Payer: Self-pay | Admitting: Infectious Diseases

## 2023-09-20 ENCOUNTER — Other Ambulatory Visit: Admit: 2023-09-20 | Payer: 59

## 2023-09-20 DIAGNOSIS — B20 Human immunodeficiency virus [HIV] disease: Secondary | ICD-10-CM

## 2023-09-20 NOTE — Progress Notes (Signed)
 Complex Care Management Care Guide Note  09/20/2023 Name: Molly Dennis MRN: [id number] DOB: July 21, 1971  Molly Dennis is a 53 y.o. year old female who is a primary care patient of Eden Emms, NP and is actively engaged with the care management team. I reached out to Rosezetta Schlatter by phone today to assist with re-scheduling  with the Licensed Clinical Child psychotherapist.  Follow up plan: we have been unable to reach pt by phone to reschedule with Licensed Clinical SW   Burman Nieves, CMA, Care Guide [facility], [facility] Guide Direct Dial: [phone]  Fax: [phone] Website: [email or web address]

## 2023-09-21 ENCOUNTER — Other Ambulatory Visit
Admission: RE | Admit: 2023-09-21 | Discharge: 2023-09-21 | Disposition: A | Discharge status: Home / Self Care | Payer: 59 | Source: Ambulatory Visit | Attending: Infectious Diseases | Admitting: Infectious Diseases

## 2023-09-21 ENCOUNTER — Other Ambulatory Visit: Payer: Self-pay | Admitting: Infectious Diseases

## 2023-09-21 ENCOUNTER — Ambulatory Visit: Payer: 59 | Admitting: Podiatry

## 2023-09-21 DIAGNOSIS — B20 Human immunodeficiency virus [HIV] disease: Secondary | ICD-10-CM | POA: Diagnosis present

## 2023-09-22 ENCOUNTER — Other Ambulatory Visit: Payer: Self-pay | Admitting: Nurse Practitioner

## 2023-09-22 LAB — RPR: RPR Ser Ql: NONREACTIVE

## 2023-09-23 ENCOUNTER — Ambulatory Visit: Payer: 59 | Admitting: Infectious Diseases

## 2023-09-24 ENCOUNTER — Telehealth: Payer: Self-pay

## 2023-09-24 NOTE — Telephone Encounter (Signed)
 Copied from CRM [id number]. Topic: Clinical - Medication Question >> Sep 24, 2023  2:26 PM Corin V wrote: Reason for CRM: Patient has questions about her shingles vaccine medication.

## 2023-09-24 NOTE — Telephone Encounter (Signed)
 Called patient it was very loud where she was she asked that we call her back next week. Patient states it is not urgent at all.

## 2023-09-27 NOTE — Telephone Encounter (Signed)
 Unable to reach patient. Left voicemail to return call to our office.

## 2023-09-28 NOTE — Telephone Encounter (Signed)
 Returned pt call. Pt had a question about whether or not she is able to take her Biktarvy while getting shingles shot.  Advised pt to reach out to her Infectious Disease clinic in regards to [facility], as PCP does not prescribe medication.

## 2023-10-12 ENCOUNTER — Other Ambulatory Visit: Payer: Self-pay | Admitting: Nurse Practitioner

## 2023-10-12 ENCOUNTER — Other Ambulatory Visit: Payer: Self-pay | Admitting: Infectious Diseases

## 2023-10-12 ENCOUNTER — Ambulatory Visit: Payer: 59 | Admitting: Podiatry

## 2023-10-14 NOTE — Telephone Encounter (Signed)
 Pt has appt 10/2023

## 2023-10-21 ENCOUNTER — Ambulatory Visit: Payer: 59 | Admitting: Infectious Diseases

## 2023-10-28 ENCOUNTER — Telehealth: Payer: Self-pay

## 2023-10-28 NOTE — Telephone Encounter (Signed)
 See referral notes for updated call.

## 2023-11-09 ENCOUNTER — Other Ambulatory Visit: Payer: Self-pay | Admitting: Nurse Practitioner

## 2023-11-09 ENCOUNTER — Ambulatory Visit: Admitting: Infectious Diseases

## 2023-11-09 ENCOUNTER — Other Ambulatory Visit: Payer: Self-pay | Admitting: Infectious Diseases

## 2023-11-09 DIAGNOSIS — G629 Polyneuropathy, unspecified: Secondary | ICD-10-CM

## 2023-11-09 NOTE — Telephone Encounter (Signed)
 Copied from CRM [id number]. Topic: Clinical - Medication Refill >> Nov 09, 2023  5:02 PM [place] J wrote: Most Recent Primary Care Visit:  Provider: Eden Emms  Department: [facility]  Visit Type: OFFICE VISIT  Date: 08/27/2023  Medication:  gabapentin (NEURONTIN) 300 MG BIKTARVY 50-200-25 MG TABS albuterol (VENTOLIN HFA) 108 (90 Base) MCG/ACT inhaler  Has the patient contacted their pharmacy? Yes (Agent: If no, request that the patient contact the pharmacy for the refill. If patient does not wish to contact the pharmacy document the reason why and proceed with request.) (Agent: If yes, when and what did the pharmacy advise?)  Is this the correct pharmacy for this prescription? Yes If no, delete pharmacy and type the correct one.  This is the patient's preferred pharmacy:  [facility] - [place], [place] - [address] [place] [address] Phone: [phone] Fax: [phone]   Has the prescription been filled recently? No  Is the patient out of the medication? No  Has the patient been seen for an appointment in the last year OR does the patient have an upcoming appointment? Yes  Can we respond through MyChart? Yes  Agent: Please be advised that Rx refills may take up to 3 business days. We ask that you follow-up with your pharmacy.

## 2023-11-10 ENCOUNTER — Ambulatory Visit: Admitting: Nurse Practitioner

## 2023-11-10 MED ORDER — ALBUTEROL SULFATE HFA 108 (90 BASE) MCG/ACT IN AERS
INHALATION_SPRAY | RESPIRATORY_TRACT | 0 refills | Status: DC
Start: 2023-11-10 — End: 2023-12-09

## 2023-11-10 MED ORDER — GABAPENTIN 300 MG PO CAPS: 300.0000 mg | ORAL_CAPSULE | Freq: Three times a day (TID) | ORAL | 2 refills | Status: DC

## 2023-11-17 ENCOUNTER — Telehealth: Payer: Self-pay | Admitting: Acute Care

## 2023-11-17 DIAGNOSIS — F17210 Nicotine dependence, cigarettes, uncomplicated: Secondary | ICD-10-CM

## 2023-11-17 DIAGNOSIS — Z122 Encounter for screening for malignant neoplasm of respiratory organs: Secondary | ICD-10-CM

## 2023-11-17 DIAGNOSIS — Z87891 Personal history of nicotine dependence: Secondary | ICD-10-CM

## 2023-11-17 NOTE — Telephone Encounter (Signed)
 Lung Cancer Screening Narrative/Criteria Questionnaire (Cigarette Smokers Only- No Cigars/Pipes/vapes)   Molly Dennis   SDMV:12/17/2023 at 11:30 with [name]        04/03/1971   LDCT: 12/21/2023 at 1:00pm at [facility]    53 y.o.   Phone: [phone]  Lung Screening Narrative (confirm age 86-77 yrs Medicare / 50-80 yrs Private pay insurance)   Insurance information:[employer] mcr/mcd   Referring Provider:James Cable NP   This screening involves an initial phone call with a team member from our program. It is called a shared decision making visit. The initial meeting is required by  insurance and Medicare to make sure you understand the program. This appointment takes about 15-20 minutes to complete. You will complete the screening scan at your scheduled date/time.  This scan takes about 5-10 minutes to complete. You can eat and drink normally before and after the scan.  Criteria questions for Lung Cancer Screening:   Are you a current or former smoker? Current Age began smoking: 53yo   If you are a former smoker, what year did you quit smoking? N/A(within 15 yrs)   To calculate your smoking history, I need an accurate estimate of how many packs of cigarettes you smoked per day and for how many years. (Not just the number of PPD you are now smoking)   Years smoking 38 x Packs per day 1.5 = Pack years 57   (at least 20 pack yrs)   (Make sure they understand that we need to know how much they have smoked in the past, not just the number of PPD they are smoking now)  Do you have a personal history of cancer?  No    Do you have a family history of cancer? No  Are you coughing up blood?  No  Have you had unexplained weight loss of 15 lbs or more in the last 6 months? No  It looks like you meet all criteria.  When would be a good time for us  to schedule you for this screening?   Additional information: N/A

## 2023-11-18 ENCOUNTER — Ambulatory Visit: Admitting: Infectious Diseases

## 2023-11-23 ENCOUNTER — Ambulatory Visit: Admitting: Infectious Diseases

## 2023-11-25 ENCOUNTER — Ambulatory Visit: Admitting: Nurse Practitioner

## 2023-11-26 ENCOUNTER — Encounter: Payer: Self-pay | Admitting: Nurse Practitioner

## 2023-11-30 ENCOUNTER — Other Ambulatory Visit: Payer: Self-pay | Admitting: Nurse Practitioner

## 2023-11-30 DIAGNOSIS — Z8673 Personal history of transient ischemic attack (TIA), and cerebral infarction without residual deficits: Secondary | ICD-10-CM

## 2023-12-08 ENCOUNTER — Other Ambulatory Visit: Payer: Self-pay | Admitting: Nurse Practitioner

## 2023-12-08 DIAGNOSIS — J42 Unspecified chronic bronchitis: Secondary | ICD-10-CM

## 2023-12-17 ENCOUNTER — Ambulatory Visit: Admitting: Physician Assistant

## 2023-12-17 DIAGNOSIS — Z91199 Patient's noncompliance with other medical treatment and regimen due to unspecified reason: Secondary | ICD-10-CM

## 2023-12-17 NOTE — Progress Notes (Signed)
 Virtual Visit via Telephone Note  I was unable to reach patient for her shared decision making visit.  Patt Boozer Lilyann Gravelle, PA-C

## 2023-12-20 ENCOUNTER — Telehealth: Payer: Self-pay | Admitting: Emergency Medicine

## 2023-12-20 NOTE — Telephone Encounter (Signed)
 Copied from CRM [id number]. Topic: General - Other >> Dec 17, 2023  3:36 PM Eveleen Hinds B wrote: Reason for CRM: Patient call because she missed her "shared decision" visit today and would like to reschedule.   Called and left VM for pt.

## 2023-12-21 ENCOUNTER — Other Ambulatory Visit: Payer: Self-pay | Admitting: Nurse Practitioner

## 2023-12-21 ENCOUNTER — Ambulatory Visit: Admitting: Nurse Practitioner

## 2023-12-21 ENCOUNTER — Ambulatory Visit: Admission: RE | Admit: 2023-12-21 | Source: Ambulatory Visit

## 2023-12-24 ENCOUNTER — Other Ambulatory Visit: Payer: Self-pay | Admitting: Nurse Practitioner

## 2023-12-27 ENCOUNTER — Other Ambulatory Visit: Payer: Self-pay | Admitting: Nurse Practitioner

## 2023-12-27 DIAGNOSIS — K219 Gastro-esophageal reflux disease without esophagitis: Secondary | ICD-10-CM

## 2024-01-04 NOTE — Telephone Encounter (Signed)
 Patient called in and left VM to reschedule SDMV and LDCT. Called patient back. Patient answered and requested she call us  back later as she was shopping.

## 2024-01-05 ENCOUNTER — Other Ambulatory Visit: Payer: Self-pay | Admitting: Nurse Practitioner

## 2024-01-05 NOTE — Telephone Encounter (Unsigned)
 Copied from CRM [id number]. Topic: Clinical - Medication Refill >> Jan 05, 2024  4:14 PM Shereese L wrote: Medication::::bictegravir-emtricitabine-tenofovir AF (BIKTARVY ) 50-200-25 MG TABS tablet ARIPiprazole  (ABILIFY ) 10 MG tablet  Has the patient contacted their pharmacy? Yes (Agent: If no, request that the patient contact the pharmacy for the refill. If patient does not wish to contact the pharmacy document the reason why and proceed with request.) (Agent: If yes, when and what did the pharmacy advise?)  This is the patient's preferred pharmacy:  [facility] - [place], [place] - [address] [place] [address] Phone: [phone] Fax: [phone]  Is this the correct pharmacy for this prescription? Yes If no, delete pharmacy and type the correct one.   Has the prescription been filled recently? Yes  Is the patient out of the medication? Yes  Has the patient been seen for an appointment in the last year OR does the patient have an upcoming appointment? Yes  Can we respond through MyChart? No  Agent: Please be advised that Rx refills may take up to 3 business days. We ask that you follow-up with your pharmacy.

## 2024-01-20 ENCOUNTER — Ambulatory Visit: Admitting: Nurse Practitioner

## 2024-02-02 ENCOUNTER — Other Ambulatory Visit: Payer: Self-pay | Admitting: Nurse Practitioner

## 2024-02-14 ENCOUNTER — Ambulatory Visit (INDEPENDENT_AMBULATORY_CARE_PROVIDER_SITE_OTHER): Admitting: Adult Health

## 2024-02-14 ENCOUNTER — Encounter: Payer: Self-pay | Admitting: Adult Health

## 2024-02-14 DIAGNOSIS — F17210 Nicotine dependence, cigarettes, uncomplicated: Secondary | ICD-10-CM

## 2024-02-14 NOTE — Progress Notes (Signed)
  Virtual Visit via Telephone Note  I connected with Molly Dennis , 02/14/24 10:51 AM by a telemedicine application and verified that I am speaking with the correct person using two identifiers.  Location: Patient: home Provider: home   I discussed the limitations of evaluation and management by telemedicine and the availability of in person appointments. The patient expressed understanding and agreed to proceed.   Shared Decision Making Visit Lung Cancer Screening Program [phone])   Eligibility: 53 y.o. Pack Years Smoking History Calculation =57 pack years  (# packs/per year x # years smoked) Recent History of coughing up blood  no Unexplained weight loss? no ( >Than 15 pounds within the last 6 months ) Prior History Lung / other cancer no (Diagnosis within the last 5 years already requiring surveillance chest CT Scans). Smoking Status Current Smoker   Visit Components: Discussion included one or more decision making aids. YES Discussion included risk/benefits of screening. YES Discussion included potential follow up diagnostic testing for abnormal scans. YES Discussion included meaning and risk of over diagnosis. YES Discussion included meaning and risk of False Positives. YES Discussion included meaning of total radiation exposure. YES  Counseling Included: Importance of adherence to annual lung cancer LDCT screening. YES Impact of comorbidities on ability to participate in the program. YES Ability and willingness to under diagnostic treatment. YES  Smoking Cessation Counseling: Current Smokers:  Discussed importance of smoking cessation. yes Information about tobacco cessation classes and interventions provided to patient. yes Patient provided with ticket for LDCT Scan. yes Symptomatic Patient. NO Diagnosis Code: Tobacco Use Z72.0 Asymptomatic Patient yes  Counseling - 4 minutes of smoking cessation counseling  Z12.2-Screening of respiratory  organs Z87.891-Personal history of nicotine  dependence   Lamarr Myers 02/14/24

## 2024-02-14 NOTE — Patient Instructions (Signed)

## 2024-02-17 ENCOUNTER — Ambulatory Visit: Attending: Infectious Diseases | Admitting: Infectious Diseases

## 2024-02-17 ENCOUNTER — Encounter: Payer: Self-pay | Admitting: Infectious Diseases

## 2024-02-17 ENCOUNTER — Other Ambulatory Visit
Admission: RE | Admit: 2024-02-17 | Discharge: 2024-02-17 | Disposition: A | Discharge status: Home / Self Care | Source: Ambulatory Visit | Attending: Infectious Diseases | Admitting: Infectious Diseases

## 2024-02-17 VITALS — BP 94/63 | HR 85 | Temp 97.5°F

## 2024-02-17 DIAGNOSIS — Z21 Asymptomatic human immunodeficiency virus [HIV] infection status: Secondary | ICD-10-CM | POA: Diagnosis not present

## 2024-02-17 DIAGNOSIS — Z8673 Personal history of transient ischemic attack (TIA), and cerebral infarction without residual deficits: Secondary | ICD-10-CM | POA: Insufficient documentation

## 2024-02-17 DIAGNOSIS — F1490 Cocaine use, unspecified, uncomplicated: Secondary | ICD-10-CM | POA: Diagnosis not present

## 2024-02-17 DIAGNOSIS — G40909 Epilepsy, unspecified, not intractable, without status epilepticus: Secondary | ICD-10-CM | POA: Diagnosis not present

## 2024-02-17 DIAGNOSIS — F32A Depression, unspecified: Secondary | ICD-10-CM | POA: Diagnosis not present

## 2024-02-17 DIAGNOSIS — B20 Human immunodeficiency virus [HIV] disease: Secondary | ICD-10-CM | POA: Diagnosis present

## 2024-02-17 DIAGNOSIS — R053 Chronic cough: Secondary | ICD-10-CM | POA: Insufficient documentation

## 2024-02-17 DIAGNOSIS — F419 Anxiety disorder, unspecified: Secondary | ICD-10-CM | POA: Insufficient documentation

## 2024-02-17 DIAGNOSIS — J4489 Other specified chronic obstructive pulmonary disease: Secondary | ICD-10-CM | POA: Insufficient documentation

## 2024-02-17 DIAGNOSIS — F17210 Nicotine dependence, cigarettes, uncomplicated: Secondary | ICD-10-CM | POA: Insufficient documentation

## 2024-02-17 LAB — CBC WITH DIFFERENTIAL/PLATELET
Abs Immature Granulocytes: 0.03 K/uL (ref 0.00–0.07)
Basophils Absolute: 0 K/uL (ref 0.0–0.1)
Basophils Relative: 1 %
Eosinophils Absolute: 0.2 K/uL (ref 0.0–0.5)
Eosinophils Relative: 3 %
HCT: 35.9 % — ABNORMAL LOW (ref 36.0–46.0)
Hemoglobin: 11.8 g/dL — ABNORMAL LOW (ref 12.0–15.0)
Immature Granulocytes: 1 %
Lymphocytes Relative: 43 %
Lymphs Abs: 2.4 K/uL (ref 0.7–4.0)
MCH: 32.2 pg (ref 26.0–34.0)
MCHC: 32.9 g/dL (ref 30.0–36.0)
MCV: 98.1 fL (ref 80.0–100.0)
Monocytes Absolute: 0.6 K/uL (ref 0.1–1.0)
Monocytes Relative: 10 %
Neutro Abs: 2.3 K/uL (ref 1.7–7.7)
Neutrophils Relative %: 42 %
Platelets: 264 K/uL (ref 150–400)
RBC: 3.66 MIL/uL — ABNORMAL LOW (ref 3.87–5.11)
RDW: 13 % (ref 11.5–15.5)
WBC: 5.5 K/uL (ref 4.0–10.5)
nRBC: 0 % (ref 0.0–0.2)

## 2024-02-17 LAB — COMPREHENSIVE METABOLIC PANEL WITH GFR
ALT: 9 U/L (ref 0–44)
AST: 15 U/L (ref 15–41)
Albumin: 2.6 g/dL — ABNORMAL LOW (ref 3.5–5.0)
Alkaline Phosphatase: 64 U/L (ref 38–126)
Anion gap: 12 (ref 5–15)
BUN: 11 mg/dL (ref 6–20)
CO2: 26 mmol/L (ref 22–32)
Calcium: 8.6 mg/dL — ABNORMAL LOW (ref 8.9–10.3)
Chloride: 101 mmol/L (ref 98–111)
Creatinine, Ser: 1.06 mg/dL — ABNORMAL HIGH (ref 0.44–1.00)
GFR, Estimated: >60 mL/min (ref >60)
Glucose, Bld: 152 mg/dL — ABNORMAL HIGH (ref 70–99)
Potassium: 3.4 mmol/L — ABNORMAL LOW (ref 3.5–5.1)
Sodium: 139 mmol/L (ref 135–145)
Total Bilirubin: 0.6 mg/dL (ref 0.0–1.2)
Total Protein: 6.5 g/dL (ref 6.5–8.1)
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
## 2024-02-18 LAB — T-HELPER CELLS CD4/CD8 %
% CD 4 Pos. Lymph.: 68.9 % — ABNORMAL HIGH (ref 30.8–58.5)
Absolute CD 4 Helper: 1585 /uL — ABNORMAL HIGH (ref 359–1519)
Basophils Absolute: 0 x10E3/uL (ref 0.0–0.2)
Basos: 1 %
CD3+CD4+ Cells/CD3+CD8+ Cells Bld: 4.13 — ABNORMAL HIGH (ref 0.92–3.72)
CD3+CD8+ Cells # Bld: 384 /uL (ref 109–897)
CD3+CD8+ Cells NFr Bld: 16.7 % (ref 12.0–35.5)
EOS (ABSOLUTE): 0.2 x10E3/uL (ref 0.0–0.4)
Eos: 3 %
Hematocrit: 36 % (ref 34.0–46.6)
Hemoglobin: 11.9 g/dL (ref 11.1–15.9)
Immature Grans (Abs): 0.1 x10E3/uL (ref 0.0–0.1)
Immature Granulocytes: 1 %
Lymphocytes Absolute: 2.3 x10E3/uL (ref 0.7–3.1)
Lymphs: 43 %
MCH: 32.2 pg (ref 26.6–33.0)
MCHC: 33.1 g/dL (ref 31.5–35.7)
MCV: 97 fL (ref 79–97)
Monocytes Absolute: 0.6 x10E3/uL (ref 0.1–0.9)
Monocytes: 10 %
Neutrophils Absolute: 2.2 x10E3/uL (ref 1.4–7.0)
Neutrophils: 42 %
Platelets: 273 x10E3/uL (ref 150–450)
RBC: 3.7 x10E6/uL — ABNORMAL LOW (ref 3.77–5.28)
RDW: 13.1 % (ref 11.7–15.4)
WBC: 5.3 x10E3/uL (ref 3.4–10.8)

## 2024-02-18 LAB — HIV-1 RNA QUANT-NO REFLEX-BLD
HIV 1 RNA Quant: <20 {copies}/mL
LOG10 HIV-1 RNA: UNDETERMINED {Log_copies}/mL

## 2024-02-18 LAB — RPR: RPR Ser Ql: NONREACTIVE

## 2024-02-21 ENCOUNTER — Other Ambulatory Visit: Payer: Self-pay | Admitting: Nurse Practitioner

## 2024-02-21 DIAGNOSIS — Z8673 Personal history of transient ischemic attack (TIA), and cerebral infarction without residual deficits: Secondary | ICD-10-CM

## 2024-02-21 DIAGNOSIS — G629 Polyneuropathy, unspecified: Secondary | ICD-10-CM

## 2024-02-21 LAB — QUANTIFERON-TB GOLD PLUS (RQFGPL)
QuantiFERON Mitogen Value: >10 [IU]/mL
QuantiFERON Nil Value: 0.08 [IU]/mL
QuantiFERON TB1 Ag Value: 0.09 [IU]/mL
QuantiFERON TB2 Ag Value: 0.09 [IU]/mL

## 2024-02-21 LAB — QUANTIFERON-TB GOLD PLUS: QuantiFERON-TB Gold Plus: NEGATIVE

## 2024-02-22 ENCOUNTER — Ambulatory Visit: Admission: RE | Admit: 2024-02-22 | Source: Ambulatory Visit

## 2024-02-24 ENCOUNTER — Encounter

## 2024-02-25 ENCOUNTER — Ambulatory Visit: Admitting: Nurse Practitioner

## 2024-02-25 NOTE — Progress Notes (Deleted)
   Established Patient Office Visit  Subjective   Patient ID: Molly Dennis, female    DOB: 05/26/1971  Age: 53 y.o. MRN: [id number]  No chief complaint on file.   HPI  Mood disorder: Patient currently maintained on amitriptyline  50 mg, Abilify  10 mg, Strattera  60 mg, duloxetine  60 mg, quetiapine  400 mg  Seizures: Patient currently maintained on Keppra  500 mg twice daily  Neuropathy: Patient currently maintained on gabapentin  300 mg 3 times daily  COPD: Patient currently maintained on Advair 250-50 and albuterol  inhaler as needed  HIV: Patient currently maintained on Biktarvy  and followed by infectious disease  GERD patient currently maintained on omeprazole  20 mg daily  for complete physical and follow up of chronic conditions.  Immunizations: -Tetanus: Completed in 2022 -Influenza: Season -Shingles: # -Pneumonia: Completed Prevnar 20  Diet: Fair diet.  Exercise: No regular exercise.  Eye exam: Completes annually  Dental exam: Completes semi-annually    Colonoscopy: Cologuard ordered but never completed Lung Cancer Screening: Ordered but not completed  Pap smear:?  DEXA scan: Ordered but not completed  Mammogram: Ordered but not completed  Sleep:    {History (Optional):23778}  ROS    Objective:     LMP  (LMP Unknown)  {Vitals History (Optional):23777}  Physical Exam   No results found for any visits on 02/25/24.  {Labs (Optional):23779}  The ASCVD Risk score (Arnett DK, et al., 2019) failed to calculate for the following reasons:   Risk score cannot be calculated because patient has a medical history suggesting prior/existing ASCVD    Assessment & Plan:   Problem List Items Addressed This Visit   None   No follow-ups on file.    Adina Crandall, NP

## 2024-02-29 ENCOUNTER — Other Ambulatory Visit: Payer: Self-pay | Admitting: Nurse Practitioner

## 2024-02-29 ENCOUNTER — Ambulatory Visit: Payer: Self-pay

## 2024-03-01 ENCOUNTER — Telehealth: Payer: Self-pay | Admitting: *Deleted

## 2024-03-01 NOTE — Telephone Encounter (Signed)
 Copied from CRM [id number]. Topic: Appointments - Appointment Scheduling >> Mar 01, 2024  4:28 PM Delon DASEN wrote: Patient needs to schedule annual physical and med refills- [phone]

## 2024-03-02 NOTE — Telephone Encounter (Signed)
 Lvmtcb, no mychart set up to send message. Let pt know in vm, that Adina is booked out to October for cpe slots.

## 2024-03-21 ENCOUNTER — Other Ambulatory Visit: Payer: Self-pay | Admitting: Nurse Practitioner

## 2024-03-21 DIAGNOSIS — G629 Polyneuropathy, unspecified: Secondary | ICD-10-CM

## 2024-03-21 DIAGNOSIS — Z8673 Personal history of transient ischemic attack (TIA), and cerebral infarction without residual deficits: Secondary | ICD-10-CM

## 2024-03-23 ENCOUNTER — Ambulatory Visit: Admission: RE | Admit: 2024-03-23 | Source: Ambulatory Visit

## 2024-03-30 ENCOUNTER — Ambulatory Visit: Admission: RE | Admit: 2024-03-30 | Source: Ambulatory Visit

## 2024-04-04 ENCOUNTER — Ambulatory Visit: Admitting: Infectious Diseases

## 2024-04-13 ENCOUNTER — Ambulatory Visit: Admitting: Nurse Practitioner

## 2024-04-13 NOTE — Progress Notes (Deleted)
   Established Patient Office Visit  Subjective   Patient ID: Verma Grothaus, female    DOB: Sep 12, 1970  Age: 53 y.o. MRN: [id number]  No chief complaint on file.   HPI  Mood: Patient currently maintained on amitriptyline  50 mg nightly, Abilify  10 mg, Strattera  60 mg, duloxetine  60 mg, Q Tafeen 400 mg twice daily.  HLD: Currently maintained on atorvastatin  40 mg daily for ASCVD risk reduction  Lung disease: Maintained on albuterol  as needed and Advair  HIV: Currently maintained on Biktarvy  daily and followed by ID  Seizures: Currently maintained on levetiracetam  500 mg twice daily  GERD: Currently maintained on omeprazole  20 mg daily  Neuropathy: Currently maintained on gabapentin  300 mg 3 times daily  for complete physical and follow up of chronic conditions.  Immunizations: -Tetanus: Completed in 2022 -Influenza:  -Shingles: Needs updating -Pneumonia: Completed 2024  Diet: Fair diet.  Exercise: No regular exercise.  Eye exam: Completes annually  Dental exam: Completes semi-annually    Colonoscopy: Cologuard ordered on 08/27/2023 has not been completed Lung Cancer Screening: Ordered  Pap smear: Due  Mammogram: Ordered but not completed  DEXA:  Sleep:   {History (Optional):23778}  ROS    Objective:     LMP  (LMP Unknown)  {Vitals History (Optional):23777}  Physical Exam   No results found for any visits on 04/13/24.  {Labs (Optional):23779}  The ASCVD Risk score (Arnett DK, et al., 2019) failed to calculate for the following reasons:   Risk score cannot be calculated because patient has a medical history suggesting prior/existing ASCVD    Assessment & Plan:   Problem List Items Addressed This Visit   None   No follow-ups on file.    Adina Crandall, NP

## 2024-04-18 ENCOUNTER — Other Ambulatory Visit: Payer: Self-pay | Admitting: Infectious Diseases

## 2024-04-18 ENCOUNTER — Other Ambulatory Visit: Payer: Self-pay | Admitting: Family

## 2024-04-18 DIAGNOSIS — G629 Polyneuropathy, unspecified: Secondary | ICD-10-CM

## 2024-04-18 DIAGNOSIS — Z8673 Personal history of transient ischemic attack (TIA), and cerebral infarction without residual deficits: Secondary | ICD-10-CM

## 2024-04-19 ENCOUNTER — Other Ambulatory Visit: Payer: Self-pay | Admitting: Nurse Practitioner

## 2024-04-20 ENCOUNTER — Ambulatory Visit: Admitting: Infectious Diseases

## 2024-04-25 ENCOUNTER — Ambulatory Visit: Payer: Self-pay

## 2024-04-25 ENCOUNTER — Other Ambulatory Visit: Payer: Self-pay | Admitting: Nurse Practitioner

## 2024-04-25 DIAGNOSIS — R569 Unspecified convulsions: Secondary | ICD-10-CM

## 2024-04-25 NOTE — Telephone Encounter (Signed)
 Reason for Disposition . [1] Seizure lasting < 5 minutes AND [2] history of prior seizure(s) AND [3] taking antiseizure medicine (anticonvulsant)  Answer Assessment - Initial Assessment Questions Patient called to make an appointment to see provider. Patient reports having a seizure three to four days ago. Very quick to get off of the phone and wouldn't give much detail. Was able to make an acute appointment on Thursday 9/25 at 4:00 PM-patient unsure if she will be able to get there due to transportation.   1. ONSET: When did the seizure occur?     Reports a seizure three to four days ago 2. DURATION: How long did the seizure last (or how long has it been happening)? (e.g., seconds, minutes)  Note: Most seizures last less than 5 minutes.     Last two minutes 3. DESCRIPTION: Describe what happened during the seizure. Did the body become stiff? Was there any jerking?  Did they lose consciousness during the seizure?     Was sitting down when it happened-reports hands shaking 4. CIRCUMSTANCE: What was the person doing when the seizure began?      Was sitting down 5. MENTAL STATUS AFTER SEIZURE: Does the person seem more groggy or sleepy? Does the person know who they are, who you are, and where they are now?      Patient felt sleepy afterwards.  6. PRIOR SEIZURES: Has the person had a seizure (convulsion) before? (e.g., epilepsy, other cause)  If Yes, ask: When was the last time? and What happened last time?      Yes-patient wouldn't give more information 7. EPILEPSY: Does the person have epilepsy? Note: Check for medical ID bracelet.     unknown 8. MEDICINES: Does the person take anticonvulsant medications? (e.g., Yes, No; missed doses, any recent changes)     Keppra  9. INJURY: Was the person hurt or injured during the seizure? (e.g., hit their head, bit their tongue)     no 10. OTHER SYMPTOMS: Are there any other symptoms? (e.g., fever, headache)        no  Protocols used: [employer]

## 2024-04-25 NOTE — Telephone Encounter (Addendum)
 FYI Only or Action Required?: FYI only for provider.  Patient was last seen in primary care on 08/27/2023 by Wendee Lynwood HERO, NP.  Called Nurse Triage reporting Seizures.  Symptoms began 3-4 days ago-no symptoms currently.  Interventions attempted: Prescription medications: keppra  and Rest, hydration, or home remedies.  Symptoms are: stable.  Triage Disposition: Home Care-patient requested an appointment. Scheduled for an acute visit on Thursday 04/27/2024 at 4:00 PM  Patient/caregiver understands and will follow disposition?: Yes  Copied from CRM #[id number]. Topic: Clinical - Red Word Triage >> Apr 25, 2024 12:51 PM Martinique E wrote: [facility] that prompted transfer to Nurse Triage: Patient had a seizure about 3-4 days ago, has not had symptoms since. Patient stated she was already sitting down when it happened.

## 2024-04-25 NOTE — Telephone Encounter (Signed)
 noted

## 2024-04-27 ENCOUNTER — Ambulatory Visit: Admitting: Nurse Practitioner

## 2024-04-28 ENCOUNTER — Ambulatory Visit: Admission: RE | Admit: 2024-04-28 | Source: Ambulatory Visit

## 2024-05-02 ENCOUNTER — Other Ambulatory Visit: Payer: Self-pay | Admitting: Nurse Practitioner

## 2024-05-02 DIAGNOSIS — J42 Unspecified chronic bronchitis: Secondary | ICD-10-CM

## 2024-05-02 DIAGNOSIS — G629 Polyneuropathy, unspecified: Secondary | ICD-10-CM

## 2024-05-02 DIAGNOSIS — K219 Gastro-esophageal reflux disease without esophagitis: Secondary | ICD-10-CM

## 2024-05-02 DIAGNOSIS — R569 Unspecified convulsions: Secondary | ICD-10-CM

## 2024-05-02 DIAGNOSIS — Z8673 Personal history of transient ischemic attack (TIA), and cerebral infarction without residual deficits: Secondary | ICD-10-CM

## 2024-05-02 NOTE — Telephone Encounter (Unsigned)
 Copied from CRM [id number]. Topic: Clinical - Medication Refill >> May 02, 2024  3:40 PM Aisha D wrote: Medication: albuterol  (VENTOLIN  HFA) 108 (90 Base) MCG/ACT inhaler, atorvastatin  (LIPITOR) 40 MG tablet, fluticasone -salmeterol (ADVAIR) 250-50 MCG/ACT AEPB, gabapentin  (NEURONTIN ) 300 MG capsule, levETIRAcetam  (KEPPRA ) 500 MG tablet, omeprazole  (PRILOSEC) 20 MG capsule   Has the patient contacted their pharmacy? Yes (Agent: If no, request that the patient contact the pharmacy for the refill. If patient does not wish to contact the pharmacy document the reason why and proceed with request.) (Agent: If yes, when and what did the pharmacy advise?)  This is the patient's preferred pharmacy:  [facility] - [place], [place] - [address] [place] [address] Phone: [phone] Fax: [phone]  Is this the correct pharmacy for this prescription? Yes If no, delete pharmacy and type the correct one.   Has the prescription been filled recently? No  Is the patient out of the medication? No  Has the patient been seen for an appointment in the last year OR does the patient have an upcoming appointment? Yes  Can we respond through MyChart? No  Agent: Please be advised that Rx refills may take up to 3 business days. We ask that you follow-up with your pharmacy.

## 2024-05-03 ENCOUNTER — Telehealth: Payer: Self-pay

## 2024-05-03 NOTE — Telephone Encounter (Signed)
 I called and left a voicemail for pt to be scheduled.

## 2024-05-03 NOTE — Telephone Encounter (Signed)
 She does not need a prescription from me. She can call and get it scheduled at the pharmacy

## 2024-05-03 NOTE — Telephone Encounter (Signed)
 Copied from CRM #[id number]. Topic: Clinical - Request for Lab/Test Order >> May 03, 2024  3:17 PM Molly Dennis wrote: Reason for CRM: Patient did not want to schedule an appointment at the office for the shingles shot, she would like for an order to be sent to her pharmacy so she can get it from them. [facility] - [place], [place] - [address]  Phone: [phone] Fax: [phone]

## 2024-05-03 NOTE — Telephone Encounter (Unsigned)
 Copied from CRM #[id number]. Topic: Clinical - Prescription Issue >> May 03, 2024  1:32 PM Chiquita SQUIBB wrote: Reason for CRM: Dawn with [facility] is calling in stating that they have sent a refill request fro atorvastatin  (LIPITOR) 40 MG tablet atleast 4 times with no response, informed her it is currently pending with multiple other medications. She stated the patient is completley out so it is important she gets this as soon as possible.

## 2024-05-03 NOTE — Telephone Encounter (Signed)
 Copied from CRM #[id number]. Topic: Appointments - Scheduling Inquiry for Clinic >> May 02, 2024  3:49 PM Aisha D wrote: Reason for CRM: Pt would like to get an appt scheduled to have the shingles shot completed.    ----------------------------------------------------------------------- From previous Reason for Contact - Medication Question: Reason for CRM: Pt stated that she needs an order submitted to have a shingles

## 2024-05-03 NOTE — Telephone Encounter (Signed)
 Called and spoke with patient. Informed to get shot done at pharmacy. Pt understood and has no questions or concerns.

## 2024-05-04 ENCOUNTER — Ambulatory Visit: Admitting: Infectious Diseases

## 2024-05-04 MED ORDER — ATORVASTATIN CALCIUM 40 MG PO TABS: 40.0000 mg | ORAL_TABLET | Freq: Every day | ORAL | 0 refills | Status: DC

## 2024-05-04 MED ORDER — GABAPENTIN 300 MG PO CAPS: 300.0000 mg | ORAL_CAPSULE | Freq: Three times a day (TID) | ORAL | 2 refills | Status: DC

## 2024-05-04 MED ORDER — ALBUTEROL SULFATE HFA 108 (90 BASE) MCG/ACT IN AERS: INHALATION_SPRAY | RESPIRATORY_TRACT | 2 refills | Status: DC

## 2024-05-04 MED ORDER — OMEPRAZOLE 20 MG PO CPDR: 20.0000 mg | DELAYED_RELEASE_CAPSULE | Freq: Every day | ORAL | 3 refills | Status: DC

## 2024-05-04 MED ORDER — LEVETIRACETAM 500 MG PO TABS: 500.0000 mg | ORAL_TABLET | Freq: Two times a day (BID) | ORAL | 3 refills | Status: AC

## 2024-05-04 MED ORDER — FLUTICASONE-SALMETEROL 250-50 MCG/ACT IN AEPB: 1.0000 | INHALATION_SPRAY | Freq: Two times a day (BID) | RESPIRATORY_TRACT | 3 refills | Status: DC

## 2024-05-04 NOTE — Telephone Encounter (Unsigned)
 Copied from CRM [id number]. Topic: General - Other >> May 04, 2024 12:01 PM Molly Dennis wrote: Reason for CRM: Patient called in stating she has a missed call from the office from today would like a callback

## 2024-05-09 ENCOUNTER — Ambulatory Visit: Admitting: Infectious Diseases

## 2024-05-09 ENCOUNTER — Ambulatory Visit: Admission: RE | Admit: 2024-05-09 | Source: Ambulatory Visit

## 2024-05-11 ENCOUNTER — Telehealth: Payer: Self-pay

## 2024-05-11 NOTE — Telephone Encounter (Signed)
 Received a call from bernarda Pepper with [facility] requesting patient's labs be faxed.  I spoke with the patient and she was ok with labs being faxed. Labs faxed to [phone]

## 2024-05-18 ENCOUNTER — Ambulatory Visit: Admitting: Nurse Practitioner

## 2024-05-18 ENCOUNTER — Ambulatory Visit: Payer: Self-pay

## 2024-05-18 ENCOUNTER — Ambulatory Visit: Admitting: Infectious Diseases

## 2024-05-18 ENCOUNTER — Telehealth: Payer: Self-pay | Admitting: Nurse Practitioner

## 2024-05-18 NOTE — Telephone Encounter (Signed)
 FYI Only or Action Required?: Action required by provider: Pt requesting Robitussin with codeine for her worsened chronic cough. Please reach out to patient, appt 11/4.  Patient was last seen in primary care on 08/27/2023 by Wendee Lynwood HERO, NP.  Called Nurse Triage reporting Cough.  Symptoms began a week ago.  Interventions attempted: OTC medications: Mucinex  and Prescription medications: albuterol , addvair.  Symptoms are: gradually worsening.  Triage Disposition: No disposition on file.  Patient/caregiver understands and will follow disposition?:   Copied from CRM [id number]. Topic: Clinical - Medication Question >> May 18, 2024  4:23 PM Drema MATSU wrote: Reason for CRM: Patient is requesting cough syrup for a cough. 4oz bottle of Robitussin with codeine is what she is needing. She is needing it sent to [facility]. Reason for Disposition  Cough with cold symptoms (e.g., runny nose, postnasal drip, throat clearing)  Answer Assessment - Initial Assessment Questions Chronic cough- semi productive. Mucinex  to help break it up. Mild nasal drainage. States her cough has gotten worse over the last week and felt bad today so she couldn't make it in. She has another appt made for 11/4. She is asking if Robuitussin with Codeine could be called in to help with her cough. Cough fairly constant on the phone. Sounds dry and hacky. Using her inhaler and breathing treatments.  Pharmacist told her she needs something stronger then regular robitussin.  ED/UC/Callback instructions given and understood.  She states she will have her lung test done by her 11/4 appt.   1. ONSET: When did the cough begin?      Chronic cough-  2. SEVERITY: How bad is the cough today?      Worse over the last week  3. SPUTUM: Describe the color of your sputum (e.g., none, dry cough; clear, white, yellow, green)     Occasional  4. HEMOPTYSIS: Are you coughing up any blood? If Yes, ask: How much?  (e.g., flecks, streaks, tablespoons, etc.)     denies 5. DIFFICULTY BREATHING: Are you having difficulty breathing? If Yes, ask: How bad is it? (e.g., mild, moderate, severe)      Asthma/COPD 6. FEVER: Do you have a fever? If Yes, ask: What is your temperature, how was it measured, and when did it start?     denies 7. CARDIAC HISTORY: Do you have any history of heart disease? (e.g., heart attack, congestive heart failure)      DVT 8. LUNG HISTORY: Do you have any history of lung disease?  (e.g., pulmonary embolus, asthma, emphysema)     COPD/Asthma/CIgarettes 9. PE RISK FACTORS: Do you have a history of blood clots? (or: recent major surgery, recent prolonged travel, bedridden)     Denies PE but has hx DVT in arm 10. OTHER SYMPTOMS: Do you have any other symptoms? (e.g., runny nose, wheezing, chest pain)  Protocols used: Cough - Acute Productive-A-AH

## 2024-05-18 NOTE — Telephone Encounter (Signed)
 Patient has had 3 no shows since May of this year with many cancellations in between. Can we send a warning letter about dismissal if she continues no showing appointments

## 2024-05-19 NOTE — Telephone Encounter (Signed)
 Needs office visit prior to prescribing medication.

## 2024-05-23 ENCOUNTER — Ambulatory Visit: Admitting: Infectious Diseases

## 2024-05-24 ENCOUNTER — Ambulatory Visit
Admission: RE | Admit: 2024-05-24 | Discharge: 2024-05-24 | Disposition: A | Discharge status: Home / Self Care | Source: Ambulatory Visit | Attending: Acute Care | Admitting: Acute Care

## 2024-05-24 DIAGNOSIS — F17210 Nicotine dependence, cigarettes, uncomplicated: Secondary | ICD-10-CM | POA: Insufficient documentation

## 2024-05-24 DIAGNOSIS — Z122 Encounter for screening for malignant neoplasm of respiratory organs: Secondary | ICD-10-CM | POA: Diagnosis not present

## 2024-05-24 DIAGNOSIS — Z87891 Personal history of nicotine dependence: Secondary | ICD-10-CM | POA: Diagnosis not present

## 2024-05-29 ENCOUNTER — Telehealth: Payer: Self-pay | Admitting: Acute Care

## 2024-05-29 NOTE — Telephone Encounter (Signed)
 I have spoke with the patient and scheduled her an appt for 11/5 at 2:00pm.  Nothing further needed.

## 2024-05-29 NOTE — Telephone Encounter (Signed)
 Call report LDCT:    IMPRESSION: 1. Lung-RADS 4A, suspicious. Follow up low-dose chest CT without contrast in 3 months (please use the following order, CT CHEST LCS NODULE FOLLOW-UP W/O CM) is recommended. Several solid pulmonary nodules scattered throughout both lungs, largest 9.2 mm in the peripheral right lower lobe. 2. Small hiatal hernia. 3. Aortic Atherosclerosis (ICD10-I70.0) and Emphysema (ICD10-J43.9).   These results will be called to the ordering clinician or representative by the Radiologist Assistant, and communication documented in the PACS or [employer].     Electronically Signed   By: Selinda DELENA Blue M.D.   On: 05/28/2024 10:45

## 2024-05-29 NOTE — Telephone Encounter (Signed)
 I have called the patient with the results of her low dose Ct Chest. Her scan was read as a LR 4 A, new 9/2 mm nodule, however the morphology of the nodule is concerning, and the Molly Dennis Pulmonologist's who I asked to review this scan prefer to see the patient in the clinic. I explained this to Ms. Molly Dennis. I have messaged Molly Dennis, CMA [place] requesting that she  get patient  scheduled with one of the providers in the office to be seen. Pt. Verbalized understanding of the above  .  Ladies, This patient will be seen in the [place] office. Please let her PCP know plan.   [facility] Providers, please let us  know the outcome so we can follow up and get her back into the screening program if appropriate.  Thanks so much.

## 2024-05-30 NOTE — Telephone Encounter (Signed)
Results/plan faxed to PCP 

## 2024-06-01 ENCOUNTER — Ambulatory Visit: Admitting: Nurse Practitioner

## 2024-06-06 ENCOUNTER — Ambulatory Visit: Admitting: Nurse Practitioner

## 2024-06-06 ENCOUNTER — Telehealth: Payer: Self-pay | Admitting: Nurse Practitioner

## 2024-06-06 NOTE — Telephone Encounter (Signed)
 Patient has had 3 no shows since September. She is scheduled today too. Can we send a letter about her no shows

## 2024-06-07 ENCOUNTER — Ambulatory Visit: Admitting: Pulmonary Disease

## 2024-06-09 ENCOUNTER — Encounter: Payer: Self-pay | Admitting: Nurse Practitioner

## 2024-06-16 ENCOUNTER — Ambulatory Visit: Payer: Self-pay

## 2024-06-16 ENCOUNTER — Telehealth: Payer: Self-pay | Admitting: Nurse Practitioner

## 2024-06-16 NOTE — Telephone Encounter (Signed)
 Unable to convert patient call telephone encounter to nurse triage encounter. Signing to close. See Nurse triage today 2:16pm

## 2024-06-16 NOTE — Telephone Encounter (Signed)
 FYI Only or Action Required?: Action required by provider: clinical question for provider and patient requesting a call back about getting a prescription for cough medication.  Patient was last seen in primary care on 08/27/2023 by Wendee Lynwood HERO, NP.  Called Nurse Triage reporting Cough.  Symptoms began several weeks ago.  Interventions attempted: OTC medications: Mucinex  and Rest, hydration, or home remedies.  Symptoms are: unchanged.  Triage Disposition: See HCP Within 4 Hours (Or PCP Triage)  Patient/caregiver understands and will follow disposition?: No, wishes to speak with PCP  Copied from CRM #[id number]. Topic: Clinical - Red Word Triage >> Jun 16, 2024 12:17 PM Emylou G wrote: [facility] that prompted transfer to Nurse Triage: bronchitis  -- caller has disconnected call twice.. she is coughing really bad Reason for Disposition  [1] MILD difficulty breathing (e.g., minimal/no SOB at rest, SOB with walking, pulse < 100) AND [2] still present when not coughing  Answer Assessment - Initial Assessment Questions Patient called back-patient questioning when her next appointment is. Appointment date and time are reviewed with patient. Patient endorses three weeks of nonproductive cough that is moderate in severity. Patient is asking if she could get a prescription for cough medication. Reports she has been using Mucinex  with minimal improvement.   1. ONSET: When did the cough begin?      3 weeks ago 2. SEVERITY: How bad is the cough today?      moderate 3. SPUTUM: Describe the color of your sputum (e.g., none, dry cough; clear, white, yellow, green)     dry 4. HEMOPTYSIS: Are you coughing up any blood? If Yes, ask: How much? (e.g., flecks, streaks, tablespoons, etc.)     no 5. DIFFICULTY BREATHING: Are you having difficulty breathing? If Yes, ask: How bad is it? (e.g., mild, moderate, severe)      At times 6. FEVER: Do you have a fever? If Yes, ask: What is your  temperature, how was it measured, and when did it start?     no 7. CARDIAC HISTORY: Do you have any history of heart disease? (e.g., heart attack, congestive heart failure)      yes 8. LUNG HISTORY: Do you have any history of lung disease?  (e.g., pulmonary embolus, asthma, emphysema)     yes 9. PE RISK FACTORS: Do you have a history of blood clots? (or: recent major surgery, recent prolonged travel, bedridden)     no 10. OTHER SYMPTOMS: Do you have any other symptoms? (e.g., runny nose, wheezing, chest pain)       Runny nose 12. TRAVEL: Have you traveled out of the country in the last month? (e.g., travel history, exposures)       no  Protocols used: Cough - Acute Non-Productive-A-AH

## 2024-06-16 NOTE — Telephone Encounter (Signed)
 She can use either robitussin over the counter or delsym to help the cough. She will need to be seen for any prescriptions

## 2024-06-16 NOTE — Telephone Encounter (Signed)
    Copied from CRM [id number]. Topic: Clinical - Red Word Triage >> Jun 16, 2024  2:00 PM Nathanel DEL wrote: Red Word that prompted transfer to Nurse Triage: pt called to schedule appt in [place].  Pt not est yet, and has had several no shows. But she reports SOB, (she has COPD), bronchitis, and a really bad cough.  During our conversation, she said my primary dr is calling now, let me talk to him and she hung up. She was triaged today for her pcp Reason for Disposition  [1] MILD difficulty breathing (e.g., minimal/no SOB at rest, SOB with walking, pulse < 100) AND [2] still present when not coughing  Answer Assessment - Initial Assessment Questions 1) Requesting new pt appt in [facility] [place] for chronic Bronchitis 2) Albuterol  and nebulizer with minimal relief.  3) Mucinex  without relief.  4) Triaged by primary care today, aware of recommendation by pcp. She plans to purchase robitussin today as advised.   Caller transferred to PAS to schedule appointment to establish care.    1. ONSET: When did the cough begin?      Few weeks  2. SEVERITY: How bad is the cough today?      Severe  3. SPUTUM: Describe the color of your sputum (e.g., none, dry cough; clear, white, yellow, green)      4. HEMOPTYSIS: Are you coughing up any blood? If Yes, ask: How much? (e.g., flecks, streaks, tablespoons, etc.)      5. DIFFICULTY BREATHING: Are you having difficulty breathing? If Yes, ask: How bad is it? (e.g., mild, moderate, severe)      Shortness of breath  6. FEVER: Do you have a fever? If Yes, ask: What is your temperature, how was it measured, and when did it start?      7. CARDIAC HISTORY: Do you have any history of heart disease? (e.g., heart attack, congestive heart failure)       8. LUNG HISTORY: Do you have any history of lung disease?  (e.g., pulmonary embolus, asthma, emphysema)     COPD bronchitis  9. PE RISK FACTORS: Do you have a history of blood clots? (or:  recent major surgery, recent prolonged travel, bedridden)      10. OTHER SYMPTOMS: Do you have any other symptoms? (e.g., runny nose, wheezing, chest pain)       Shortness of breath  11. PREGNANCY: Is there any chance you are pregnant? When was your last menstrual period?        12. TRAVEL: Have you traveled out of the country in the last month? (e.g., travel history, exposures)  Protocols used: Cough - Acute Productive-A-AH

## 2024-06-16 NOTE — Telephone Encounter (Signed)
 Called and relayed information with patient. Pt verbalized understanding.  She states she has an appointment set up for Friday.

## 2024-06-16 NOTE — Telephone Encounter (Signed)
 Copied from CRM [id number]. Topic: Clinical - Red Word Triage >> Jun 16, 2024  2:00 PM Nathanel DEL wrote: Red Word that prompted transfer to Nurse Triage: pt called to schedule appt in [place].  Pt not est yet, and has had several no shows. But she reports SOB, (she has COPD), bronchitis, and a really bad cough.  During our conversation, she said my primary dr is calling now, let me talk to him and she hung up. She was triaged today for her pcp

## 2024-06-23 ENCOUNTER — Ambulatory Visit: Admitting: Nurse Practitioner

## 2024-06-27 NOTE — Progress Notes (Signed)
 This encounter was created in error - please disregard.

## 2024-07-05 ENCOUNTER — Ambulatory Visit: Admitting: General Practice

## 2024-07-10 ENCOUNTER — Ambulatory Visit: Payer: Self-pay

## 2024-07-10 NOTE — Telephone Encounter (Signed)
 Spoke with pt offering first available OV with an available provider. Pt declines stating she needs at least a 3-day window to set up a ride. Pt schedule OV on 07/13/24 at 4:00 w/ Matt. Fyi to [place].

## 2024-07-10 NOTE — Telephone Encounter (Addendum)
 Patient refused ER but wants an office appointment at her PCP She wants someone to contact her back because she will have to arrange for transportation. Spoke with Tillman at [facility] and advised her of patient's ER Refusal   FYI Only or Action Required?: Action required by provider: request for appointment, clinical question for provider, update on patient condition, and ER Refusal.  Patient was last seen in primary care on 08/27/2023 by Wendee Lynwood HERO, NP.  Called Nurse Triage reporting Cough.  Symptoms began months ago per patient but worse in the past few days.  Interventions attempted: OTC medications: Mucinex  and Rest, hydration, or home remedies.  Symptoms are: gradually worsening.  Triage Disposition: Go to ED Now (Notify PCP)  Patient/caregiver understands and will follow disposition?: No, wishes to speak with PCP               Copied from CRM [id number]. Topic: Clinical - Red Word Triage >> Jul 10, 2024  3:02 PM Alfonso ORN wrote: Red Word that prompted transfer to Nurse Triage: pt called to reschedule because bronchitis gotten worse Reason for Disposition  [1] MODERATE difficulty breathing (e.g., speaks in phrases, SOB even at rest, pulse 100-120) AND [2] still present when not coughing  Answer Assessment - Initial Assessment Questions Patient states sick for a few months but states these new worsening symptoms within the past few days She states she has been experiencing wheezing, increased shortness of breath, rib pain, and pain when taking a deep breath  Patient is advised ER at this time for evaluation of her symptoms and medical history She refuses the ER at this time and states transportation is an issue Patient is advised to call us  back if anything changes or with any further questions/concerns. Patient is advised that if anything worsens to call 911. Patient verbalized understanding.    3. SPUTUM: Describe the color of your sputum (e.g., none, dry cough;  clear, white, yellow, green)     denies 4. HEMOPTYSIS: Are you coughing up any blood? If Yes, ask: How much? (e.g., flecks, streaks, tablespoons, etc.)     denies 5. DIFFICULTY BREATHING: Are you having difficulty breathing? If Yes, ask: How bad is it? (e.g., mild, moderate, severe)      Patient states  she is short of breath and it's worse than usual 8. LUNG HISTORY: Do you have any history of lung disease?  (e.g., pulmonary embolus, asthma, emphysema)     significant 10. OTHER SYMPTOMS: Do you have any other symptoms? (e.g., runny nose, wheezing, chest pain) Rib pain, pain when taking a deep breath, wheezing,  Protocols used: Cough - Acute Non-Productive-A-AH

## 2024-07-11 NOTE — Telephone Encounter (Signed)
 noted

## 2024-07-13 ENCOUNTER — Ambulatory Visit: Admitting: Family Medicine

## 2024-07-13 ENCOUNTER — Ambulatory Visit: Admitting: Nurse Practitioner

## 2024-07-13 ENCOUNTER — Other Ambulatory Visit: Payer: Self-pay | Admitting: Nurse Practitioner

## 2024-07-18 ENCOUNTER — Ambulatory Visit: Admitting: Pulmonary Disease

## 2024-07-19 ENCOUNTER — Ambulatory Visit: Admitting: Pulmonary Disease

## 2024-07-19 ENCOUNTER — Encounter: Payer: Self-pay | Admitting: Pulmonary Disease

## 2024-07-19 VITALS — BP 118/78 | HR 103 | Temp 98.6°F | Ht 64.5 in | Wt 139.6 lb

## 2024-07-19 DIAGNOSIS — R911 Solitary pulmonary nodule: Secondary | ICD-10-CM | POA: Diagnosis not present

## 2024-07-19 DIAGNOSIS — J4489 Other specified chronic obstructive pulmonary disease: Secondary | ICD-10-CM | POA: Diagnosis not present

## 2024-07-19 DIAGNOSIS — F17210 Nicotine dependence, cigarettes, uncomplicated: Secondary | ICD-10-CM | POA: Diagnosis not present

## 2024-07-19 MED ORDER — TRELEGY ELLIPTA 100-62.5-25 MCG/ACT IN AEPB: 1.0000 | INHALATION_SPRAY | Freq: Every day | RESPIRATORY_TRACT | Status: AC

## 2024-07-19 MED ORDER — IPRATROPIUM-ALBUTEROL 0.5-2.5 (3) MG/3ML IN SOLN: 3.0000 mL | Freq: Four times a day (QID) | RESPIRATORY_TRACT | 3 refills | Status: AC | PRN

## 2024-07-19 MED ORDER — TRELEGY ELLIPTA 100-62.5-25 MCG/ACT IN AEPB: 1.0000 | INHALATION_SPRAY | Freq: Every day | RESPIRATORY_TRACT | 3 refills | Status: AC

## 2024-07-19 NOTE — Progress Notes (Signed)
 Synopsis: Referred in by Molly Lynwood HERO, NP   Subjective:   PATIENT ID: Molly Dennis GENDER: female DOB: [birth year], MRN: [id number]  Chief Complaint  Patient presents with   Lung Mass    Nodule. CT on 05/24/2024. SOB. Wheezing. Cough,dry. Wixela- BID. Albuterol - PRN    HPI Discussed the use of AI scribe software for clinical note transcription with the patient, who gave verbal consent to proceed.  History of Present Illness   Molly Dennis is a 53 year old female who presents with a suspicious lung nodule.  A recent CT scan in October identified a suspicious lung nodule measuring nine millimeters with speculated edges. This is concerning given her history of four previous nodules and her significant smoking history.  She has been smoking since the age of 7 and currently smokes a pack a day. Additionally, she occasionally uses a vape when she runs out of cigarettes. Her family history includes lung cancer in her grandfather and great-grandfather, both of whom were smokers, and breast cancer in her aunt.  She experiences breathing difficulties, particularly when coughing hard, which sometimes requires assistance from her boyfriend to catch her breath. She frequently uses albuterol  and has been on inhalers similar to Advair for years, which she finds helpful. She has also used a nebulizer in the past, though she does not feel it makes a significant difference.  Her current medications include inhalers, possibly Wixela, and she uses albuterol  as needed.        Family History  Problem Relation Age of Onset   Hypertension Mother    Heart disease Mother    Hearing loss Mother    COPD Mother    Asthma Mother    Arthritis Mother    Alcohol abuse Father    Drug abuse Sister    Diabetes Sister    Depression Sister    Drug abuse Sister    Depression Sister    COPD Sister    Asthma Sister    Diabetes Maternal Grandfather    Diabetes Paternal Grandmother    Alcohol  abuse Paternal Grandfather      Social History   Socioeconomic History   Marital status: Divorced    Spouse name: Not on file   Number of children: 0   Years of education: Not on file   Highest education level: Not on file  Occupational History   Not on file  Tobacco Use   Smoking status: Every Day    Current packs/day: 2.00    Average packs/day: 2.0 packs/day for 36.0 years (72.0 ttl pk-yrs)    Types: Cigarettes   Smokeless tobacco: Never   Tobacco comments:    Smokes 1 PPD a day- khj 07/19/2024        Started smoking at 53 years old    Smoked 3 PPD at her heaviest  Vaping Use   Vaping status: Former  Substance and Sexual Activity   Alcohol use: Not Currently    Comment: none since going into SNF 01-2021. 2023: sips of white liquor   Drug use: Yes    Types: Marijuana, Cocaine    Comment: goes to [employer]   Sexual activity: Not Currently  Other Topics Concern   Not on file  Social History Narrative   Not currenlty employed   Social Drivers of Health   Tobacco Use: High Risk (07/19/2024)   Patient History    Smoking Tobacco Use: Every Day    Smokeless Tobacco Use: Never  Passive Exposure: Not on file  Financial Resource Strain: Not on file  Food Insecurity: Not on file  Transportation Needs: Not on file  Physical Activity: Not on file  Stress: Not on file  Social Connections: Not on file  Intimate Partner Violence: Not on file  Depression (PHQ2-9): Low Risk (02/17/2024)   Depression (PHQ2-9)    PHQ-2 Score: 0  Alcohol Screen: Not on file  Housing: Not on file  Utilities: Not on file  Health Literacy: Not on file        Objective:   Vitals:   07/19/24 1131  BP: 118/78  Pulse: (!) 103  Temp: 98.6 F (37 C)  SpO2: 96%  Weight: 139 lb 9.6 oz (63.3 kg)  Height: 5' 4.5 (1.638 m)   96% on RA BMI Readings from Last 3 Encounters:  07/19/24 23.59 kg/m  02/24/24 22.98 kg/m  08/27/23 23.07 kg/m   Wt Readings from Last 3  Encounters:  07/19/24 139 lb 9.6 oz (63.3 kg)  02/24/24 136 lb (61.7 kg)  08/27/23 136 lb 8 oz (61.9 kg)    Physical Exam GEN: NAD, Healthy Appearing HEENT: Supple Neck, Reactive Pupils, EOMI  CVS: Normal S1, Normal S2, RRR, No murmurs or ES appreciated  Lungs: Poor air movement bilaterally Abdomen: Soft, non tender, non distended, + BS  Extremities: Warm and well perfused, No edema   Labs and imaging were reviewed,.  Ancillary Information   CBC    Component Value Date/Time   WBC 5.5 02/17/2024 1144   WBC 5.3 02/17/2024 1144   RBC 3.66 (L) 02/17/2024 1144   RBC 3.70 (L) 02/17/2024 1144   HGB 11.8 (L) 02/17/2024 1144   HGB 11.9 02/17/2024 1144   HCT 35.9 (L) 02/17/2024 1144   HCT 36.0 02/17/2024 1144   PLT 264 02/17/2024 1144   PLT 273 02/17/2024 1144   MCV 98.1 02/17/2024 1144   MCV 97 02/17/2024 1144   MCH 32.2 02/17/2024 1144   MCH 32.2 02/17/2024 1144   MCHC 32.9 02/17/2024 1144   MCHC 33.1 02/17/2024 1144   RDW 13.0 02/17/2024 1144   RDW 13.1 02/17/2024 1144   LYMPHSABS 2.4 02/17/2024 1144   LYMPHSABS 2.3 02/17/2024 1144   MONOABS 0.6 02/17/2024 1144   EOSABS 0.2 02/17/2024 1144   EOSABS 0.2 02/17/2024 1144   BASOSABS 0.0 02/17/2024 1144   BASOSABS 0.0 02/17/2024 1144         No data to display           Assessment & Plan:  Assessment and Plan    #Suspicious right lower lobe pulmonary nodule 9 mm nodule in right lower lobe suspicious for malignancy due to size, spiculated edges, smoking history, and family history of lung cancer. - Ordered repeat CT scan with PET scan in early January. - Scheduled biopsy for mid-January, contingent on imaging results. - Will cancel biopsy if PET and CT show nodule decrease in size or disappears. - Will proceed with biopsy if PET and CT show nodule remains or increases in size.  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  COPD managed with inhalers. Frequent albuterol  use and  interest in Trelegy. Smoking and vaping contribute to progression. - Discontinued Advair. - Prescribed Trelegy once daily. - Ordered pulmonary function test. - Prescribed nebulizer solution every six hours as needed.  Smoking/Tobacco Cessation Counseling Molly Dennis is a current user of tobacco or nicotine  products. She is considering quitting at this time. Counseling provided today addressed the risks of continued  use and the benefits of cessation. Discussed tobacco/nicotine  use history, readiness to quit, and evidence-based treatment options including behavioral strategies, support resources, and pharmacologic therapies. Provided encouragement and educational materials on steps and resources to quit smoking. Patient questions were addressed, and follow-up recommended for continued support. Total time spent on counseling: 4 minutes.          Return in about 4 months (around 11/17/2024).  I personally spent a total of 60 minutes in the care of the patient today including preparing to see the patient, getting/reviewing separately obtained history, performing a medically appropriate exam/evaluation, counseling and educating, placing orders, documenting clinical information in the EHR, independently interpreting results, and communicating results.   Darrin Barn, MD [facility] 07/19/2024 12:51 PM

## 2024-08-01 ENCOUNTER — Ambulatory Visit: Admitting: Infectious Diseases

## 2024-08-08 ENCOUNTER — Ambulatory Visit

## 2024-08-08 ENCOUNTER — Ambulatory Visit: Payer: Self-pay | Admitting: Pulmonary Disease

## 2024-08-08 ENCOUNTER — Other Ambulatory Visit: Payer: Self-pay | Admitting: Nurse Practitioner

## 2024-08-08 ENCOUNTER — Ambulatory Visit: Payer: Self-pay

## 2024-08-08 ENCOUNTER — Ambulatory Visit: Admission: RE | Admit: 2024-08-08 | Source: Ambulatory Visit

## 2024-08-08 DIAGNOSIS — G629 Polyneuropathy, unspecified: Secondary | ICD-10-CM

## 2024-08-08 DIAGNOSIS — Z8673 Personal history of transient ischemic attack (TIA), and cerebral infarction without residual deficits: Secondary | ICD-10-CM

## 2024-08-08 NOTE — Telephone Encounter (Signed)
 Please see note, unable to complete triage, pt states will call back.  Reason for Disposition  History of asthma or has mild wheezing  Answer Assessment - Initial Assessment Questions Pt reports chronic cough x 2-3 months. States was seen by pulm and dx with bronchitis. States no improvement. Has been using Trelegy 1 puff daily with some improvement. States has albuterol  inhaler but that it does not help. Using duoneb PRN. Pt states she did not go to her imaging appt today. Pt refused pulmonology appt, states she was only calling to schedule a pcp visit for routine care and that she will call back to schedule with pulmonology.   While attempting to schedule routine PCP appt, pt stated she has to go and will call back.  Unable to complete remainder of pulm triage as pt disconnected.  Protocols used: Cough - Chronic-A-AH  Copied from CRM #[id number]. Topic: Clinical - Red Word Triage >> Aug 08, 2024  3:44 PM Harlene ORN wrote: Red Word that prompted transfer to Nurse Triage: bronchitis and shortness of breath

## 2024-08-08 NOTE — Telephone Encounter (Signed)
 FYI Only or Action Required?: FYI only for provider: appointment scheduled on 2/12.  Patient was last seen in primary care on 08/27/2023 by Wendee Lynwood HERO, NP.   Copied from CRM [id number]. Topic: Clinical - Red Word Triage >> Aug 08, 2024  3:44 PM Harlene ORN wrote: Red Word that prompted transfer to Nurse Triage: bronchitis and shortness of breath >> Aug 08, 2024  3:58 PM Donna BRAVO wrote: Patient is calling back Reason for Disposition  [1] Other NON-URGENT information for PCP AND [2] does not require PCP response  Answer Assessment - Initial Assessment Questions 1. REASON FOR CALL or QUESTION: What is your reason for calling today? or How can I best     Pt wanting to schedule a well visit and get a flu shot.   2. CALLER: Document the source of call. (e.g., laboratory staff, caregiver or patient).     Patient.  Protocols used: PCP Call - No Triage-A-AH

## 2024-08-08 NOTE — Telephone Encounter (Signed)
 Noted

## 2024-08-09 NOTE — Telephone Encounter (Signed)
 noted

## 2024-08-11 ENCOUNTER — Other Ambulatory Visit: Payer: Self-pay | Admitting: Nurse Practitioner

## 2024-08-15 ENCOUNTER — Ambulatory Visit: Admitting: Infectious Diseases

## 2024-08-25 ENCOUNTER — Ambulatory Visit
Admission: RE | Admit: 2024-08-25 | Discharge: 2024-08-25 | Disposition: A | Source: Ambulatory Visit | Attending: Pulmonary Disease

## 2024-08-25 DIAGNOSIS — I700 Atherosclerosis of aorta: Secondary | ICD-10-CM | POA: Insufficient documentation

## 2024-08-25 DIAGNOSIS — Z122 Encounter for screening for malignant neoplasm of respiratory organs: Secondary | ICD-10-CM | POA: Insufficient documentation

## 2024-08-25 DIAGNOSIS — R911 Solitary pulmonary nodule: Secondary | ICD-10-CM | POA: Insufficient documentation

## 2024-08-25 DIAGNOSIS — J439 Emphysema, unspecified: Secondary | ICD-10-CM

## 2024-08-25 DIAGNOSIS — J4489 Other specified chronic obstructive pulmonary disease: Secondary | ICD-10-CM | POA: Insufficient documentation

## 2024-08-25 LAB — GLUCOSE, CAPILLARY: Glucose-Capillary: 112 mg/dL — ABNORMAL HIGH (ref 70–99)

## 2024-08-25 MED ORDER — FLUDEOXYGLUCOSE F - 18 (FDG) INJECTION
7.2000 | Freq: Once | INTRAVENOUS | Status: AC | PRN
  Administered 2024-08-25: 7.9 via INTRAVENOUS

## 2024-08-31 ENCOUNTER — Other Ambulatory Visit: Payer: Self-pay | Admitting: Nurse Practitioner

## 2024-08-31 DIAGNOSIS — K219 Gastro-esophageal reflux disease without esophagitis: Secondary | ICD-10-CM

## 2024-09-14 ENCOUNTER — Ambulatory Visit: Admitting: Nurse Practitioner

## 2024-09-14 ENCOUNTER — Ambulatory Visit
